# Patient Record
Sex: Female | Born: 1960 | Race: White | Hispanic: No | Marital: Married | Smoking: Never smoker
Health system: Southern US, Community
[De-identification: ages and names within clinical notes are randomized; demographics above are authoritative.]

## PROBLEM LIST (undated history)

## (undated) DIAGNOSIS — T4145XA Adverse effect of unspecified anesthetic, initial encounter: Secondary | ICD-10-CM

## (undated) DIAGNOSIS — Z8719 Personal history of other diseases of the digestive system: Secondary | ICD-10-CM

## (undated) DIAGNOSIS — K219 Gastro-esophageal reflux disease without esophagitis: Secondary | ICD-10-CM

## (undated) DIAGNOSIS — R112 Nausea with vomiting, unspecified: Secondary | ICD-10-CM

## (undated) DIAGNOSIS — J4520 Mild intermittent asthma, uncomplicated: Secondary | ICD-10-CM

## (undated) DIAGNOSIS — J189 Pneumonia, unspecified organism: Secondary | ICD-10-CM

## (undated) DIAGNOSIS — Z8489 Family history of other specified conditions: Secondary | ICD-10-CM

## (undated) DIAGNOSIS — K589 Irritable bowel syndrome without diarrhea: Secondary | ICD-10-CM

## (undated) DIAGNOSIS — J3089 Other allergic rhinitis: Secondary | ICD-10-CM

## (undated) DIAGNOSIS — N8189 Other female genital prolapse: Secondary | ICD-10-CM

## (undated) DIAGNOSIS — Z87442 Personal history of urinary calculi: Secondary | ICD-10-CM

## (undated) DIAGNOSIS — Z9889 Other specified postprocedural states: Secondary | ICD-10-CM

## (undated) DIAGNOSIS — E039 Hypothyroidism, unspecified: Secondary | ICD-10-CM

## (undated) DIAGNOSIS — Z973 Presence of spectacles and contact lenses: Secondary | ICD-10-CM

## (undated) DIAGNOSIS — IMO0002 Reserved for concepts with insufficient information to code with codable children: Secondary | ICD-10-CM

## (undated) DIAGNOSIS — T8859XA Other complications of anesthesia, initial encounter: Secondary | ICD-10-CM

## (undated) DIAGNOSIS — M1990 Unspecified osteoarthritis, unspecified site: Secondary | ICD-10-CM

## (undated) DIAGNOSIS — R51 Headache: Secondary | ICD-10-CM

## (undated) DIAGNOSIS — N3010 Interstitial cystitis (chronic) without hematuria: Secondary | ICD-10-CM

## (undated) HISTORY — PX: ABDOMINAL HYSTERECTOMY: SHX81

## (undated) HISTORY — PX: TOOTH EXTRACTION: SUR596

---

## 1986-02-19 HISTORY — PX: TUBAL LIGATION: SHX77

## 1997-07-21 ENCOUNTER — Other Ambulatory Visit: Admission: RE | Admit: 1997-07-21 | Discharge: 1997-07-21 | Payer: Self-pay | Admitting: Obstetrics and Gynecology

## 1998-08-01 ENCOUNTER — Other Ambulatory Visit: Admission: RE | Admit: 1998-08-01 | Discharge: 1998-08-01 | Payer: Self-pay | Admitting: Obstetrics and Gynecology

## 1998-11-11 ENCOUNTER — Emergency Department (HOSPITAL_COMMUNITY): Admission: EM | Admit: 1998-11-11 | Discharge: 1998-11-12 | Payer: Self-pay | Admitting: Emergency Medicine

## 1999-07-20 ENCOUNTER — Other Ambulatory Visit: Admission: RE | Admit: 1999-07-20 | Discharge: 1999-07-20 | Payer: Self-pay | Admitting: Obstetrics and Gynecology

## 1999-08-17 ENCOUNTER — Ambulatory Visit (HOSPITAL_COMMUNITY): Admission: RE | Admit: 1999-08-17 | Discharge: 1999-08-17 | Payer: Self-pay | Admitting: Neurosurgery

## 1999-08-17 ENCOUNTER — Encounter: Payer: Self-pay | Admitting: Neurosurgery

## 1999-10-11 ENCOUNTER — Encounter
Admission: RE | Admit: 1999-10-11 | Discharge: 2000-01-09 | Payer: Self-pay | Admitting: Physical Medicine & Rehabilitation

## 2000-01-18 ENCOUNTER — Encounter
Admission: RE | Admit: 2000-01-18 | Discharge: 2000-01-19 | Payer: Self-pay | Admitting: Physical Medicine & Rehabilitation

## 2000-06-26 ENCOUNTER — Encounter: Payer: Self-pay | Admitting: Family Medicine

## 2000-06-26 ENCOUNTER — Encounter: Admission: RE | Admit: 2000-06-26 | Discharge: 2000-06-26 | Payer: Self-pay | Admitting: Family Medicine

## 2000-07-10 ENCOUNTER — Encounter: Admission: RE | Admit: 2000-07-10 | Discharge: 2000-07-10 | Payer: Self-pay | Admitting: Family Medicine

## 2000-07-10 ENCOUNTER — Encounter: Payer: Self-pay | Admitting: Family Medicine

## 2000-07-24 ENCOUNTER — Other Ambulatory Visit: Admission: RE | Admit: 2000-07-24 | Discharge: 2000-07-24 | Payer: Self-pay | Admitting: Obstetrics and Gynecology

## 2000-08-26 ENCOUNTER — Ambulatory Visit (HOSPITAL_COMMUNITY)
Admission: RE | Admit: 2000-08-26 | Discharge: 2000-08-26 | Payer: Self-pay | Admitting: Physical Medicine & Rehabilitation

## 2000-08-27 ENCOUNTER — Ambulatory Visit (HOSPITAL_COMMUNITY)
Admission: RE | Admit: 2000-08-27 | Discharge: 2000-08-27 | Payer: Self-pay | Admitting: Physical Medicine & Rehabilitation

## 2000-08-27 ENCOUNTER — Encounter: Payer: Self-pay | Admitting: Physical Medicine & Rehabilitation

## 2000-09-10 ENCOUNTER — Ambulatory Visit (HOSPITAL_COMMUNITY)
Admission: RE | Admit: 2000-09-10 | Discharge: 2000-09-10 | Payer: Self-pay | Admitting: Physical Medicine & Rehabilitation

## 2000-09-10 ENCOUNTER — Encounter: Payer: Self-pay | Admitting: Physical Medicine & Rehabilitation

## 2000-11-11 ENCOUNTER — Ambulatory Visit (HOSPITAL_COMMUNITY)
Admission: RE | Admit: 2000-11-11 | Discharge: 2000-11-11 | Payer: Self-pay | Admitting: Physical Medicine & Rehabilitation

## 2000-11-11 ENCOUNTER — Encounter: Payer: Self-pay | Admitting: Physical Medicine & Rehabilitation

## 2001-09-11 ENCOUNTER — Encounter: Payer: Self-pay | Admitting: Gastroenterology

## 2001-09-11 ENCOUNTER — Encounter: Admission: RE | Admit: 2001-09-11 | Discharge: 2001-09-11 | Payer: Self-pay | Admitting: Gastroenterology

## 2001-09-16 ENCOUNTER — Ambulatory Visit (HOSPITAL_COMMUNITY): Admission: RE | Admit: 2001-09-16 | Discharge: 2001-09-16 | Payer: Self-pay | Admitting: Gastroenterology

## 2001-09-25 ENCOUNTER — Other Ambulatory Visit: Admission: RE | Admit: 2001-09-25 | Discharge: 2001-09-25 | Payer: Self-pay | Admitting: Obstetrics and Gynecology

## 2001-10-02 ENCOUNTER — Emergency Department (HOSPITAL_COMMUNITY): Admission: EM | Admit: 2001-10-02 | Discharge: 2001-10-02 | Payer: Self-pay | Admitting: Emergency Medicine

## 2001-10-07 ENCOUNTER — Encounter: Payer: Self-pay | Admitting: General Surgery

## 2001-10-10 ENCOUNTER — Ambulatory Visit (HOSPITAL_COMMUNITY): Admission: RE | Admit: 2001-10-10 | Discharge: 2001-10-11 | Payer: Self-pay | Admitting: General Surgery

## 2001-10-10 ENCOUNTER — Encounter: Payer: Self-pay | Admitting: General Surgery

## 2001-10-10 ENCOUNTER — Encounter (INDEPENDENT_AMBULATORY_CARE_PROVIDER_SITE_OTHER): Payer: Self-pay | Admitting: *Deleted

## 2001-10-11 HISTORY — PX: LAPAROSCOPIC CHOLECYSTECTOMY: SUR755

## 2002-03-14 ENCOUNTER — Encounter: Payer: Self-pay | Admitting: Emergency Medicine

## 2002-03-14 ENCOUNTER — Emergency Department (HOSPITAL_COMMUNITY): Admission: EM | Admit: 2002-03-14 | Discharge: 2002-03-14 | Payer: Self-pay | Admitting: Emergency Medicine

## 2002-05-06 ENCOUNTER — Ambulatory Visit (HOSPITAL_COMMUNITY): Admission: RE | Admit: 2002-05-06 | Discharge: 2002-05-06 | Payer: Self-pay | Admitting: Obstetrics and Gynecology

## 2002-05-06 ENCOUNTER — Encounter: Payer: Self-pay | Admitting: Obstetrics and Gynecology

## 2002-07-30 ENCOUNTER — Encounter: Payer: Self-pay | Admitting: Family Medicine

## 2002-07-30 ENCOUNTER — Encounter: Admission: RE | Admit: 2002-07-30 | Discharge: 2002-07-30 | Payer: Self-pay | Admitting: Family Medicine

## 2002-08-12 ENCOUNTER — Encounter: Admission: RE | Admit: 2002-08-12 | Discharge: 2002-11-10 | Payer: Self-pay | Admitting: Family Medicine

## 2003-02-17 ENCOUNTER — Emergency Department (HOSPITAL_COMMUNITY): Admission: EM | Admit: 2003-02-17 | Discharge: 2003-02-17 | Payer: Self-pay | Admitting: Emergency Medicine

## 2003-02-20 HISTORY — PX: FRACTURE SURGERY: SHX138

## 2003-04-08 ENCOUNTER — Other Ambulatory Visit: Admission: RE | Admit: 2003-04-08 | Discharge: 2003-04-08 | Payer: Self-pay | Admitting: Gynecology

## 2003-04-22 ENCOUNTER — Inpatient Hospital Stay (HOSPITAL_COMMUNITY): Admission: RE | Admit: 2003-04-22 | Discharge: 2003-04-24 | Payer: Self-pay | Admitting: Gynecology

## 2003-04-22 ENCOUNTER — Encounter (INDEPENDENT_AMBULATORY_CARE_PROVIDER_SITE_OTHER): Payer: Self-pay | Admitting: *Deleted

## 2003-04-22 HISTORY — PX: OTHER SURGICAL HISTORY: SHX169

## 2003-11-25 ENCOUNTER — Encounter: Admission: RE | Admit: 2003-11-25 | Discharge: 2003-11-25 | Payer: Self-pay | Admitting: Family Medicine

## 2004-05-15 ENCOUNTER — Other Ambulatory Visit: Admission: RE | Admit: 2004-05-15 | Discharge: 2004-05-15 | Payer: Self-pay | Admitting: Obstetrics and Gynecology

## 2004-11-02 ENCOUNTER — Encounter (INDEPENDENT_AMBULATORY_CARE_PROVIDER_SITE_OTHER): Payer: Self-pay | Admitting: Specialist

## 2004-11-02 ENCOUNTER — Observation Stay (HOSPITAL_COMMUNITY): Admission: RE | Admit: 2004-11-02 | Discharge: 2004-11-03 | Payer: Self-pay | Admitting: Obstetrics and Gynecology

## 2004-11-02 HISTORY — PX: LAPAROSCOPIC ASSISTED VAGINAL HYSTERECTOMY: SHX5398

## 2005-06-12 ENCOUNTER — Encounter: Admission: RE | Admit: 2005-06-12 | Discharge: 2005-06-12 | Payer: Self-pay | Admitting: Urology

## 2005-09-25 ENCOUNTER — Ambulatory Visit: Payer: Self-pay | Admitting: Internal Medicine

## 2005-10-19 ENCOUNTER — Ambulatory Visit (HOSPITAL_COMMUNITY): Admission: RE | Admit: 2005-10-19 | Discharge: 2005-10-19 | Payer: Self-pay | Admitting: Internal Medicine

## 2005-10-19 ENCOUNTER — Encounter (INDEPENDENT_AMBULATORY_CARE_PROVIDER_SITE_OTHER): Payer: Self-pay | Admitting: *Deleted

## 2005-10-19 HISTORY — PX: COLONOSCOPY: SHX174

## 2005-10-23 ENCOUNTER — Ambulatory Visit: Payer: Self-pay | Admitting: Internal Medicine

## 2005-12-10 ENCOUNTER — Ambulatory Visit: Payer: Self-pay | Admitting: Internal Medicine

## 2005-12-19 ENCOUNTER — Encounter: Payer: Self-pay | Admitting: Internal Medicine

## 2006-05-29 ENCOUNTER — Encounter: Admission: RE | Admit: 2006-05-29 | Discharge: 2006-05-29 | Payer: Self-pay | Admitting: Obstetrics and Gynecology

## 2006-06-19 ENCOUNTER — Ambulatory Visit: Payer: Self-pay | Admitting: Oncology

## 2007-05-30 ENCOUNTER — Encounter: Admission: RE | Admit: 2007-05-30 | Discharge: 2007-05-30 | Payer: Self-pay | Admitting: Obstetrics and Gynecology

## 2007-06-06 ENCOUNTER — Encounter: Admission: RE | Admit: 2007-06-06 | Discharge: 2007-06-06 | Payer: Self-pay | Admitting: Obstetrics and Gynecology

## 2008-06-16 ENCOUNTER — Encounter: Admission: RE | Admit: 2008-06-16 | Discharge: 2008-06-16 | Payer: Self-pay | Admitting: Obstetrics and Gynecology

## 2008-10-26 ENCOUNTER — Telehealth: Payer: Self-pay | Admitting: Internal Medicine

## 2010-02-17 ENCOUNTER — Emergency Department (HOSPITAL_BASED_OUTPATIENT_CLINIC_OR_DEPARTMENT_OTHER)
Admission: EM | Admit: 2010-02-17 | Discharge: 2010-02-17 | Payer: Self-pay | Source: Home / Self Care | Admitting: Emergency Medicine

## 2010-03-12 ENCOUNTER — Encounter: Payer: Self-pay | Admitting: Obstetrics and Gynecology

## 2010-03-23 NOTE — Progress Notes (Signed)
Summary: ? re col   Phone Note Call from Patient Call back at Home Phone [phone]   Caller: Patient Call For: Leone Payor Reason for Call: Talk to Nurse Summary of Call: Patient has questions regarding the last colon, she wants to know when was it done (it does not show in the system) Initial call taken by: Tawni Levy,  October 26, 2008 10:33 AM  Follow-up for Phone Call        patient notfied last colon was 12-19-2005. Follow-up by: Darcey Nora RN, CGRN,  October 26, 2008 11:18 AM

## 2010-03-23 NOTE — Procedures (Signed)
Summary: Colonoscopy   Colonoscopy  Procedure date:  12/19/2005  Findings:      Location:  [facility].    Procedures Next Due Date:    Colonoscopy: 12/2015 Patient Name: Deborah Callahan, Deborah Callahan MRN:  Procedure Procedures: Colonoscopy CPT: 16606.    with biopsy. CPT: Q5068410.  Personnel: Endoscopist: Iva Boop, MD, [facility]  Referred By: Marcelyn Bruins, MD.  Exam Location: Exam performed in Endoscopy Suite. Outpatient  Patient Consent: Procedure, Alternatives, Risks and Benefits discussed, consent obtained, from patient. Consent was obtained by the RN.  Indications Symptoms: Diarrhea Change in bowel habits. Rectal Bleeding.  History  Current Medications: Patient is not currently taking Coumadin.  Pre-Exam Physical: Performed Oct 19, 2005. Cardio-pulmonary exam WNL. Rectal exam abnormal. HEENT exam , Abdominal exam, Mental status exam WNL. Abnormal PE findings include: tender rectal exam.  Comments: Pt. history reviewed/updated, physical exam performed prior to initiation of sedation? Exam Exam: Extent of exam reached: Terminal Ileum, extent intended: Terminal Ileum.  The cecum was identified by appendiceal orifice and IC valve. Patient position: on left side. Colon retroflexion performed. Images taken. ASA Classification: I. Tolerance: good.  Monitoring: Pulse and BP monitoring, Oximetry used. Supplemental O2 given.  Colon Prep Prep results: excellent.  Fluoroscopy: Fluoroscopy was not used.  Sedation Meds: Patient assessed and found to be appropriate for moderate (conscious) sedation. Fentanyl 100 mcg. given IV. Versed 10 mg. given IV.  Findings NORMAL EXAM: Terminal Ileum. Biopsy/Normal Exam taken. Comments: nodules biopsies, suspct lymphoid hyperplasia (normal).  - NORMAL EXAM: Cecum to Sigmoid Colon. Comments: VERY SENSITIVE TO SCOPE IN SIGMOID.  HEMORRHOIDS: External. ICD9: Hemorrhoids, External: 455.3. Comments: OLD, NOT ACTIVE, NO OBVIOUS FISSURE.    Assessment  Diagnoses: 455.3: Hemorrhoids, External.   Comments: 1) NO POLYPS OR CANCER SEEN 2) STRONG IBS RESPONSE IN SIGMOID 3) OLD EXTERNAL HEMORRHOIDS  Events  Unplanned Interventions: No intervention was required.  Plans Patient Education: Patient given standard instructions for: Patient instructed to get routine colonoscopy every 10 years.  Comments: TRIAL OF DILTIAZEM GEL TO ANUS FOR RECTAL PAIN Disposition: After procedure patient sent to recovery.  Scheduling/Referral: Call office for appointment, to Iva Boop, MD, [facility], [address],

## 2010-04-10 ENCOUNTER — Other Ambulatory Visit: Payer: Self-pay | Admitting: Orthopedic Surgery

## 2010-04-10 DIAGNOSIS — M25539 Pain in unspecified wrist: Secondary | ICD-10-CM

## 2010-04-18 ENCOUNTER — Ambulatory Visit
Admission: RE | Admit: 2010-04-18 | Discharge: 2010-04-18 | Disposition: A | Discharge status: Home / Self Care | Payer: BC Managed Care – PPO | Source: Ambulatory Visit | Attending: Orthopedic Surgery | Admitting: Orthopedic Surgery

## 2010-04-18 DIAGNOSIS — M25539 Pain in unspecified wrist: Secondary | ICD-10-CM

## 2010-05-01 LAB — BASIC METABOLIC PANEL
BUN: 10 mg/dL (ref 6–23)
CO2: 25 mEq/L (ref 19–32)
Calcium: 9.1 mg/dL (ref 8.4–10.5)
Chloride: 104 mEq/L (ref 96–112)
Creatinine, Ser: 0.6 mg/dL (ref 0.4–1.2)
GFR calc Af Amer: >60 mL/min (ref >60)
GFR calc non Af Amer: >60 mL/min (ref >60)
Glucose, Bld: 122 mg/dL — ABNORMAL HIGH (ref 70–99)
Potassium: 4.2 mEq/L (ref 3.5–5.1)
Sodium: 141 mEq/L (ref 135–145)

## 2010-05-01 LAB — CBC
HCT: 42.5 % (ref 36.0–46.0)
Hemoglobin: 14.7 g/dL (ref 12.0–15.0)
MCH: 31.3 pg (ref 26.0–34.0)
MCHC: 34.6 g/dL (ref 30.0–36.0)
MCV: 90.4 fL (ref 78.0–100.0)
Platelets: 212 10*3/uL (ref 150–400)
RBC: 4.7 MIL/uL (ref 3.87–5.11)
RDW: 11.7 % (ref 11.5–15.5)
WBC: 5.2 10*3/uL (ref 4.0–10.5)

## 2010-05-01 LAB — URINALYSIS, ROUTINE W REFLEX MICROSCOPIC
Bilirubin Urine: NEGATIVE
Glucose, UA: NEGATIVE mg/dL
Hgb urine dipstick: NEGATIVE
Ketones, ur: NEGATIVE mg/dL
Nitrite: NEGATIVE
Protein, ur: NEGATIVE mg/dL
Specific Gravity, Urine: 1.021 (ref 1.005–1.030)
Urobilinogen, UA: 0.2 mg/dL (ref 0.0–1.0)
pH: 5.5 (ref 5.0–8.0)

## 2010-05-01 LAB — DIFFERENTIAL
Basophils Absolute: 0 10*3/uL (ref 0.0–0.1)
Basophils Relative: 0 % (ref 0–1)
Eosinophils Absolute: 0.1 10*3/uL (ref 0.0–0.7)
Eosinophils Relative: 1 % (ref 0–5)
Lymphocytes Relative: 13 % (ref 12–46)
Lymphs Abs: 0.7 10*3/uL (ref 0.7–4.0)
Monocytes Absolute: 0.6 10*3/uL (ref 0.1–1.0)
Monocytes Relative: 12 % (ref 3–12)
Neutro Abs: 3.8 10*3/uL (ref 1.7–7.7)
Neutrophils Relative %: 73 % (ref 43–77)

## 2010-07-07 NOTE — Op Note (Signed)
   NAME:  Deborah Callahan, Deborah Callahan                           ACCOUNT NO.:  [id number]   MEDICAL RECORD NO.:  [id number]                   PATIENT TYPE:  AMB   LOCATION:  ENDO                                 FACILITY:  [facility]   PHYSICIAN:  Charna Detra, M.D.                   DATE OF BIRTH:  1960/11/20   DATE OF PROCEDURE:  09/16/2001  DATE OF DISCHARGE:                                 OPERATIVE REPORT   PROCEDURE PERFORMED:  Esophagogastroduodenoscopy.   ENDOSCOPIST:  Charna Gavriella, M.D.   INSTRUMENT USED:  Olympus video panendoscope.   INDICATIONS FOR PROCEDURE:  A 50 year old white female with a history of  severe epigastric pain and borderline HIDA scan, rule out peptic ulcer  disease, esophagitis, etc. before referring the patient for a laparoscopic  cholecystectomy.   PREPROCEDURE PREPARATION:  Informed consent was procured from the patient.  The patient fasted for eight hours prior to the procedure.   PREPROCEDURE PHYSICAL EXAMINATION:  VITAL SIGNS:  The patient with stable vital signs.   NECK:  Supple.   CHEST:  Clear to auscultation, S1, S2, regular.   ABDOMEN:  Soft with normal bowel sounds.   DESCRIPTION OF PROCEDURE:  The patient was placed in the left lateral  decubitus position, sedated with Demerol and Versed.  Once the patient was  adequately sedated and maintained on low-flow oxygen with continuous cardiac  monitoring, the Olympus video panendoscope was advanced through the  mouthpiece over the tongue into the esophagus under direct vision.  The  entire esophagus appeared healthy with no evidence of ring stricture,  Michaels esophagitis, or Barrett's mucosa.  The scope was then advanced into  the stomach.  A small hiatal hernia was seen on high retroflexion.  The rest  of the gastric mucosa in the proximal small bowel appeared normal.   IMPRESSION:  Normal esophagogastroduodenoscopy except for a small hiatal  hernia.    RECOMMENDATIONS:  1. Continue PPIs for now.  2.  CT scan of the abdomen and pelvis before referring to the surgeon for     laparoscopic cholecystectomy.  3. Outpatient follow-up in the next two weeks.                                               Charna Margy, M.D.    JM/MEDQ  D:  09/16/2001  T:  09/19/2001  Job:  [id number]   cc:   Teena Irani. Arlyce Dice, M.D.

## 2010-07-07 NOTE — Op Note (Signed)
NAME:  Deborah Callahan, Deborah Callahan NO.:  [id number]   MEDICAL RECORD NO.:  [id number]          PATIENT TYPE:  OBV   LOCATION:  9399                          FACILITY:  [facility]   PHYSICIAN:  Juluis Mire, M.D.   DATE OF BIRTH:  Nov 01, 1960   DATE OF PROCEDURE:  11/02/2004  DATE OF DISCHARGE:                                 OPERATIVE REPORT   PREOPERATIVE DIAGNOSES:  1.  Menorrhagia.  2.  Pelvic pain with dyspareunia.  3.  Possible adenomyosis.  4.  Posterior vaginal wall scarring.   POSTOPERATIVE DIAGNOSES:  1.  Menorrhagia.  2.  Pelvic pain with dyspareunia.  3.  Possible adenomyosis.  4.  Posterior vaginal wall scarring.   OPERATIVE PROCEDURE:  Laparoscopically-assisted vaginal hysterectomy with  release of posterior vaginal scar.   SURGEON:  Juluis Mire, M.D.   ASSISTANT:  Guy Sandifer. Henderson Cloud, M.D.   ANESTHESIA:  General endotracheal.   ESTIMATED BLOOD LOSS:  300-400 mL.   PACKS AND DRAINS:  None.   INTRAOPERATIVE BLOOD REPLACED:  None.   COMPLICATIONS:  None.   INDICATIONS:  As dictated in the history and physical.   PROCEDURE:  The patient was taken to the OR and placed in the supine  position.  After a satisfactory level of general endotracheal anesthesia  obtained, the patient was placed in the dorsal lithotomy position using the  Allen stirrups.  PAS stockings were in place.  At this point in time, the  abdomen, perineum and vagina were prepped out with Betadine.  The bladder  was emptied by in-and-out catheterization.  A Hulka tenaculum was put in  place and secured.  The patient was then draped in a sterile field.  A  subumbilical incision made with a knife.  The Veress needle was introduced  into the abdominal cavity.  The abdomen was inflated with approximately 4 L  of carbon dioxide.  The operating laparoscope was then introduced.  There  was no evidence of injury to adjacent organs.  A 5 mm trocar was put in  place in the suprapubic area.   Visualization revealed some pericecal  scarring.  The upper abdomen including the liver and tip of the gallbladder  were unremarkable.  The uterus, tubes, and ovaries were unremarkable.  At  this point in time using the Gyrus bipolar, we first went to the right  adnexa, where the right utero-ovarian pedicle was cauterized, incised.  The  right round ligament was cauterized and incised, and we continued that  process down the broad ligament.  We then went to the left side where the  left utero-ovarian pedicle was cauterized and incised, the left round  ligament was cauterized and incised, and we extended that process down the  broad ligament.  It was of note she had had a previous bilateral tubal  ligation.  At this point in time we had good release of the uterus  bilaterally.  The decision was to go vaginally.   The laparoscope was removed.  The abdomen was deflated of its carbon  dioxide.  The patient's legs were repositioned.  On exam she had a distinct  scar between the vaginal apex and the cervix that held the cervix down  tightly to it, and there was a ridge.  We grasped the cervix with a Jacobs  tenaculum and elevated it.  You could identify the scarred ridge from her  prior posterior repair and enterocele repair.  Using the scissors, we cut  across this and dissected the vaginal mucosa off of it inferiorly.  We then  identified the cul-de-sac, entered it sharply.  A weighted speculum was put  in place.  The uterosacral ligaments were clamped, cut, and suture ligated  with 0 Vicryl.  The reflection of the vaginal mucosa anteriorly was incised  and the bladder was dissected superiorly.  The paracervical tissue was  clamped, cut, and suture ligated with 0 Vicryl.  Next, using the clamp, cut  and tie technique with suture ligature of 0 Vicryl, the parametrium was  serially separated from the sides of the uterus.  The vesicouterine space  was then entered.  The uterus was then flipped.   Held pedicles were clamped  and cut and the uterus and cervix was passed off the operative field.  Held  pedicles were secured with free ties of 0 Vicryl.  At this point in time I  went back posteriorly.  It felt as if that the scarring had been taken down  adequately as we took out the uterus.  We therefore did not do a uterosacral  plication stitch.  We reapproximated the vaginal mucosa with interrupted  figure-of-eights in a vertical fashion.  At the end of the procedure we had  good support posteriorly.  We also had freeing up of the scarring.  There  was no evidence of any tension to close examination of the vagina.  A sponge  on a sponge stick was placed in the vaginal vault.  A Foley was placed to  straight drain with retrieval of an adequate amount of clear urine.  The  laparoscope was reintroduced.  The abdomen was deflated of its carbon  dioxide.  We irrigated the pelvis.  Some small oozing area was noted at the  uterosacral ligaments, cauterized using the Gyrus.  At this point we had  good hemostasis at both ovarian areas and the vaginal cuff.  The abdomen was  deflated of its carbon dioxide, all trocars removed, the subumbilical  incision closed with interrupted subcuticulars of 4-0 Vicryl, suprapubic  incision was closed with Dermabond.  The sponge on a sponge stick was  removed from the vaginal vault.  Urine output remained clear and adequate.  The patient was taken out of the dorsal lithotomy position, once alert and  extubated transferred to the recovery room in good condition.  Sponge,  instrument and needle count reported as correct by circulating nurse x2.      Juluis Mire, M.D.  Electronically Signed     JSM/MEDQ  D:  11/02/2004  T:  11/02/2004  Job:  [id number]

## 2010-07-07 NOTE — Assessment & Plan Note (Signed)
[facility]                           GASTROENTEROLOGY OFFICE NOTE   NAME:Callahan, Deborah Callahan                        MRN:          [id number]  DATE:09/25/2005                            DOB:          [birth year]    REQUESTING PHYSICIAN:  Dr. Marcelyn Bruins.   REASON FOR CONSULTATION:  Change in bowels, rectal bleeding, abdominal pain.   ASSESSMENT:  [age]-year-old white woman with interstitial cystitis, who  has had problems with alternating bowel habits, tending more towards loose.  She has also had rectal bleeding, left lower quadrant pain and pencil-thin  stools.  She has very painful defecation at times as well.   Irritable bowel syndrome with pain and rectal bleeding sounds most likely,  given that she has interstitial cystitis, but these signs and symptoms could  represent gastrointestinal malignancy or diverticular disease or some  combination of all of the above.   RECOMMENDATIONS AND PLAN:  1.  Trial of Levbid.  2.  Colonoscopy.  3.  Fiber supplementation.  4.  Irritable bowel handout is given to the patient.  5.  Consider heat to the rectal area, avoiding Sitz baths due to her      interstitial cystitis, versus using the shower head to provide heat      there as well.  6.  Consider diltiazem gel to the anal area.  Though she does not seem to      have a fissure, she does seem to have some increased muscle tone on      digital rectal exam.  Question if there is some element of pelvic floor      dysfunction, though I did not see any abnormal perineal descent during      Valsalva maneuver.   I have explained the risks, benefits and indications of colonoscopy; she  understands and agrees to proceed.   HISTORY:  This is a pleasant 50 year old white woman who has had problems  for some time now related to pain with defecation, urgent defecation at  times and increased gastrocolic reflex at times.  She has crampy left lower  quadrant pain  and stools have been thin and she has had bright red blood  with this at times.  She has some vague early satiety as well, though she  has gained 9 pounds in the last month.  She has lactose intolerance problems  as well and has to avoid milk products.  These problems seem to begin or at  least were exacerbated after she had bladder suspicion surgery in 2006; I  believe she had a hysterectomy at that time as well.  She was quite  constipated then and Dr. Arelia Sneddon (he did not do her surgery) prescribed  GlycoLax.  She has not had to use that that much now, as she is more loose  rather than hard or constipated.  The surgery she had was a hysterectomy  with resection of an endometrial polyp, posterior and enterocele repairs  with a cardinal uterosacral colposuspension and a perineal body repair; that  was on April 22, 2003.  Subsequently, on November 02, 2004,  she had a  laparoscopically assisted vaginal hysterectomy with release of a posterior  vaginal scar by Drs. McComb and  Tomblin.  I think things have been somewhat  better since then.  She had had severe pelvic pain with deep dyspareunia  secondary to the vaginal scarring.  She also had an upper GI endoscopy  performed in July 2003 by Dr. Anselmo Rod, which was normal, except for  a small hiatal hernia.  She had CT scanning of the abdomen and pelvis back  then as well and more recently she had an MRI of the pelvis without and with  contrast in April of 2007, which showed no urethral diverticulum or  inflammation and a suspected physiologic cyst of the right ovary.  An  ultrasound of the abdomen in 2003 had been normal.   Additional past medical history includes her interstitial cystitis,  cholecystectomy in 2003 by Dr. Abbey Chatters.  Intraoperative cholangiogram was  negative.   MEDICATIONS:  1.  Pyridium p.r.n.  2.  Prevacid 30 mg daily.  3.  Naprosyn p.r.n.   DRUG ALLERGIES:  CODEINE.   FAMILY HISTORY:  Mother had endometrial  carcinoma, diabetes in some second-  degree relatives.  Father had heart disease.  Sister, uncle, father and  brother have had colon polyps.  Her mother had suffered a perforation at  colonoscopy.   SOCIAL HISTORY:  She is married.  She is here with her husband.  She is a  Naval architect at [employer].  No alcohol, tobacco or drugs.   REVIEW OF SYSTEMS:  See my medical history form for full details.   PHYSICAL EXAMINATION:  GENERAL:  Physical exam reveals a pleasant, well-  developed white woman.  VITAL SIGNS:  Height 5 feet 7, weight 139 pounds.  Blood pressure 98/62,  pulse 68.  EYES:  Anicteric.  ENT:  Normal mouth and oropharynx.  NECK:  Supple without thyromegaly or mass.  CHEST:  Clear.  HEART:  S1 and S2, no murmurs, rubs or gallops.  ABDOMEN:  She has some mild to moderate left lower quadrant and right lower  quadrant tenderness, though the abdomen is soft and there is no guarding.  There is no organomegaly or mass.  RECTAL:  Exam with female nursing staff present shows slightly increased  resting tone in my estimation with a very good squeeze, no abnormal descent,  no mass, no obvious rectocele.  It is minimally tender, through she  describes a running sensation as the digit is inserted.  There is no  evidence for fissure.  EXTREMITIES:  Without edema.  SKIN:  Tanned, warm and dry without rash noted.  NEUROLOGIC:  She is alert and oriented x3.  PSYCHIATRIC:  Appropriate affect.   DATA:  Additional data show labs from [facility] with a  urinalysis negative on September 11, 2005, sed rate 12 on September 11, 2005, CBC  normal on September 11, 2005, CMET normal on September 11, 2005, a C. difficile toxin  negative on September 11, 2005, stool cultures negative on September 11, 2005.   COMMENT:  I appreciate the opportunity to care for this patient.                                   Iva Boop, MD, [facility]   CEG/MedQ DD:  09/25/2005  DT:  09/26/2005  Job #:   [id number]   cc:  Jamison Neighbor, MD  Juluis Mire, MD  [facility]

## 2010-07-07 NOTE — Discharge Summary (Signed)
NAME:  CONTINA, STRAIN                           ACCOUNT NO.:  [id number]   MEDICAL RECORD NO.:  [id number]                   PATIENT TYPE:  INP   LOCATION:  3329                                 FACILITY:  [facility]   PHYSICIAN:  Gretta Cool, M.D.              DATE OF BIRTH:  09-11-1960   DATE OF ADMISSION:  04/22/2003  DATE OF DISCHARGE:  04/24/2003                                 DISCHARGE SUMMARY   HISTORY OF PRESENT ILLNESS:  Ms. Westendorf is a 50 year old white female, gravida  2, para 2, admitted for resection of a large endometrial polyp which is  thought to be causing her menorrhagia.  She has exceedingly poor posterior  pelvic support with enterocele, rectocele, fascial detachment and perineal  body loss.  She will also have definitive therapy for removal of the polyp  as well as posterior and enterocele repairs.  She reports heavy menses  lasting 8 days, 28 day interval.  She complains of excessive PMS symptoms  and occasional hot flashes.  She also reports back pain and pelvic pressure,  like things are falling out with prolonged standing.  She has difficulty  with evacuation of her bowels due to support weakness and large prolapse of  the rectum and enterocele.   ADMISSION EXAM:  CHEST:  Clear to A&P.  HEART:  Rate and rhythm without murmur, gallop, or cardiac enlargement.  ABDOMEN:  Soft and scaphoid without masses or organomegaly.  PELVIC:  External genitalia within normal limits.  Female vagina is clean  and rugose.  She has an enormous enterocele and rectocele that bulges  through the introitus, grade 3-4.  There is severe fascial detachment and  __________ sublevator ani group.  Her anterior support is reasonably  preserved.  The cervix distends marginally with straining uterus that is  normal shape, size, and contour.  Adnexa bilaterally clear.  Rectovaginal  exam confirms.   Ultrasound exam confirms the large polyp in the center of the cavity.   IMPRESSION:  1.  Abnormal uterine bleeding with endometrial polyp versus fibroid less     likely.  2. Pelvic organ prolapse with severe rectocele, enterocele, fascial     detachment, levator diastasis and loss of perineal body.  3. Strong family history of early coronary disease, dyslipidemia, and     hypertension.   PLAN:  1. Hysteroscopy.  2. Resection of endometrial polyp.  3. Posterior enterocele repair and colposuspension and repair of the     detachment and perineal body under general anesthesia.   Risks and benefits were discussed with the patient, and she accepts these  procedure.   LABORATORY DATA:  Admission hemoglobin 13.7, hematocrit 40.5.  On the first  postoperative day, hemoglobin was 11.7, hematocrit 34.2.  Pathology report:  Endometrial resection benign secretory endometrium without hyperplasia or  evidence of malignancy, portion of the myometrium.  Benign enterocele sac  with no atypia.  Final  diagnosis on pathology report was amended to features  consistent with endometrial polyp with superimposed secretory change, benign  secretory endometrium without hyperplasia or evidence of malignancy.  Portions of benign myometrium.   HOSPITAL COURSE:  The patient underwent hysteroscopy, resection of  endometrial polyp, posterior enterocele was repaired with cardinal uterine  colposuspension and repair of perineal body under general anesthesia.  The  procedures were completed without any complications, and the patient was  returned to the recovery room in excellent condition.  Her postoperative  course was complicated on the day of surgery with much nausea and reasonable  pain relief.  On the first postoperative day, she was unable to void and  reported much bladder pain.  In and out catheter was done, and the catheter  was not left in.  She voided well and was discharged on the second  postoperative day in excellent condition.  Final discharge instructions  included no heavy lifting or  straining, no vaginal entrance, and to increase  ambulation as tolerated.  She is to call for any fever of over 100.4 or  failure of daily improvement.   DIET:  Regular.   MEDICATIONS:  1. __________ 10 mg daily.  2. Tylox 1 p.o. q.4-6h. p.r.n. discomfort.   She is to return to the office in 1 week for follow-up.   CONDITION ON DISCHARGE:  Excellent.   FINAL DISCHARGE DIAGNOSES:  1. Endometrial polyp with abnormal uterine bleeding.  2. Severe pelvic organ prolapse with grade 4 rectocele and enterocele with     detachment of the perirectal fascia from the cervix with perineal body     loss.   PROCEDURES PERFORMED:  1. Hysterotomy and resection of endometrial polyp.  2. Posterior and enterocele repair with cardinal uterine sacral     colposuspension.  3. Repair of perineal body under general anesthesia.     Matt Holmes, N.P.                          Gretta Cool, M.D.    EMK/MEDQ  D:  05/31/2003  T:  05/31/2003  Job:  [id number]

## 2010-07-07 NOTE — H&P (Signed)
NAME:  Deborah Callahan, Deborah Callahan                           ACCOUNT NO.:  [id number]   MEDICAL RECORD NO.:  [id number]                   PATIENT TYPE:  INP   LOCATION:  NA                                   FACILITY:  [facility]   PHYSICIAN:  Gretta Cool, M.D.              DATE OF BIRTH:  01-Sep-1960   DATE OF ADMISSION:  DATE OF DISCHARGE:                                HISTORY & PHYSICAL   Deborah Callahan is a 50 year old white female, gravida 2, para 2, admitted for  resection for a large endometrial polyp for management of her menorrhagia.  She also has exceedingly posterior pelvic support with enterocele,  rectocele, fascial detachment, and perineal body loss. She is now admitted  for definitive therapy by hysteroscopy for removal of the polyp and  posterior and enterocele repairs.   HISTORY OF PRESENT ILLNESS:  Deborah Callahan relates a history of increasingly  heavy menses now lasting eight days, 28 day intervals persist. She also  complains of excess PMS symptoms and occasional hot flashes. She has  symptoms of pelvic support weakness with back pain and pelvic pressure like  things are falling out with prolonged standing. She has difficulty with  evacuation of her bowel because of the pelvic support weakness and large  prolapse of rectum and enterocele. She is now admitted for definitive  therapy by surgery as above.   PAST MEDICAL HISTORY:  Usual childhood disease without sequela. Medical  illnesses: Migraine headaches for which she receives Demerol injections in  the emergency room on occasion. Primary care is [facility].  She also has a history of hiatal hernia and interstitial cystitis. She is on  Elmiron, Protonix, and Demerol intermittently. Previous surgery: Tubal  ligation in 1988, cholecystectomy by [facility] surgeons, history of  colonoscopy and endoscopy by Dr. Loreta Ave.   FAMILY HISTORY:  Father died at age 28 of a heart attack and heavy tobacco  use. Mother is 6,  living and well. Sister and two brothers living and well.   SOCIAL HISTORY:  The patient is a Architectural technologist at [employer]. Her husband is self-employed, a Firefighter. They have two  children both living at home.   REVIEW OF SYSTEMS:  HEENT: Denies symptoms. CARDIORESPIRATORY: Denies  asthma, cough, bronchitis, or shortness of breath. GI/GU: Interstitial  cystitis on Elmiron and reasonably well controlled. Abnormal uterine  bleeding as above.   PHYSICAL EXAMINATION:  GENERAL: A well-developed, well-nourished white  female.  HEENT: Pupils equal  and reactive to light and accommodation. Fundi not  examined. Oropharynx clear.  NECK: Supple without mass or thyroid enlargement.  CHEST: Clear to P&A.  HEART: Regular rhythm without murmur or cardiac enlargement.  BREASTS: Without mass. No nipple discharge.  ABDOMEN: Soft, scaphoid without mass or organomegaly.  PELVIC EXAM: External genitalia--normal female vagina, clean, rugous. She  has an enormous enterocele and rectocele that bulges through the  introitus,  grade 3-4. She has a severe fascial detachment and wide diastasis of the  levator ani muscle group. Her anterior support is reasonably well preserved.  The cervix descends marginally with straining. The uterus is normal size,  shape, and contour. Adnexa clear. Rectovaginal confirms.   Ultrasound exam confirms a large polyp in the center of the cavity versus  fibroid.   IMPRESSION:  1. Abnormal uterine bleeding with endometrial polyp versus fibroid.  2. Pelvic organ prolapse with severe rectocele, enterocele, fascial     detachment, levator diastasis, and loss of perineal body.  3. Strong family history of early coronary disease, dyslipidemia, and     hypertension.   PLAN:  Hysteroscopy, resection of endometrial polyp, posterior enterocele  repair and colposuspension, repair of attachment and perineal body.                                                Gretta Cool, M.D.    CWL/MEDQ  D:  04/21/2003  T:  04/22/2003  Job:  [id number]   cc:   [facility]

## 2010-07-07 NOTE — H&P (Signed)
NAME:  Deborah Callahan, Deborah Callahan NO.:  [id number]   MEDICAL RECORD NO.:  [id number]          PATIENT TYPE:  AMB   LOCATION:  SDC                           FACILITY:  [facility]   PHYSICIAN:  Juluis Mire, M.D.   DATE OF BIRTH:  17-Dec-1960   DATE OF ADMISSION:  11/02/2004  DATE OF DISCHARGE:                                HISTORY & PHYSICAL   The patient is a 50 year old gravida 2, para 2, female who presents for a  laparoscopically-assisted vaginal hysterectomy.  In relation to the present  surgery, the patient has been having trouble with increasing pain with  intercourse.  She does have a longstanding history of chronic pelvic pain  and dysmenorrhea.  She underwent laparoscopy in January 2004 with findings  suggestive of uterine adenomyosis.  Because of continued pain scenario, she  was referred for urological consultation.  She was seen by Dr. Logan Bores and  being actively managed for interstitial cystitis.  Subsequently she was seen  by Gretta Cool, M.D., in February 2005.  It was felt at that time that  she had pelvic organ prolapse with a severe enterocele and rectocele.  She  also had an endometrial polyp that was noted on ultrasound.  In March 2005,  she underwent a hysteroscopic resection of the polyp, which was benign.  She  subsequently had a posterior repair and reduction of enterocele.  She has  had problems with worsening pain during deep penetration since that surgery.  It has been noted on exam that the cervix is scarred posteriorly to the  vagina, and this does seem to be the site of pain.  We had discussed  options, including release of the scar and reconstruction.  Because of the  patient's longstanding concern, she wishes to undergo laparoscopically-  assisted vaginal hysterectomy for management of this condition.  She does  understand that this surgical management may not result in complete removal  of pain during intercourse.  There is still a potential risk  of persistent  discomfort.   In terms of allergies, allergic to CODEINE.   MEDICATIONS:  Darvocet, Naprosyn, Elmiron.   PAST MEDICAL HISTORY:  Significant in that she does have a history of  interstitial cystitis being actively managed by Dr. Logan Bores.   PAST SURGICAL HISTORY:  She has had a rotator cuff repair.  She has had a  laparoscopic cholecystectomy.  She has had previous diagnostic laparoscopy.  She had laparoscopy in 1989 with bilateral tubal ligation and then last year  had the noted hysteroscopy and posterior repair.   OBSTETRICAL HISTORY:  Two vaginal deliveries.   FAMILY HISTORY:  Father died with an MI at 22 and had a history of  hypertension.  Mother with a history of hypertension as well as elevated  cholesterol.  She has a paternal uncle with hypertension.  Maternal  grandfather died from diabetes and congestive heart failure.  Maternal  grandmother died from breast cancer and resultant leukemia.   SOCIAL HISTORY:  No tobacco or alcohol use.   REVIEW OF SYSTEMS:  Noncontributory.   PHYSICAL EXAMINATION:  VITAL SIGNS:  The patient is afebrile with stable  vital signs.  HEENT:  Patient normocephalic.  Pupils equal, round, and reactive to light  and accommodation.  Extraocular movements were intact.  Sclerae and  conjunctivae clear.  Oropharynx clear.  NECK:  Without thyromegaly.  BREASTS:  No discrete masses.  CHEST:  Lungs clear.  CARDIAC:  Regular rhythm and rate without murmurs or gallops.  ABDOMEN:  Benign.  There is no mass, organomegaly or tenderness.  PELVIC:  Normal external genitalia.  Vaginal mucosa is relatively clear.  She has a marked tightening and scarring from the posterior cuff to the  posterior aspect of the cervix that tends to be the area of pain.  The  cervix is unremarkable.  Does have marked bladder tenderness.  The uterus  feels to be upper limits of normal size.  Again, it is hard to tell the  tenderness of the bladder from tenderness of  the uterus.  Adnexa  unremarkable.  Rectovaginal exam is clear.  EXTREMITIES:  Trace edema.  NEUROLOGIC:  Grossly within normal limits.   IMPRESSION:  1.  Increasing deep dyspareunia possibly secondary to adenomyosis and      resultant scarring from prior posterior repair.  2.  Interstitial cystitis.   PLAN:  The patient will undergo laparoscopically-assisted vaginal  hysterectomy.  Risks of surgery have been discussed including the risk of  infection; the risk of hemorrhage that could require transfusion with the  risk of AIDS or hepatitis; the risk of injury to adjacent organs including  bladder, bowel or ureters; risk of injury to adjacent organs such as  bladder, bowel or ureters, that could require further exploratory surgery;  risk of deep venous thrombosis and pulmonary embolus.  She does understand,  again, that this surgical management may not result in complete resolution  of pain during intercourse.      Juluis Mire, M.D.  Electronically Signed     JSM/MEDQ  D:  11/02/2004  T:  11/02/2004  Job:  [id number]

## 2010-07-07 NOTE — Op Note (Signed)
NAME:  Deborah Callahan, KLUGE                           ACCOUNT NO.:  [id number]   MEDICAL RECORD NO.:  [id number]                   PATIENT TYPE:  OIB   LOCATION:  5732                                 FACILITY:  [facility]   PHYSICIAN:  Adolph Pollack, M.D.            DATE OF BIRTH:  11-06-1960   DATE OF PROCEDURE:  DATE OF DISCHARGE:  10/11/2001                                 OPERATIVE REPORT   PREOPERATIVE DIAGNOSIS:  Chronic cholecystitis.   POSTOPERATIVE DIAGNOSIS:  Chronic cholecystitis.   PROCEDURE:  Laparoscopic cholecystectomy with intraoperative cholangiogram.   SURGEON:  Adolph Pollack, M.D.   ASSISTANT:  Gita Kudo, M.D.   ANESTHESIA:  General.   INDICATIONS:  The patient is a 50 year old female who has been having  postprandial epigastric right upper quadrant pain with nausea.  She has had  a fairly significant workup, including a cardiac workup that was negative.  Abdominal ultrasound was normal, nuclear medicine hepatobiliary scan with  CCK injection which was normal with normal overall ejection fraction.  However, she continues to have fairly classic biliary colic-type pain.  She  was placed on a proton pump inhibitor, but her symptoms have remained.  She  was sent to me by Dr. Loreta Ave and she was started on [place]; however, this did  not help her symptoms, which we thought could be secondary to irritable  bowel syndrome.  I had a very long discussion with her regarding the  cholecystectomy, which she desires.  I told her that it may not help her  symptoms but there is a small chance that it may, and she wishes to proceed.  The procedure and risks were explained to her preoperatively.   DESCRIPTION OF PROCEDURE:  She was placed supine on the operating table and  a general anesthetic was administered.  Her abdomen was sterilely prepped  and draped.  Local anesthetic was infiltrated in the subumbilical region and  a small subumbilical incision made in the skin  and subcutaneous tissue down  to the fascia, where a small incision was made in the fascia and the  peritoneal cavity was entered bluntly and under direct vision.  A Hasson  trocar was introduced into the peritoneal cavity and a pneumoperitoneum  created by CO2 gas.  Next a laparoscope was introduced.  I scanned the  abdomen briefly and did not notice any significant abnormalities on the  surface of the liver or on the surface of the intestines.   Next she was placed in appropriate position and an 11 mm trocar was placed  through a similar-sized incision in the epigastrium and two 5 mm trocars  placed in the right lateral abdomen.  The fundus of the gallbladder was  grasped, and no acute inflammatory changes were noted.  The fundus was  retracted to the right shoulder and the infundibulum pulled laterally.  Using careful blunt dissection, I isolated the cystic  duct, then I used  sharp dissection to mobilize the infundibulum and create a window around the  cystic duct and a window around an anterior branch of the cystic artery.  I  clipped the anterior branch of the cystic artery and divided it.  I  subsequently placed a clip at the neck of the gallbladder.  A small incision  was made at the cystic duct-gallbladder junction, and a cholangiocatheter  was placed into the cystic duct and a cholangiogram performed.   Under real-time fluoroscopy, dilute contrast material was injected into the  cystic duct.  The cystic duct appeared to be of moderate length.  The common  bile duct filled quickly and the contrast went into the duodenum quickly  without obvious evidence of obstruction.  The common and right and left  hepatic ducts also appeared patent.  Final report is pending the  radiologist's interpretation.   The cholangiocatheter was removed, and the cystic duct was clipped three  times proximally, then divided.  Posterior branch of the cystic artery was  clipped and divided.  The  gallbladder was then dissected free from the liver  bed with the cautery.  Two small puncture wounds were made in the  gallbladder and a small amount of bile was spilled.  Once the gallbladder  was removed from the liver bed, it was placed in an Endopouch bag.   I inspected the liver bed and noted some raw surface there, which I  cauterized.  I irrigated it and noted no bleeding or bile leakage.  However,  because of the raw surface I placed a piece of Surgicel in the gallbladder  fossa.  I then copiously irrigated the perihepatic area with a liter of  normal saline and evacuated it.  At the end of the irrigation, the  irrigation was clear.  I then removed the gallbladder through the  subumbilical port and closed the fascial defect with a 0 Vicryl pursestring  suture.  The remaining trocars were removed, and the pneumoperitoneum was  released.  The skin incisions were closed with 4-0 Monocryl subcuticular  stitches, followed by Steri-Strips and sterile dressings.   She tolerated the procedure well without any apparent complications and was  taken to the recovery room in satisfactory condition.                                               Adolph Pollack, M.D.    Kari Baars  D:  10/10/2001  T:  10/14/2001  Job:  [id number]   cc:   Charna Camila, M.D.   Teena Irani. Arlyce Dice, M.D.

## 2010-07-07 NOTE — Op Note (Signed)
NAME:  Deborah Callahan, Deborah Callahan                           ACCOUNT NO.:  [id number]   MEDICAL RECORD NO.:  [id number]                   PATIENT TYPE:  INP   LOCATION:  0001                                 FACILITY:  [facility]   PHYSICIAN:  Gretta Cool, M.D.              DATE OF BIRTH:  1960-02-28   DATE OF PROCEDURE:  DATE OF DISCHARGE:                                 OPERATIVE REPORT   PREOPERATIVE DIAGNOSES:  1. Endometrial polyp with abnormal uterine bleeding.  2. Severe pelvic organ prolapse with grade 4 rectocele/enterocele, with     detachment of the perirectal fascia from the cervix with perineal body     loss.   PROCEDURE:  1. Hysteroscopy, resection of endometrial polyp.  2. Posterior and enterocele repairs with cardinal uterosacral     colposuspension.  3. Perineal body repair.   SURGEON:  Gretta Cool, M.D.   ASSISTANT:  Raynald Kemp, M.D.   ANESTHESIA:  General orotracheal.   DESCRIPTION OF PROCEDURE:  Under excellent general anesthesia as above, with  patient prepped and draped in Allen stirrups, the cervix was grasped with  single-tooth tenaculum and progressively dilated to a 33 Pratt dilator. The  endometrial cavity was then systematically photographed and examined. A  large polyp was noted from the left anterior uterine wall. That polyp was  then progressively resected to the base of the polyp and down into the  myometrium for a distance of 3-4 mm. The entire endometrial cavity was then  carefully examined. There was no other evidence of polyp formation. The  anterior wall of the endometrium was virtually completely resected leaving  only the posterior wall. At this point the procedure was terminated without  complication with minimal bleeding at reduced pressure. At this point,  attention was turned to posterior repair. Because it was grasped with Allis  clamp and the mucosa incised, the incision was then carried to the apex of  the vaginal cuff under the cervix.  The mucosa was then progressively  reflected with blunt and sharp dissection from the perirectal fascia. A huge  enterocele sac was then identified and then resected. The enterocele sac was  then ligated as high as possible. A second closure of the enterocele sac was  also performed. At this point the cardinal uterosacral colposuspension was  undertaken. The cuff was also affixed to the ischiorectalis fascia. The  attached fascia was then secured to the uterosacral cardinal colposuspension  with interrupted sutures of Novofil. The posterior aspect of the cervix was  re-attached to the perirectal fascia that had detached and slipped far down  the vagina. The levator fascia was then plicated at the site of tears from  childbirth injury. The fascia was then plicated posteriorly from the apex of  the vaginal cuff to the introitus. The mucosa was then trimmed and the upper  layers of endopelvic fascia and mucosa closed as a  continuous  subcutaneous layer of closure of 2-0 Vicryl. At this point the  perineal body muscles were rebuilt with interrupted sutures of 2-0 Vicryl.  At this point the procedure was terminated without complication. The patient  returned to the recovery room in excellent condition.                                               Gretta Cool, M.D.    CWL/MEDQ  D:  04/22/2003  T:  04/22/2003  Job:  [id number]

## 2010-07-07 NOTE — Discharge Summary (Signed)
NAME:  Deborah Callahan, BREAU NO.:  [id number]   MEDICAL RECORD NO.:  [id number]          PATIENT TYPE:  OBV   LOCATION:  9319                          FACILITY:  [facility]   PHYSICIAN:  Juluis Mire, M.D.   DATE OF BIRTH:  [birth year]   DATE OF ADMISSION:  11/02/2004  DATE OF DISCHARGE:  11/03/2004                                 DISCHARGE SUMMARY   ADMITTING DIAGNOSES:  Pelvic pain with associated deep dyspareunia secondary  to vaginal scarring from posterior repair as well as possible uterine  adenomyosis.   DISCHARGE DIAGNOSES:  Pelvic pain with associated deep dyspareunia secondary  to vaginal scarring from posterior repair as well as possible uterine  adenomyosis.   OPERATIVE PROCEDURE:  Laparoscopic assisted vaginal hysterectomy with  release of posterior scar.   For complete history and physical please see dictated note.   HOSPITAL COURSE:  Patient underwent above noted surgery.  She did well  without complications.  Postoperative hemoglobin was 11.8.  She was  discharged home on her first postoperative day.  At that time she was  afebrile with stable vital signs.  Abdomen soft and nontender.  Bowel sounds  were active.  All incisions were clear.  She was voiding without difficulty,  had minimal vaginal bleeding.   COMPLICATIONS:  None were encountered during stay in hospital.  Patient  discharged home in stable condition.   DISPOSITION:  Routine postoperative instructions were given.  She is to  avoid heavy lifting, vaginal entrance, or driving a car.  Watch for signs of  infection, nausea, vomiting, increasing abdominal pain, or active vaginal  bleeding.  Discharge home with Demerol as she needs for pain for a limited  time.  She will be followed up in the office in approximately one to two  weeks.      Juluis Mire, M.D.  Electronically Signed     JSM/MEDQ  D:  11/03/2004  T:  11/03/2004  Job:  [id number]

## 2010-07-07 NOTE — Assessment & Plan Note (Signed)
[facility]                           GASTROENTEROLOGY OFFICE NOTE   NAME:Callahan, Deborah Anderson                        MRN:          [id number]  DATE:12/10/2005                            DOB:          1960-09-11    CHIEF COMPLAINT:  Irritable bowel problems, bloating, bowel irregularities.   Deborah Callahan had a colonoscopy that showed external hemorrhoids and IBS response  in the sigmoid (the hemorrhoids were old).  I gave her diltiazem gel for  rectal pain, which helped, and she has stopped that.   Medications she is no now are Pyridium p.r.n., Prevacid, and intermittent  Naprosyn.   She describes constipation problems still, and if she uses bisacodyl she  will have a soft, mushy stool.  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  She gave Levbid a  try, but not for very long it sounds like.  Essentially her problems occur  when she goes to defecate and she develops severe cramps.  It is quite  unbearable.  When she goes to urinate she feels like she has to defecate as  well.  She has not yet started her bladder cocktails per Dr. Logan Bores.  She has  a lot of bloating and gassiness as well.  She is going to see a food  allergist to see if there is any link.   VITAL SIGNS:  Weight 134 pounds, pulse 60, blood pressure 100/50.   ASSESSMENT:  I think this is irritable bowel syndrome, constipation-  predominant.  That goes along with interstitial cystitis.   PLAN:  1. Resume the Levbid.  This was re-prescribed.  She did not really give      that a good try.  2. Trial of milk of magnesia in the evening every other day or so to      promote bowel movements and see if that does not relieve her problems      with bloating and distention.  3. Consider a trial of a probiotic or an antibiotic depending on these      results.  She will try me if      this does not work.  4. Keep follow-up with Dr.  Logan Bores.       Iva Boop, MD,FACG   CEG/MedQ DD:  12/10/2005 DT:  12/11/2005 Job #:  [id number]   cc:   Jamison Neighbor, M.D.  Juluis Mire, M.D.  [facility]

## 2011-05-16 ENCOUNTER — Other Ambulatory Visit: Payer: Self-pay | Admitting: Obstetrics and Gynecology

## 2011-05-16 DIAGNOSIS — R300 Dysuria: Secondary | ICD-10-CM | POA: Insufficient documentation

## 2011-05-16 DIAGNOSIS — R319 Hematuria, unspecified: Secondary | ICD-10-CM | POA: Insufficient documentation

## 2011-05-16 DIAGNOSIS — R3915 Urgency of urination: Secondary | ICD-10-CM | POA: Insufficient documentation

## 2011-05-16 DIAGNOSIS — R350 Frequency of micturition: Secondary | ICD-10-CM | POA: Insufficient documentation

## 2011-05-16 DIAGNOSIS — N3010 Interstitial cystitis (chronic) without hematuria: Secondary | ICD-10-CM | POA: Insufficient documentation

## 2011-05-16 DIAGNOSIS — R928 Other abnormal and inconclusive findings on diagnostic imaging of breast: Secondary | ICD-10-CM

## 2011-05-21 ENCOUNTER — Ambulatory Visit
Admission: RE | Admit: 2011-05-21 | Discharge: 2011-05-21 | Disposition: A | Discharge status: Home / Self Care | Payer: BC Managed Care – PPO | Source: Ambulatory Visit | Attending: Obstetrics and Gynecology | Admitting: Obstetrics and Gynecology

## 2011-05-21 DIAGNOSIS — R928 Other abnormal and inconclusive findings on diagnostic imaging of breast: Secondary | ICD-10-CM

## 2011-08-13 ENCOUNTER — Ambulatory Visit: Payer: BC Managed Care – PPO | Admitting: Family Medicine

## 2011-08-13 VITALS — BP 119/78 | HR 57 | Temp 97.6°F | Resp 16 | Ht 66.0 in | Wt 133.8 lb

## 2011-08-13 DIAGNOSIS — R109 Unspecified abdominal pain: Secondary | ICD-10-CM

## 2011-08-13 LAB — POCT CBC
Granulocyte percent: 62.8 %G (ref 37–80)
HCT, POC: 45.7 % (ref 37.7–47.9)
Hemoglobin: 14.7 g/dL (ref 12.2–16.2)
Lymph, poc: 2.4 (ref 0.6–3.4)
MCH, POC: 30.8 pg (ref 27–31.2)
MCHC: 32.2 g/dL (ref 31.8–35.4)
MCV: 95.6 fL (ref 80–97)
MID (cbc): 0.4 (ref 0–0.9)
MPV: 9.8 fL (ref 0–99.8)
POC Granulocyte: 4.6 (ref 2–6.9)
POC LYMPH PERCENT: 32 %L (ref 10–50)
POC MID %: 5.2 %M (ref 0–12)
Platelet Count, POC: 212 10*3/uL (ref 142–424)
RBC: 4.78 M/uL (ref 4.04–5.48)
RDW, POC: 13.4 %
WBC: 7.4 10*3/uL (ref 4.6–10.2)

## 2011-08-13 LAB — POCT URINALYSIS DIPSTICK
Bilirubin, UA: NEGATIVE
Glucose, UA: NEGATIVE
Leukocytes, UA: NEGATIVE
Nitrite, UA: NEGATIVE
Protein, UA: NEGATIVE
Spec Grav, UA: 1.02
Urobilinogen, UA: 0.2
pH, UA: 5.5

## 2011-08-13 LAB — POCT UA - MICROSCOPIC ONLY
Bacteria, U Microscopic: NEGATIVE
Casts, Ur, LPF, POC: NEGATIVE
Crystals, Ur, HPF, POC: NEGATIVE
Mucus, UA: NEGATIVE
RBC, urine, microscopic: NEGATIVE
WBC, Ur, HPF, POC: NEGATIVE
Yeast, UA: NEGATIVE

## 2011-08-13 LAB — POCT SEDIMENTATION RATE: POCT SED RATE: 9 mm/hr (ref 0–22)

## 2011-08-13 LAB — IFOBT (OCCULT BLOOD): IFOBT: NEGATIVE

## 2011-08-13 MED ORDER — CIPROFLOXACIN HCL 250 MG PO TABS: 250.0000 mg | ORAL_TABLET | Freq: Two times a day (BID) | ORAL | Status: AC

## 2011-08-13 MED ORDER — CIPROFLOXACIN HCL 250 MG PO TABS: 250.0000 mg | ORAL_TABLET | Freq: Two times a day (BID) | ORAL | Status: DC

## 2011-08-13 MED ORDER — METRONIDAZOLE 250 MG PO TABS: 250.0000 mg | ORAL_TABLET | Freq: Three times a day (TID) | ORAL | Status: AC

## 2011-08-13 MED ORDER — METRONIDAZOLE 250 MG PO TABS: 250.0000 mg | ORAL_TABLET | Freq: Three times a day (TID) | ORAL | Status: DC

## 2011-08-13 NOTE — Addendum Note (Signed)
Addended by: Jacqualyn Posey on: 08/13/2011 02:17 PM   Modules accepted: Orders

## 2011-08-13 NOTE — Progress Notes (Signed)
51 yo woman with melena and nausea.  Symptoms began about 4 days ago.  This morning at 9:30 am she had loose stool.  She's been having sharp crampy pain in epigastrium.  She had an episode of vomiting in May just before going to [place].  She has lost her appetite.  She had sushi in [place].  Just before coming home at the end of May, patient had severe cramping and diarrhea.  She had fevers June 2 and 3rd as well.  PMHx:  Hiatal hernia confirmed by Dr. Loreta Ave and Dr. Matthias Hughs more recently.  She has an appointment to be seen August 8th. Also has gluten and lactose problems as well as soy problems  Objective:  Cheerful, NAD Skin:  Clear Heart: reg, no murmur Chest:  Clear Abdomen: multiple areas of tenderness(epigastrium, RLQ, LUQ) with no HSM or masses.  No guarding or rebound Rectal:  Minimal stool, no masses, nontender, no hemorrhoids  Results for orders placed in visit on 08/13/11  IFOBT (OCCULT BLOOD)      Component Value Range   IFOBT Negative    POCT CBC      Component Value Range   WBC 7.4  4.6 - 10.2 K/uL   Lymph, poc 2.4  0.6 - 3.4   POC LYMPH PERCENT 32.0  10 - 50 %L   MID (cbc) 0.4  0 - 0.9   POC MID % 5.2  0 - 12 %M   POC Granulocyte 4.6  2 - 6.9   Granulocyte percent 62.8  37 - 80 %G   RBC 4.78  4.04 - 5.48 M/uL   Hemoglobin 14.7  12.2 - 16.2 g/dL   HCT, POC 27.2  53.6 - 47.9 %   MCV 95.6  80 - 97 fL   MCH, POC 30.8  27 - 31.2 pg   MCHC 32.2  31.8 - 35.4 g/dL   RDW, POC 64.4     Platelet Count, POC 212  142 - 424 K/uL   MPV 9.8  0 - 99.8 fL  POCT URINALYSIS DIPSTICK      Component Value Range   Color, UA yellow     Clarity, UA clear     Glucose, UA neg     Bilirubin, UA neg     Ketones, UA 15mg      Spec Grav, UA 1.020     Blood, UA trace-lysed     pH, UA 5.5     Protein, UA neg     Urobilinogen, UA 0.2     Nitrite, UA neg     Leukocytes, UA Negative    POCT UA - MICROSCOPIC ONLY      Component Value Range   WBC, Ur, HPF, POC neg     RBC, urine, microscopic  neg     Bacteria, U Microscopic neg     Mucus, UA neg     Epithelial cells, urine per micros 0-5     Crystals, Ur, HPF, POC neg     Casts, Ur, LPF, POC neg     Yeast, UA neg     Assessment:  Acute gastroenteritis without evidence of significant bleed.  Plan:  1. Abdominal pain  IFOBT POC (occult bld, rslt in office), POCT SEDIMENTATION RATE, POCT CBC, POCT urinalysis dipstick, POCT UA - Microscopic Only, Comprehensive metabolic panel, HELICOBACTER PYLORI  ANTIBODY, IGM, metroNIDAZOLE (FLAGYL) 250 MG tablet, ciprofloxacin (CIPRO) 250 MG tablet   Follow up 48 hours.

## 2011-08-14 LAB — COMPREHENSIVE METABOLIC PANEL
ALT: 20 U/L (ref 0–35)
AST: 22 U/L (ref 0–37)
Albumin: 4.8 g/dL (ref 3.5–5.2)
Alkaline Phosphatase: 68 U/L (ref 39–117)
BUN: 13 mg/dL (ref 6–23)
CO2: 30 mEq/L (ref 19–32)
Calcium: 9.7 mg/dL (ref 8.4–10.5)
Chloride: 103 mEq/L (ref 96–112)
Creat: 0.71 mg/dL (ref 0.50–1.10)
Glucose, Bld: 98 mg/dL (ref 70–99)
Potassium: 4.1 mEq/L (ref 3.5–5.3)
Sodium: 140 mEq/L (ref 135–145)
Total Bilirubin: 0.7 mg/dL (ref 0.3–1.2)
Total Protein: 7.4 g/dL (ref 6.0–8.3)

## 2011-08-15 LAB — HELICOBACTER PYLORI  ANTIBODY, IGM: Helicobacter pylori, IgM: 1.9 U/mL (ref <9.0)

## 2011-08-29 ENCOUNTER — Other Ambulatory Visit: Payer: Self-pay | Admitting: Gastroenterology

## 2011-08-29 DIAGNOSIS — R1013 Epigastric pain: Secondary | ICD-10-CM

## 2011-08-31 ENCOUNTER — Other Ambulatory Visit: Payer: BC Managed Care – PPO

## 2011-09-13 ENCOUNTER — Other Ambulatory Visit: Payer: Self-pay | Admitting: Gastroenterology

## 2011-09-26 ENCOUNTER — Other Ambulatory Visit: Payer: Self-pay | Admitting: Physician Assistant

## 2011-09-26 DIAGNOSIS — R519 Headache, unspecified: Secondary | ICD-10-CM

## 2011-09-27 ENCOUNTER — Ambulatory Visit
Admission: RE | Admit: 2011-09-27 | Discharge: 2011-09-27 | Disposition: A | Discharge status: Home / Self Care | Payer: BC Managed Care – PPO | Source: Ambulatory Visit | Attending: Physician Assistant | Admitting: Physician Assistant

## 2011-09-27 DIAGNOSIS — R519 Headache, unspecified: Secondary | ICD-10-CM

## 2011-11-07 ENCOUNTER — Other Ambulatory Visit: Payer: Self-pay | Admitting: Chiropractic Medicine

## 2011-11-07 ENCOUNTER — Ambulatory Visit
Admission: RE | Admit: 2011-11-07 | Discharge: 2011-11-07 | Disposition: A | Discharge status: Home / Self Care | Payer: BC Managed Care – PPO | Source: Ambulatory Visit | Attending: Chiropractic Medicine | Admitting: Chiropractic Medicine

## 2011-11-07 DIAGNOSIS — M542 Cervicalgia: Secondary | ICD-10-CM

## 2011-11-07 DIAGNOSIS — R51 Headache: Secondary | ICD-10-CM

## 2012-04-29 ENCOUNTER — Encounter (HOSPITAL_COMMUNITY): Payer: Self-pay

## 2012-04-29 DIAGNOSIS — M5010 Cervical disc disorder with radiculopathy, unspecified cervical region: Secondary | ICD-10-CM | POA: Diagnosis present
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
## 2012-05-02 ENCOUNTER — Encounter (HOSPITAL_COMMUNITY): Payer: Self-pay | Admitting: Pharmacy Technician

## 2012-05-03 NOTE — Pre-Procedure Instructions (Signed)
Deborah Callahan Deborah Callahan  05/03/2012   Your procedure is scheduled on:  05/08/2012  Report to [facility] at 11:15 AM.  Call this number if you have problems the morning of surgery: [phone]   Remember:   Do not eat food or drink liquids after midnight. WEDNESDAY   Take these medicines the morning of surgery with A SIP OF WATER: THYROID medicine, use inhaler as needed   Do not wear jewelry, make-up or nail polish.  Do not wear lotions, powders, or perfumes. You may wear deodorant.  Do not shave 48 hours prior to surgery.  Do not bring valuables to the hospital.  Contacts, dentures or bridgework may not be worn into surgery.  Leave suitcase in the car. After surgery it may be brought to your room.  For patients admitted to the hospital, checkout time is 11:00 AM the day of  discharge.   Patients discharged the day of surgery will not be allowed to drive  home.  Name and phone number of your driver: with spouse  Special Instructions: Shower using CHG 2 nights before surgery and the night before surgery.  If you shower the day of surgery use CHG.  Use special wash - you have one bottle of CHG for all showers.  You should use approximately 1/3 of the bottle for each shower.   Please read over the following fact sheets that you were given: Pain Booklet, Coughing and Deep Breathing, MRSA Information and Surgical Site Infection Prevention

## 2012-05-05 ENCOUNTER — Ambulatory Visit (HOSPITAL_COMMUNITY)
Admission: RE | Admit: 2012-05-05 | Discharge: 2012-05-05 | Disposition: A | Discharge status: Home / Self Care | Payer: BC Managed Care – PPO | Source: Ambulatory Visit | Attending: Orthopedic Surgery | Admitting: Orthopedic Surgery

## 2012-05-05 ENCOUNTER — Encounter (HOSPITAL_COMMUNITY): Payer: Self-pay

## 2012-05-05 ENCOUNTER — Encounter (HOSPITAL_COMMUNITY)
Admission: RE | Admit: 2012-05-05 | Discharge: 2012-05-05 | Disposition: A | Discharge status: Home / Self Care | Payer: BC Managed Care – PPO | Source: Ambulatory Visit | Attending: Orthopedic Surgery | Admitting: Orthopedic Surgery

## 2012-05-05 DIAGNOSIS — Z01812 Encounter for preprocedural laboratory examination: Secondary | ICD-10-CM | POA: Insufficient documentation

## 2012-05-05 DIAGNOSIS — Z01811 Encounter for preprocedural respiratory examination: Secondary | ICD-10-CM | POA: Insufficient documentation

## 2012-05-05 DIAGNOSIS — Z01818 Encounter for other preprocedural examination: Secondary | ICD-10-CM | POA: Insufficient documentation

## 2012-05-05 HISTORY — DX: Hypothyroidism, unspecified: E03.9

## 2012-05-05 HISTORY — DX: Other specified postprocedural states: Z98.890

## 2012-05-05 HISTORY — DX: Other complications of anesthesia, initial encounter: T88.59XA

## 2012-05-05 HISTORY — DX: Unspecified osteoarthritis, unspecified site: M19.90

## 2012-05-05 HISTORY — DX: Family history of other specified conditions: Z84.89

## 2012-05-05 HISTORY — DX: Headache: R51

## 2012-05-05 HISTORY — DX: Nausea with vomiting, unspecified: R11.2

## 2012-05-05 HISTORY — DX: Adverse effect of unspecified anesthetic, initial encounter: T41.45XA

## 2012-05-05 LAB — CBC
HCT: 40.5 % (ref 36.0–46.0)
Hemoglobin: 14 g/dL (ref 12.0–15.0)
MCH: 31.6 pg (ref 26.0–34.0)
MCHC: 34.6 g/dL (ref 30.0–36.0)
MCV: 91.4 fL (ref 78.0–100.0)
Platelets: 185 10*3/uL (ref 150–400)
RBC: 4.43 MIL/uL (ref 3.87–5.11)
RDW: 12 % (ref 11.5–15.5)
WBC: 4.8 10*3/uL (ref 4.0–10.5)

## 2012-05-05 LAB — SURGICAL PCR SCREEN
MRSA, PCR: NEGATIVE
Staphylococcus aureus: POSITIVE — AB

## 2012-05-05 LAB — BASIC METABOLIC PANEL
BUN: 11 mg/dL (ref 6–23)
CO2: 27 mEq/L (ref 19–32)
Calcium: 9.4 mg/dL (ref 8.4–10.5)
Chloride: 105 mEq/L (ref 96–112)
Creatinine, Ser: 0.58 mg/dL (ref 0.50–1.10)
GFR calc Af Amer: >90 mL/min (ref >90)
GFR calc non Af Amer: >90 mL/min (ref >90)
Glucose, Bld: 99 mg/dL (ref 70–99)
Potassium: 3.6 mEq/L (ref 3.5–5.1)
Sodium: 140 mEq/L (ref 135–145)

## 2012-05-05 NOTE — Progress Notes (Signed)
Requesting notes , ekg from Dr. Arlyce Dice at [facility] practice

## 2012-05-05 NOTE — Progress Notes (Signed)
Call to Dr. Noreene Larsson, (upon pt. Request), he ok's pt. To have 2 ozs. Of (clear ) apple juice the a.m. Of her surgery with her meds.

## 2012-05-07 MED ORDER — CEFAZOLIN SODIUM-DEXTROSE 2-3 GM-% IV SOLR
2.0000 g | INTRAVENOUS | Status: AC
  Administered 2012-05-08: 2 g via INTRAVENOUS
  Filled 2012-05-07: qty 50

## 2012-05-07 NOTE — Progress Notes (Signed)
Call to [facility], spoke with Eunice Blase, she agrees to fax documents now, ie: ekg & last OV note.

## 2012-05-08 ENCOUNTER — Encounter (HOSPITAL_COMMUNITY)
Admission: RE | Disposition: A | Discharge status: Home / Self Care | Payer: Self-pay | Source: Ambulatory Visit | Attending: Orthopedic Surgery

## 2012-05-08 ENCOUNTER — Encounter (HOSPITAL_COMMUNITY): Payer: Self-pay | Admitting: *Deleted

## 2012-05-08 ENCOUNTER — Ambulatory Visit (HOSPITAL_COMMUNITY): Payer: BC Managed Care – PPO

## 2012-05-08 ENCOUNTER — Observation Stay (HOSPITAL_COMMUNITY)
Admission: RE | Admit: 2012-05-08 | Discharge: 2012-05-09 | Disposition: A | Discharge status: Home / Self Care | Payer: BC Managed Care – PPO | Source: Ambulatory Visit | Attending: Orthopedic Surgery | Admitting: Orthopedic Surgery

## 2012-05-08 ENCOUNTER — Ambulatory Visit (HOSPITAL_COMMUNITY): Payer: BC Managed Care – PPO | Admitting: Anesthesiology

## 2012-05-08 ENCOUNTER — Encounter (HOSPITAL_COMMUNITY): Payer: Self-pay | Admitting: Anesthesiology

## 2012-05-08 ENCOUNTER — Observation Stay (HOSPITAL_COMMUNITY): Payer: BC Managed Care – PPO

## 2012-05-08 DIAGNOSIS — M47812 Spondylosis without myelopathy or radiculopathy, cervical region: Principal | ICD-10-CM | POA: Insufficient documentation

## 2012-05-08 DIAGNOSIS — M5010 Cervical disc disorder with radiculopathy, unspecified cervical region: Secondary | ICD-10-CM | POA: Diagnosis present

## 2012-05-08 HISTORY — PX: ANTERIOR CERVICAL DECOMP/DISCECTOMY FUSION: SHX1161

## 2012-05-08 SURGERY — ANTERIOR CERVICAL DECOMPRESSION/DISCECTOMY FUSION 2 LEVELS
Anesthesia: General | Site: Spine Cervical | Wound class: Clean

## 2012-05-08 MED ORDER — DOCUSATE SODIUM 100 MG PO CAPS: 100.0000 mg | ORAL_CAPSULE | Freq: Three times a day (TID) | ORAL | Status: DC | PRN

## 2012-05-08 MED ORDER — METHOCARBAMOL 500 MG PO TABS: 500.0000 mg | ORAL_TABLET | Freq: Three times a day (TID) | ORAL | Status: DC | PRN

## 2012-05-08 MED ORDER — MIDAZOLAM HCL 5 MG/5ML IJ SOLN
INTRAMUSCULAR | Status: DC | PRN
  Administered 2012-05-08: 1 mg via INTRAVENOUS

## 2012-05-08 MED ORDER — GLYCOPYRROLATE 0.2 MG/ML IJ SOLN
INTRAMUSCULAR | Status: DC | PRN
  Administered 2012-05-08: 0.6 mg via INTRAVENOUS

## 2012-05-08 MED ORDER — METHOCARBAMOL 500 MG PO TABS
ORAL_TABLET | ORAL | Status: AC
  Filled 2012-05-08: qty 1

## 2012-05-08 MED ORDER — BUPIVACAINE-EPINEPHRINE 0.25% -1:200000 IJ SOLN
INTRAMUSCULAR | Status: DC | PRN
  Administered 2012-05-08: 3 mL

## 2012-05-08 MED ORDER — ALBUTEROL SULFATE HFA 108 (90 BASE) MCG/ACT IN AERS: 2.0000 | INHALATION_SPRAY | Freq: Four times a day (QID) | RESPIRATORY_TRACT | Status: DC | PRN

## 2012-05-08 MED ORDER — DEXAMETHASONE SODIUM PHOSPHATE 4 MG/ML IJ SOLN
4.0000 mg | Freq: Once | INTRAMUSCULAR | Status: AC
  Administered 2012-05-08: 4 mg via INTRAVENOUS
  Filled 2012-05-08 (×2): qty 1

## 2012-05-08 MED ORDER — PHENOL 1.4 % MT LIQD: 1.0000 | OROMUCOSAL | Status: DC | PRN

## 2012-05-08 MED ORDER — MIDAZOLAM HCL 5 MG/ML IJ SOLN
1.0000 mg | Freq: Once | INTRAMUSCULAR | Status: AC
  Administered 2012-05-08: 0.5 mg via INTRAVENOUS

## 2012-05-08 MED ORDER — POLYETHYLENE GLYCOL 3350 17 GM/SCOOP PO POWD: 17.0000 g | Freq: Every day | ORAL | Status: DC

## 2012-05-08 MED ORDER — FENTANYL CITRATE 0.05 MG/ML IJ SOLN
INTRAMUSCULAR | Status: DC | PRN
  Administered 2012-05-08: 100 ug via INTRAVENOUS
  Administered 2012-05-08 (×4): 50 ug via INTRAVENOUS

## 2012-05-08 MED ORDER — DOCUSATE SODIUM 100 MG PO CAPS
100.0000 mg | ORAL_CAPSULE | Freq: Two times a day (BID) | ORAL | Status: DC
  Administered 2012-05-08: 100 mg via ORAL
  Filled 2012-05-08: qty 1

## 2012-05-08 MED ORDER — 0.9 % SODIUM CHLORIDE (POUR BTL) OPTIME
TOPICAL | Status: DC | PRN
  Administered 2012-05-08 (×2): 1000 mL

## 2012-05-08 MED ORDER — METHOCARBAMOL 100 MG/ML IJ SOLN
500.0000 mg | Freq: Four times a day (QID) | INTRAVENOUS | Status: DC | PRN
  Filled 2012-05-08: qty 5

## 2012-05-08 MED ORDER — HYDROMORPHONE HCL PF 1 MG/ML IJ SOLN
INTRAMUSCULAR | Status: AC
  Filled 2012-05-08: qty 1

## 2012-05-08 MED ORDER — OXYCODONE HCL 5 MG PO TABS: 5.0000 mg | ORAL_TABLET | Freq: Once | ORAL | Status: DC | PRN

## 2012-05-08 MED ORDER — SODIUM CHLORIDE 0.9 % IJ SOLN: 3.0000 mL | INTRAMUSCULAR | Status: DC | PRN

## 2012-05-08 MED ORDER — OXYCODONE HCL 5 MG/5ML PO SOLN: 5.0000 mg | Freq: Once | ORAL | Status: DC | PRN

## 2012-05-08 MED ORDER — SODIUM CHLORIDE 0.9 % IJ SOLN
3.0000 mL | Freq: Two times a day (BID) | INTRAMUSCULAR | Status: DC
  Administered 2012-05-08: 3 mL via INTRAVENOUS

## 2012-05-08 MED ORDER — LIDOCAINE HCL 4 % MT SOLN
OROMUCOSAL | Status: DC | PRN
  Administered 2012-05-08: 4 mL via TOPICAL

## 2012-05-08 MED ORDER — LACTATED RINGERS IV SOLN: INTRAVENOUS | Status: DC

## 2012-05-08 MED ORDER — SODIUM CHLORIDE 0.9 % IV SOLN: 250.0000 mL | INTRAVENOUS | Status: DC

## 2012-05-08 MED ORDER — ROCURONIUM BROMIDE 100 MG/10ML IV SOLN
INTRAVENOUS | Status: DC | PRN
  Administered 2012-05-08: 10 mg via INTRAVENOUS
  Administered 2012-05-08: 40 mg via INTRAVENOUS

## 2012-05-08 MED ORDER — BUPIVACAINE-EPINEPHRINE 0.25% -1:200000 IJ SOLN
INTRAMUSCULAR | Status: AC
  Filled 2012-05-08: qty 1

## 2012-05-08 MED ORDER — THROMBIN 20000 UNITS EX SOLR
OROMUCOSAL | Status: DC | PRN
  Administered 2012-05-08: 14:00:00 via TOPICAL

## 2012-05-08 MED ORDER — SCOPOLAMINE 1 MG/3DAYS TD PT72
1.0000 | MEDICATED_PATCH | TRANSDERMAL | Status: DC
  Administered 2012-05-08: 1 via TRANSDERMAL

## 2012-05-08 MED ORDER — METHOCARBAMOL 500 MG PO TABS
500.0000 mg | ORAL_TABLET | Freq: Four times a day (QID) | ORAL | Status: DC | PRN
  Administered 2012-05-08 – 2012-05-09 (×2): 500 mg via ORAL
  Filled 2012-05-08: qty 1

## 2012-05-08 MED ORDER — ACETAMINOPHEN 10 MG/ML IV SOLN
1000.0000 mg | Freq: Once | INTRAVENOUS | Status: AC
  Administered 2012-05-08: 1000 mg via INTRAVENOUS
  Filled 2012-05-08: qty 100

## 2012-05-08 MED ORDER — ALPRAZOLAM 0.5 MG PO TABS
1.0000 mg | ORAL_TABLET | Freq: Once | ORAL | Status: AC
  Administered 2012-05-08: 1 mg via ORAL
  Filled 2012-05-08: qty 2

## 2012-05-08 MED ORDER — ONDANSETRON HCL 4 MG/2ML IJ SOLN
INTRAMUSCULAR | Status: AC
  Filled 2012-05-08: qty 2

## 2012-05-08 MED ORDER — CEFAZOLIN SODIUM 1-5 GM-% IV SOLN
1.0000 g | Freq: Three times a day (TID) | INTRAVENOUS | Status: AC
  Administered 2012-05-08 – 2012-05-09 (×2): 1 g via INTRAVENOUS
  Filled 2012-05-08 (×2): qty 50

## 2012-05-08 MED ORDER — DEXAMETHASONE 4 MG PO TABS
4.0000 mg | ORAL_TABLET | Freq: Four times a day (QID) | ORAL | Status: DC
  Administered 2012-05-09: 4 mg via ORAL
  Filled 2012-05-08 (×5): qty 1

## 2012-05-08 MED ORDER — FENTANYL CITRATE 0.05 MG/ML IJ SOLN
100.0000 ug | Freq: Once | INTRAMUSCULAR | Status: AC
  Administered 2012-05-08: 25 ug via INTRAVENOUS

## 2012-05-08 MED ORDER — ONDANSETRON HCL 4 MG/2ML IJ SOLN
INTRAMUSCULAR | Status: DC | PRN
  Administered 2012-05-08: 4 mg via INTRAVENOUS

## 2012-05-08 MED ORDER — LACTATED RINGERS IV SOLN
INTRAVENOUS | Status: DC | PRN
  Administered 2012-05-08 (×2): via INTRAVENOUS

## 2012-05-08 MED ORDER — ACETAMINOPHEN 10 MG/ML IV SOLN
INTRAVENOUS | Status: AC
  Administered 2012-05-08: 1000 mg
  Filled 2012-05-08: qty 100

## 2012-05-08 MED ORDER — ONDANSETRON HCL 4 MG/2ML IJ SOLN
4.0000 mg | Freq: Once | INTRAMUSCULAR | Status: AC
  Administered 2012-05-08: 4 mg via INTRAVENOUS

## 2012-05-08 MED ORDER — PHENYLEPHRINE HCL 10 MG/ML IJ SOLN
INTRAMUSCULAR | Status: DC | PRN
  Administered 2012-05-08 (×4): 40 ug via INTRAVENOUS

## 2012-05-08 MED ORDER — SCOPOLAMINE 1 MG/3DAYS TD PT72
MEDICATED_PATCH | TRANSDERMAL | Status: AC
  Administered 2012-05-08: 1.5 mg via TRANSDERMAL
  Filled 2012-05-08: qty 1

## 2012-05-08 MED ORDER — THROMBIN 20000 UNITS EX SOLR
CUTANEOUS | Status: AC
  Filled 2012-05-08: qty 20000

## 2012-05-08 MED ORDER — MENTHOL 3 MG MT LOZG: 1.0000 | LOZENGE | OROMUCOSAL | Status: DC | PRN

## 2012-05-08 MED ORDER — ONDANSETRON HCL 4 MG/2ML IJ SOLN: 4.0000 mg | INTRAMUSCULAR | Status: DC | PRN

## 2012-05-08 MED ORDER — MIDAZOLAM HCL 2 MG/2ML IJ SOLN
INTRAMUSCULAR | Status: AC
  Administered 2012-05-08: 1 mg
  Filled 2012-05-08: qty 2

## 2012-05-08 MED ORDER — METOCLOPRAMIDE HCL 5 MG/ML IJ SOLN: 10.0000 mg | Freq: Once | INTRAMUSCULAR | Status: DC | PRN

## 2012-05-08 MED ORDER — HEMOSTATIC AGENTS (NO CHARGE) OPTIME
TOPICAL | Status: DC | PRN
  Administered 2012-05-08: 1 via TOPICAL

## 2012-05-08 MED ORDER — THYROID 60 MG PO TABS: 90.0000 mg | ORAL_TABLET | Freq: Every day | ORAL | Status: DC

## 2012-05-08 MED ORDER — DEXAMETHASONE SODIUM PHOSPHATE 4 MG/ML IJ SOLN
4.0000 mg | Freq: Four times a day (QID) | INTRAMUSCULAR | Status: DC
  Administered 2012-05-08 (×2): 4 mg via INTRAVENOUS
  Filled 2012-05-08 (×7): qty 1

## 2012-05-08 MED ORDER — HYDROMORPHONE HCL PF 1 MG/ML IJ SOLN
0.2500 mg | INTRAMUSCULAR | Status: DC | PRN
  Administered 2012-05-08 (×3): 0.5 mg via INTRAVENOUS

## 2012-05-08 MED ORDER — FENTANYL CITRATE 0.05 MG/ML IJ SOLN
INTRAMUSCULAR | Status: AC
  Administered 2012-05-08: 25 ug
  Filled 2012-05-08: qty 2

## 2012-05-08 MED ORDER — LACTATED RINGERS IV SOLN
INTRAVENOUS | Status: DC
  Administered 2012-05-08: 12:00:00 via INTRAVENOUS

## 2012-05-08 MED ORDER — NEOSTIGMINE METHYLSULFATE 1 MG/ML IJ SOLN
INTRAMUSCULAR | Status: DC | PRN
  Administered 2012-05-08: 4 mg via INTRAVENOUS

## 2012-05-08 MED ORDER — THYROID 60 MG PO TABS
90.0000 mg | ORAL_TABLET | Freq: Every day | ORAL | Status: DC
  Administered 2012-05-09: 90 mg via ORAL
  Filled 2012-05-08 (×2): qty 1

## 2012-05-08 MED ORDER — ONDANSETRON HCL 4 MG PO TABS: 4.0000 mg | ORAL_TABLET | Freq: Three times a day (TID) | ORAL | Status: DC | PRN

## 2012-05-08 SURGICAL SUPPLY — 58 items
BLADE SURG ROTATE 9660 (MISCELLANEOUS) IMPLANT
BUR EGG ELITE 4.0 (BURR) IMPLANT
BUR MATCHSTICK NEURO 3.0 LAGG (BURR) IMPLANT
CANISTER SUCTION 2500CC (MISCELLANEOUS) ×2 IMPLANT
CLOTH BEACON ORANGE TIMEOUT ST (SAFETY) ×2 IMPLANT
CLSR STERI-STRIP ANTIMIC 1/2X4 (GAUZE/BANDAGES/DRESSINGS) ×2 IMPLANT
CORDS BIPOLAR (ELECTRODE) ×2 IMPLANT
COVER SURGICAL LIGHT HANDLE (MISCELLANEOUS) ×4 IMPLANT
CRADLE DONUT ADULT HEAD (MISCELLANEOUS) ×2 IMPLANT
DRAPE C-ARM 42X72 X-RAY (DRAPES) ×2 IMPLANT
DRAPE POUCH INSTRU U-SHP 10X18 (DRAPES) ×2 IMPLANT
DRAPE SURG 17X23 STRL (DRAPES) ×2 IMPLANT
DRAPE U-SHAPE 47X51 STRL (DRAPES) ×2 IMPLANT
DRSG MEPILEX BORDER 4X4 (GAUZE/BANDAGES/DRESSINGS) ×2 IMPLANT
DURAPREP 26ML APPLICATOR (WOUND CARE) ×2 IMPLANT
ELECT COATED BLADE 2.86 ST (ELECTRODE) ×2 IMPLANT
ELECT REM PT RETURN 9FT ADLT (ELECTROSURGICAL) ×2
ELECTRODE REM PT RTRN 9FT ADLT (ELECTROSURGICAL) ×1 IMPLANT
GLOVE BIOGEL PI IND STRL 8.5 (GLOVE) ×1 IMPLANT
GLOVE BIOGEL PI INDICATOR 8.5 (GLOVE) ×1
GLOVE ECLIPSE 8.5 STRL (GLOVE) ×1 IMPLANT
GLOVE SURG SS PI 8.5 STRL IVOR (GLOVE) ×1
GLOVE SURG SS PI 8.5 STRL STRW (GLOVE) IMPLANT
GOWN PREVENTION PLUS XXLARGE (GOWN DISPOSABLE) ×2 IMPLANT
GOWN STRL REIN XL XLG (GOWN DISPOSABLE) ×3 IMPLANT
INTERLOCK LRDTC CRVCL VBR 8MM (Peek) IMPLANT
KIT BASIN OR (CUSTOM PROCEDURE TRAY) ×2 IMPLANT
KIT ROOM TURNOVER OR (KITS) ×2 IMPLANT
LORDOTIC CERVICAL VBR 8MM SM (Peek) ×4 IMPLANT
NDL SPNL 18GX3.5 QUINCKE PK (NEEDLE) ×1 IMPLANT
NEEDLE SPNL 18GX3.5 QUINCKE PK (NEEDLE) ×2 IMPLANT
NS IRRIG 1000ML POUR BTL (IV SOLUTION) ×2 IMPLANT
PACK ORTHO CERVICAL (CUSTOM PROCEDURE TRAY) ×2 IMPLANT
PACK UNIVERSAL I (CUSTOM PROCEDURE TRAY) ×2 IMPLANT
PAD ARMBOARD 7.5X6 YLW CONV (MISCELLANEOUS) ×4 IMPLANT
PATTIES SURGICAL .25X.25 (GAUZE/BANDAGES/DRESSINGS) ×2 IMPLANT
PIN DISTRACTION 14 (PIN) ×2 IMPLANT
PLATE TWO LEVEL SKYLINE 30MM (Plate) ×1 IMPLANT
PUTTY BONE DBX 5CC MIX (Putty) ×1 IMPLANT
RESTRAINT LIMB HOLDER UNIV (RESTRAINTS) ×2 IMPLANT
SCREW SKYLINE 14MM SD-VA (Screw) ×6 IMPLANT
SPONGE INTESTINAL PEANUT (DISPOSABLE) ×2 IMPLANT
SPONGE LAP 4X18 X RAY DECT (DISPOSABLE) ×4 IMPLANT
SPONGE SURGIFOAM ABS GEL 100 (HEMOSTASIS) ×2 IMPLANT
SURGIFLO TRUKIT (HEMOSTASIS) IMPLANT
SURGIFLO W/THROMBIN 8M KIT (HEMOSTASIS) ×1 IMPLANT
SUT MNCRL AB 3-0 PS2 18 (SUTURE) ×2 IMPLANT
SUT SILK 2 0 (SUTURE) ×2
SUT SILK 2-0 18XBRD TIE 12 (SUTURE) IMPLANT
SUT VIC AB 2-0 CT1 18 (SUTURE) ×2 IMPLANT
SYR BULB IRRIGATION 50ML (SYRINGE) ×2 IMPLANT
SYR CONTROL 10ML LL (SYRINGE) ×2 IMPLANT
TAPE CLOTH 4X10 WHT NS (GAUZE/BANDAGES/DRESSINGS) ×2 IMPLANT
TAPE UMBILICAL COTTON 1/8X30 (MISCELLANEOUS) ×2 IMPLANT
TOWEL OR 17X24 6PK STRL BLUE (TOWEL DISPOSABLE) ×2 IMPLANT
TOWEL OR 17X26 10 PK STRL BLUE (TOWEL DISPOSABLE) ×2 IMPLANT
TRAY FOLEY CATH 14FR (SET/KITS/TRAYS/PACK) IMPLANT
WATER STERILE IRR 1000ML POUR (IV SOLUTION) ×2 IMPLANT

## 2012-05-08 NOTE — Anesthesia Preprocedure Evaluation (Addendum)
Anesthesia Evaluation  Patient identified by MRN, date of birth, ID band Patient awake    Reviewed: Allergy & Precautions, H&P , NPO status , Patient's Chart, lab work & pertinent test results, reviewed documented beta blocker date and time   History of Anesthesia Complications (+) PONV and Family history of anesthesia reaction  Airway Mallampati: II TM Distance: >3 FB Neck ROM: full    Dental  (+) Teeth Intact and Dental Advisory Given   Pulmonary asthma ,  breath sounds clear to auscultation        Cardiovascular negative cardio ROS  Rhythm:regular Rate:Normal     Neuro/Psych  Headaches,  Neuromuscular disease negative psych ROS   GI/Hepatic Neg liver ROS, hiatal hernia,   Endo/Other  negative endocrine ROSHypothyroidism   Renal/GU Renal disease  negative genitourinary   Musculoskeletal   Abdominal   Peds  Hematology negative hematology ROS (+)   Anesthesia Other Findings See surgeon's H&P   Reproductive/Obstetrics negative OB ROS                          Anesthesia Physical Anesthesia Plan  ASA: II  Anesthesia Plan: General   Post-op Pain Management:    Induction: Intravenous  Airway Management Planned: Oral ETT  Additional Equipment:   Intra-op Plan:   Post-operative Plan: Extubation in OR  Informed Consent: I have reviewed the patients History and Physical, chart, labs and discussed the procedure including the risks, benefits and alternatives for the proposed anesthesia with the patient or authorized representative who has indicated his/her understanding and acceptance.   Dental Advisory Given  Plan Discussed with: CRNA and Surgeon  Anesthesia Plan Comments: (Amidate for induction. ?allergy to propofol. CF)        Anesthesia Quick Evaluation

## 2012-05-08 NOTE — Transfer of Care (Signed)
Immediate Anesthesia Transfer of Care Note  Patient: Deborah Callahan  Procedure(s) Performed: Procedure(s): ACDF C5-7  ANTERIOR CERVICAL DECOMPRESSION/DISCECTOMY FUSION 2 LEVELS (N/A)  Patient Location: PACU  Anesthesia Type:General  Level of Consciousness: awake, alert  and oriented  Airway & Oxygen Therapy: Patient connected to face mask  Post-op Assessment: Report given to PACU RN  Post vital signs: stable  Complications: No apparent anesthesia complications

## 2012-05-08 NOTE — Progress Notes (Signed)
Dr. Jean Rosenthal visited patient, at bedside , cervical xray obtained

## 2012-05-08 NOTE — Progress Notes (Signed)
1300  For some reason, I am unable to put in correct dosage of IV medication...Deborah Callahan has received a total of 1 mg of Versed and 75 mcg of Fentanyl for pain and spasm, 4 mg Zofran for nausea (caused by migraine) and scopolamine patch was removed from behind the left ear and placed behind right...Marland KitchenMarland KitchenDA

## 2012-05-08 NOTE — Brief Op Note (Signed)
05/08/2012  4:37 PM  PATIENT:  Wynne Dust  52 y.o. female  PRE-OPERATIVE DIAGNOSIS:  CERVICAL SPONDYLITIC REDICULOPATHY  POST-OPERATIVE DIAGNOSIS:  CERVICAL SPONDYLITIC REDICULOPATHY  PROCEDURE:  Procedure(s): ACDF C5-7  ANTERIOR CERVICAL DECOMPRESSION/DISCECTOMY FUSION 2 LEVELS (N/A)  SURGEON:  Surgeon(s) and Role:    * Venita Lick, MD - Primary  PHYSICIAN ASSISTANT:   ASSISTANTS: none   ANESTHESIA:   general  EBL:  Total I/O In: 1400 [I.V.:1400] Out: -   BLOOD ADMINISTERED:none  DRAINS: none   LOCAL MEDICATIONS USED:  MARCAINE     SPECIMEN:  No Specimen  DISPOSITION OF SPECIMEN:  N/A  COUNTS:  YES  TOURNIQUET:  * No tourniquets in log *  DICTATION: .Other Dictation: Dictation Number [id number]  PLAN OF CARE: Admit for overnight observation  PATIENT DISPOSITION:  PACU - hemodynamically stable.

## 2012-05-08 NOTE — Anesthesia Procedure Notes (Signed)
Procedure Name: Intubation Date/Time: 05/08/2012 1:47 PM Performed by: Lovie Chol Pre-anesthesia Checklist: Patient identified, Emergency Drugs available, Suction available, Patient being monitored and Timeout performed Patient Re-evaluated:Patient Re-evaluated prior to inductionOxygen Delivery Method: Circle system utilized Preoxygenation: Pre-oxygenation with 100% oxygen Intubation Type: IV induction Ventilation: Mask ventilation without difficulty Laryngoscope Size: Miller and 2 Grade View: Grade I Tube type: Oral Tube size: 7.0 mm Number of attempts: 1 Airway Equipment and Method: Stylet and LTA kit utilized Placement Confirmation: ETT inserted through vocal cords under direct vision,  positive ETCO2,  CO2 detector and breath sounds checked- equal and bilateral Secured at: 21 cm Tube secured with: Tape Dental Injury: Teeth and Oropharynx as per pre-operative assessment

## 2012-05-08 NOTE — Progress Notes (Signed)
Dr. Jean Rosenthal called informed of patient's anaphlactic reaction that dr. Shon Baton said patient had no new orders received for pacu pain medication

## 2012-05-08 NOTE — H&P (Signed)
No change in clinical exam H+P reviewed  

## 2012-05-08 NOTE — Progress Notes (Signed)
Called [facility] at [place] requesting stress or echo. They stated none on file and ekg faxed over was done 04/28/12.

## 2012-05-08 NOTE — Progress Notes (Addendum)
Pt having a panic attack. Spouse is with her at this time. Spouse thinks it's an after effect of anesthesia and requested for something to calm her down. MD on call notified. Order received to give pt Xanax 1mg  po x1. Med given. Will monitor pt.  05/09/12 0700 - Pt responded well to the xanax. Panic attack resolved though pt remained slightly anxious for a long time before everything resolves.

## 2012-05-08 NOTE — Preoperative (Signed)
Beta Blockers   Reason not to administer Beta Blockers:Not Applicable 

## 2012-05-09 ENCOUNTER — Encounter (HOSPITAL_COMMUNITY): Payer: Self-pay | Admitting: Orthopedic Surgery

## 2012-05-09 MED ORDER — MUPIROCIN 2 % EX OINT
TOPICAL_OINTMENT | Freq: Two times a day (BID) | CUTANEOUS | Status: DC
Start: 2012-05-09 — End: 2012-05-09

## 2012-05-09 NOTE — Progress Notes (Signed)
Pt given D/C instructions with Rx's, verbal understanding given. Pt D/C'd home via wheelchair @ 1030 per MD order. Rema Fendt, RN

## 2012-05-09 NOTE — Discharge Summary (Signed)
Patient ID: Deborah Callahan MRN: [id number] DOB/AGE: 07-13-1960 52 y.o.  Admit date: 05/08/2012 Discharge date: 05/09/2012  Admission Diagnoses:  Principal Problem:   Cervical disc disorder with radiculopathy of cervical region   Discharge Diagnoses:  Principal Problem:   Cervical disc disorder with radiculopathy of cervical region  status post Procedure(s): ACDF C5-7  ANTERIOR CERVICAL DECOMPRESSION/DISCECTOMY FUSION 2 LEVELS  Past Medical History  Diagnosis Date  . Hiatal hernia   . Complication of anesthesia   . PONV (postoperative nausea and vomiting)   . Hypothyroidism   . Asthma     seasonal allergies - enviromental allergies   . Chronic kidney disease     interstitial cystitis   . Arthritis     degenerative cerv. spine , beginning signs of carpal tunnel syndrome- L hand    . Headache     migraines   . Family history of anesthesia complication     N&V- mother & sister    Surgeries: Procedure(s): ACDF C5-7  ANTERIOR CERVICAL DECOMPRESSION/DISCECTOMY FUSION 2 LEVELS on 05/08/2012   Consultants:  none  Discharged Condition: Improved  Hospital Course: Deborah Callahan is an 52 y.o. female who was admitted 05/08/2012 for operative treatment of Cervical disc disorder with radiculopathy of cervical region. Patient failed conservative treatments (please see the history and physical for the specifics) and had severe unremitting pain that affects sleep, daily activities and work/hobbies. After pre-op clearance, the patient was taken to the operating room on 05/08/2012 and underwent  Procedure(s): ACDF C5-7  ANTERIOR CERVICAL DECOMPRESSION/DISCECTOMY FUSION 2 LEVELS.    Patient was given perioperative antibiotics: Anti-infectives   Start     Dose/Rate Route Frequency Ordered Stop   05/08/12 2100  ceFAZolin (ANCEF) IVPB 1 g/50 mL premix     1 g 100 mL/hr over 30 Minutes Intravenous Every 8 hours 05/08/12 1844 05/09/12 0555   05/07/12 1423  ceFAZolin (ANCEF) IVPB 2 g/50 mL  premix     2 g 100 mL/hr over 30 Minutes Intravenous 30 min pre-op 05/07/12 1423 05/08/12 1337       Patient was given sequential compression devices and early ambulation to prevent DVT.   Patient benefited maximally from hospital stay and there were no complications. At the time of discharge, the patient was urinating/moving their bowels without difficulty, tolerating a regular diet, pain is controlled with oral pain medications and they have been cleared by PT/OT.   Recent vital signs: Patient Vitals for the past 24 hrs:  BP Temp Temp src Pulse Resp SpO2  05/09/12 0754 132/77 mmHg 99 F (37.2 C) - 98 18 96 %  05/09/12 0356 119/85 mmHg 98.6 F (37 C) Oral 84 20 95 %  05/08/12 2300 124/79 mmHg 98.6 F (37 C) Oral 77 20 95 %  05/08/12 2020 126/69 mmHg 97.8 F (36.6 C) Oral 92 21 100 %  05/08/12 1839 121/72 mmHg 98.5 F (36.9 C) - 93 16 97 %  05/08/12 1745 132/66 mmHg - - 68 14 96 %  05/08/12 1730 122/65 mmHg - - 69 11 100 %  05/08/12 1715 124/62 mmHg - - 75 15 100 %  05/08/12 1700 125/62 mmHg - - 72 13 100 %  05/08/12 1645 129/70 mmHg - - 67 12 100 %  05/08/12 1639 139/74 mmHg 98.4 F (36.9 C) - 88 14 100 %  05/08/12 1041 145/80 mmHg 98.5 F (36.9 C) Oral 82 22 98 %     Recent laboratory studies: No results found for  this basename: WBC, HGB, HCT, PLT, NA, K, CL, CO2, BUN, CREATININE, GLUCOSE, PT, INR, CALCIUM, 2,  in the last 72 hours   Discharge Medications:     Medication List    STOP taking these medications       naproxen sodium 220 MG tablet  Commonly known as:  ANAPROX      TAKE these medications       acetaminophen 500 MG tablet  Commonly known as:  TYLENOL  Take 500-1,000 mg by mouth every 6 (six) hours as needed for pain.     albuterol 108 (90 BASE) MCG/ACT inhaler  Commonly known as:  PROVENTIL HFA;VENTOLIN HFA  Inhale 2 puffs into the lungs every 6 (six) hours as needed for wheezing or shortness of breath.     docusate sodium 100 MG capsule  Commonly  known as:  COLACE  Take 1 capsule (100 mg total) by mouth 3 (three) times daily as needed for constipation.     methocarbamol 500 MG tablet  Commonly known as:  ROBAXIN  Take 1 tablet (500 mg total) by mouth 3 (three) times daily as needed.     multivitamin capsule  Take 1 capsule by mouth daily.     ondansetron 4 MG tablet  Commonly known as:  ZOFRAN  Take 1 tablet (4 mg total) by mouth every 8 (eight) hours as needed for nausea.     phenazopyridine 200 MG tablet  Commonly known as:  PYRIDIUM  Take 200 mg by mouth 3 (three) times daily as needed for pain.     polyethylene glycol powder powder  Commonly known as:  GLYCOLAX  Take 17 g by mouth daily.     thyroid 90 MG tablet  Commonly known as:  ARMOUR  Take 90 mg by mouth daily.     triamcinolone cream 0.1 %  Commonly known as:  KENALOG  Apply 1 application topically 3 (three) times daily. Applies to legs        Diagnostic Studies: Dg Cervical Spine 2 Or 3 Views  05/08/2012  *RADIOLOGY REPORT*  Clinical Data: Postop C5-C7 fusion.  PORTABLE CERVICAL SPINE - 2-3 VIEW 05/08/2012 1734 hours:  Comparison: Intraoperative cervical spine x-ray earlier same date 1509 hours.  Cervical spine x-rays 05/05/2012.  Findings: I am able to visualize through the upper C7 level on the lateral image.  Anatomic alignment through this level.  ACDF with hardware C5-C7.  Interbody fusion plugs at C5-6 and C6-7 which are appropriately positioned.  Normal prevertebral soft tissues.  No acute complicating features.  IMPRESSION: ACDF C5-C7 without acute complicating features.   Original Report Authenticated By: Hulan Saas, M.D.    Dg Cervical Spine 2-3 Views  05/08/2012  *RADIOLOGY REPORT*  Clinical Data: Neck pain.  DG C-ARM 1-60 MIN,CERVICAL SPINE - 2-3 VIEW  Comparison: None.  Findings: Intraoperative C-arm film demonstrates C5-C7 ACDF. Satisfactory position and alignment.  IMPRESSION: As above.   Original Report Authenticated By: Davonna Belling, M.D.     Dg Cervical Spine 2 Or 3 Views  05/05/2012  *RADIOLOGY REPORT*  Clinical Data: Preop C5-6 surgery  CERVICAL SPINE - 2-3 VIEW  Comparison: 11/07/2011  Findings: Cervical spine is visualized to C7-T1 on the lateral view.  Reversal of the normal cervical lordosis.  No evidence of fracture or dislocation.  Vertebral body heights are maintained.  No prevertebral soft tissue swelling.  Visualized lung apices are clear.  IMPRESSION: Unremarkable cervical spine radiographs.   Original Report Authenticated By: Charline Bills, M.D.  Dg C-arm 1-60 Min  05/08/2012  *RADIOLOGY REPORT*  Clinical Data: Neck pain.  DG C-ARM 1-60 MIN,CERVICAL SPINE - 2-3 VIEW  Comparison: None.  Findings: Intraoperative C-arm film demonstrates C5-C7 ACDF. Satisfactory position and alignment.  IMPRESSION: As above.   Original Report Authenticated By: Davonna Belling, M.D.           Follow-up Information   Follow up with Alvy Beal, MD. Call in 2 weeks. (As needed if symptoms worsen)    Contact information:   [address], [address] [place] [address] [phone]       Discharge Plan:  discharge to home  Disposition: doing well.  stable    Signed: Caroline Longie D for Dr. Venita Lick [facility] [phone] 05/09/2012, 9:32 AM

## 2012-05-09 NOTE — Evaluation (Addendum)
Physical Therapy Evaluation Patient Details Name: Deborah Callahan MRN: [id number] DOB: 05-08-1960 Today's Date: 05/09/2012 Time: 6578-4696 PT Time Calculation (min): 15 min  PT Assessment / Plan / Recommendation Clinical Impression  Pt s/p cervical fusion.  Pt doing well with mobility.  No further PT needed.  Encouraged pt to amb at home. No need for OT eval.    PT Assessment  Patent does not need any further PT services    Follow Up Recommendations  No PT follow up    Does the patient have the potential to tolerate intense rehabilitation      Barriers to Discharge        Equipment Recommendations  None recommended by PT    Recommendations for Other Services     Frequency      Precautions / Restrictions Precautions Precautions: Cervical;Fall Restrictions Weight Bearing Restrictions: No   Pertinent Vitals/Pain Bil shoulder cramping.      Mobility  Bed Mobility Bed Mobility: Supine to Sit;Sitting - Scoot to Edge of Bed;Sit to Supine Supine to Sit: 6: Modified independent (Device/Increase time);HOB elevated Sitting - Scoot to Edge of Bed: 6: Modified independent (Device/Increase time) Sit to Supine: 6: Modified independent (Device/Increase time);HOB elevated Transfers Transfers: Sit to Stand;Stand to Sit Sit to Stand: 7: Independent;From bed;From toilet Stand to Sit: 7: Independent;With upper extremity assist;To bed;To toilet Ambulation/Gait Ambulation/Gait Assistance: 7: Independent Ambulation Distance (Feet): 400 Feet Assistive device: None Gait Pattern: Within Functional Limits Stairs: Yes Stairs Assistance: 5: Supervision Stair Management Technique: One rail Right Number of Stairs: 12    Exercises     PT Diagnosis:    PT Problem List:   PT Treatment Interventions:     PT Goals    Visit Information  Last PT Received On: 05/09/12 Assistance Needed: +1    Subjective Data  Subjective: Pt states her rt arm is no longer. Patient Stated Goal: Go home    Prior Functioning  Home Living Lives With: Spouse Available Help at Discharge: Family Type of Home: House Home Layout: Two level;Bed/bath upstairs Alternate Level Stairs-Number of Steps: 14 Alternate Level Stairs-Rails: Right Home Adaptive Equipment: None Prior Function Level of Independence: Independent Driving: Yes    Cognition  Cognition Overall Cognitive Status: Appears within functional limits for tasks assessed/performed Arousal/Alertness: Awake/alert Orientation Level: Appears intact for tasks assessed Behavior During Session: [facility] for tasks performed    Extremity/Trunk Assessment Right Upper Extremity Assessment RUE ROM/Strength/Tone: Within functional levels Left Upper Extremity Assessment LUE ROM/Strength/Tone: Within functional levels Right Lower Extremity Assessment RLE ROM/Strength/Tone: Within functional levels Left Lower Extremity Assessment LLE ROM/Strength/Tone: Within functional levels   Balance Balance Balance Assessed: Yes Dynamic Standing Balance Dynamic Standing - Balance Support: No upper extremity supported;During functional activity Dynamic Standing - Level of Assistance: 7: Independent  End of Session PT - End of Session Equipment Utilized During Treatment: Cervical collar Activity Tolerance: Patient tolerated treatment well Patient left: in bed;with call bell/phone within reach Nurse Communication: Mobility status  GP Functional Assessment Tool Used: clinical judgement Functional Limitation: Mobility: Walking and moving around Mobility: Walking and Moving Around Current Status (E9528): 0 percent impaired, limited or restricted Mobility: Walking and Moving Around Goal Status (U1324): 0 percent impaired, limited or restricted Mobility: Walking and Moving Around Discharge Status (M0102): 0 percent impaired, limited or restricted   [facility] 05/09/2012, 9:43 AM  [facility] [phone]

## 2012-05-09 NOTE — Progress Notes (Signed)
    Subjective: Procedure(s) (LRB): ACDF C5-7  ANTERIOR CERVICAL DECOMPRESSION/DISCECTOMY FUSION 2 LEVELS (N/A) 1 Day Post-Op  Patient reports pain as 2 on 0-10 scale.  Reports decreased arm pain denies incisional neck pain   Positive void Negative bowel movement Positive flatus Negative chest pain or shortness of breath  Objective: Vital signs in last 24 hours: Temp:  [97.8 F (36.6 C)-99 F (37.2 C)] 99 F (37.2 C) (03/21 0754) Pulse Rate:  [67-98] 98 (03/21 0754) Resp:  [11-22] 18 (03/21 0754) BP: (119-145)/(62-85) 132/77 mmHg (03/21 0754) SpO2:  [95 %-100 %] 96 % (03/21 0754)  Intake/Output from previous day: 03/20 0701 - 03/21 0700 In: 1400 [I.V.:1400] Out: -   Labs: No results found for this basename: WBC, RBC, HCT, PLT,  in the last 72 hours No results found for this basename: NA, K, CL, CO2, BUN, CREATININE, GLUCOSE, CALCIUM,  in the last 72 hours No results found for this basename: LABPT, INR,  in the last 72 hours  Physical Exam: Neurologically intact ABD soft Neurovascular intact Intact pulses distally Incision: dressing C/D/I and no drainage Compartment soft  Assessment/Plan: Patient stable  xrays satisfactory Mobilization with physical therapy Encourage incentive spirometry Continue care  Advance diet Up with therapy Ok for d/c to home  Doing well   Venita Lick, MD [facility] [phone]

## 2012-05-09 NOTE — Anesthesia Postprocedure Evaluation (Signed)
  Anesthesia Post-op Note  Patient: Deborah Callahan  Procedure(s) Performed: Procedure(s): ACDF C5-7  ANTERIOR CERVICAL DECOMPRESSION/DISCECTOMY FUSION 2 LEVELS (N/A)  Patient Location: PACU  Anesthesia Type:General  Level of Consciousness: awake, alert , oriented and patient cooperative  Airway and Oxygen Therapy: Patient Spontanous Breathing and Patient connected to nasal cannula oxygen  Post-op Pain: mild  Post-op Assessment: Post-op Vital signs reviewed, Patient's Cardiovascular Status Stable, Respiratory Function Stable, Patent Airway, No signs of Nausea or vomiting and Pain level controlled  Post-op Vital Signs: Reviewed and stable  Complications: No apparent anesthesia complications  (note today, patient seen last night)

## 2012-05-09 NOTE — Progress Notes (Signed)
OT Cancellation Note  Patient Details Name: Deborah Callahan MRN: [id number] DOB: Apr 01, 1960   Cancelled Treatment:    Reason Eval/Treat Not Completed: Other (comment) (No OT needs per PT)  [facility] Jarrett Albor, OTR/L  [phone] 05/09/2012 05/09/2012, 9:29 AM

## 2012-05-09 NOTE — Op Note (Signed)
NAME:  Deborah Callahan, Deborah Callahan NO.:  [id number]  MEDICAL RECORD NO.:  [id number]  LOCATION:  3535                         FACILITY:  [facility]  PHYSICIAN:  Alvy Beal, MD    DATE OF BIRTH:  Nov 24, 1960  DATE OF PROCEDURE: DATE OF DISCHARGE:                              OPERATIVE REPORT   PREOPERATIVE DIAGNOSIS:  Two-level cervical spondylotic radiculopathy, C5-6, C6-7.  POSTOPERATIVE DIAGNOSIS:  Two-level cervical spondylotic radiculopathy, C5-6, C6-7.  OPERATIVE PROCEDURE:  Anterior cervical diskectomy and fusion, C5-6, C6- 7.  COMPLICATIONS:  None.  CONDITION:  Stable.  HISTORY:  This is a very pleasant woman who has been having significant neck and radicular right arm pain.  Despite long-term appropriate conservative management, her overall symptoms did not improve.  As a result, we elected to proceed with surgery.  All appropriate risks, benefits, and alternatives were discussed and consent was obtained.  OPERATIVE NOTE:  The patient was brought to the operating room and placed supine on the operating table.  After successful induction of anesthesia and endotracheal intubation, TEDs and SCDs were applied. Towels were placed between the shoulder blades, and the anterior cervical spine was prepped and draped in a standard fashion.  Time-out was then done to confirm the patient, procedure, and all other pertinent important data.  Once this was completed, I then used x-ray to identify the C6 vertebral body and then infiltrated the transverse incision. This was done with 0.25% Marcaine.  I then made an incision starting on the midline and moving to the left.  Sharp dissection was carried out down to and through the platysma.  I then dissected along the medial border of the sternocleidomastoid into the deep cervical fascia.  I then bluntly dissected through the prevertebral fascia and then identified the carotid sheath and protected it with the finger.  I then  swept the esophagus and trachea to the right and protected it with an appendiceal retractor.  I then identified the C5-6 disk space.  I placed a needle into it and took an x-ray and confirmed the level.  Once I had confirmed the level, I mobilized the longus coli muscles bilaterally using bipolar electrocautery from the midbody of C5 to the midbody of C7.  I then placed a self-retaining retractor underneath the longus coli muscle, deflated the endotracheal cuff, expanded the retractor blades and then reinflated the cuff.  I then proceeded with the diskectomy.  An annulotomy was placed with 15-blade scalpel.  I used the combination of pituitary rongeurs, curettes, and Kerrison rongeurs to remove the bulk of the disk material.  I then used a 2-mm Kerrison to remove the overhanging osteophyte from the inferior aspect of the C6 vertebral body.  I then placed distraction pins into the bodies of C6 and C7, distracted the interbody space and then expanded the retractor system. Once this was done, I then continued using curettes and Kerrison rongeurs to remove all of the disk material at C6-7.  I then released the annulus and then used a nerve hook to develop a plane underneath the posterior longitudinal ligament.  Once this was done, I used a 1-mm Kerrison to resect the posterior longitudinal  ligament.  This allowed me to get underneath the uncovertebral joint and resect the osteophyte. Once this was done, I had an adequate decompression.  I then rasped the endplates and then measured with trial devices.  I then placed the Titan titanium size 8 lordotic small cage packed with DBX Mix into the disk space.  This provided me with an excellent fixation and good distraction.  I then removed the distraction pin from the body of C7, placed into the body of C5, repositioned my self-retaining retractors and then performed a similar diskectomy at C5-6.  It should note that at C5-6, I was able to deliver a  soft disk fragment from the right posterolateral corner.  I was also able to resect the osteophytes.  At this point with the diskectomy complete using the same technique, I prepped the endplates and used the same size graft.  I then obtained a 28-mm anterior cervical S[employer] SKYLINE plate and affixed it with 14-mm self-drilling screws into the bodies of C5, C6, and C7.  All screws were properly torqued down and then locked according to manufacturer's standards.  Once this was done, I irrigated the wound copiously with normal saline and made sure I had hemostasis using bipolar electrocautery.  I then returned the trachea and esophagus to midline after removing all of the remaining retractors and then closed the platysma with interrupted 2-0 Vicryl sutures and 3-0 Monocryl for the skin.  Steri-Strips and dry dressing were applied.  The patient was ultimately extubated and transferred to the PACU without incident. At the end of the case, all needle and sponge counts were correct. There was no adverse intraoperative events.     Alvy Beal, MD     DDB/MEDQ  D:  05/08/2012  T:  05/09/2012  Job:  [id number]

## 2012-05-09 NOTE — Progress Notes (Signed)
Utilization review completed. Armistead Sult, RN, BSN. 

## 2012-09-05 ENCOUNTER — Other Ambulatory Visit: Payer: Self-pay | Admitting: Gastroenterology

## 2013-03-26 ENCOUNTER — Other Ambulatory Visit: Payer: Self-pay | Admitting: Internal Medicine

## 2013-03-26 DIAGNOSIS — M545 Low back pain, unspecified: Secondary | ICD-10-CM

## 2013-03-26 DIAGNOSIS — R319 Hematuria, unspecified: Secondary | ICD-10-CM

## 2013-03-27 ENCOUNTER — Ambulatory Visit
Admission: RE | Admit: 2013-03-27 | Discharge: 2013-03-27 | Disposition: A | Discharge status: Home / Self Care | Payer: BC Managed Care – PPO | Source: Ambulatory Visit | Attending: Internal Medicine | Admitting: Internal Medicine

## 2013-03-27 DIAGNOSIS — M545 Low back pain, unspecified: Secondary | ICD-10-CM

## 2013-03-27 DIAGNOSIS — R319 Hematuria, unspecified: Secondary | ICD-10-CM

## 2013-04-08 ENCOUNTER — Other Ambulatory Visit: Payer: Self-pay | Admitting: Orthopedic Surgery

## 2013-04-08 DIAGNOSIS — M533 Sacrococcygeal disorders, not elsewhere classified: Principal | ICD-10-CM

## 2013-04-08 DIAGNOSIS — G8929 Other chronic pain: Secondary | ICD-10-CM

## 2013-04-09 ENCOUNTER — Ambulatory Visit
Admission: RE | Admit: 2013-04-09 | Discharge: 2013-04-09 | Disposition: A | Discharge status: Home / Self Care | Payer: BC Managed Care – PPO | Source: Ambulatory Visit | Attending: Orthopedic Surgery | Admitting: Orthopedic Surgery

## 2013-04-09 DIAGNOSIS — G8929 Other chronic pain: Secondary | ICD-10-CM

## 2013-04-09 DIAGNOSIS — M533 Sacrococcygeal disorders, not elsewhere classified: Principal | ICD-10-CM

## 2013-04-09 MED ORDER — IOHEXOL 180 MG/ML  SOLN
1.0000 mL | Freq: Once | INTRAMUSCULAR | Status: AC | PRN
  Administered 2013-04-09: 1 mL via INTRA_ARTICULAR

## 2013-04-09 MED ORDER — METHYLPREDNISOLONE ACETATE 40 MG/ML INJ SUSP (RADIOLOG
120.0000 mg | Freq: Once | INTRAMUSCULAR | Status: AC
  Administered 2013-04-09: 120 mg via INTRA_ARTICULAR

## 2013-09-15 IMAGING — CR DG CERVICAL SPINE 2 OR 3 VIEWS
2 series · 2 of 2 positions shown · non-contrast
Comparison: Intraoperative cervical spine x-ray earlier same date
3991 hours.  Cervical spine x-rays 05/05/2012.

CLINICAL DATA: Postop C5-C7 fusion.

PORTABLE CERVICAL SPINE - 2-3 VIEW [DATE]/0094 9394 hours:

[AP (1 of 2)]
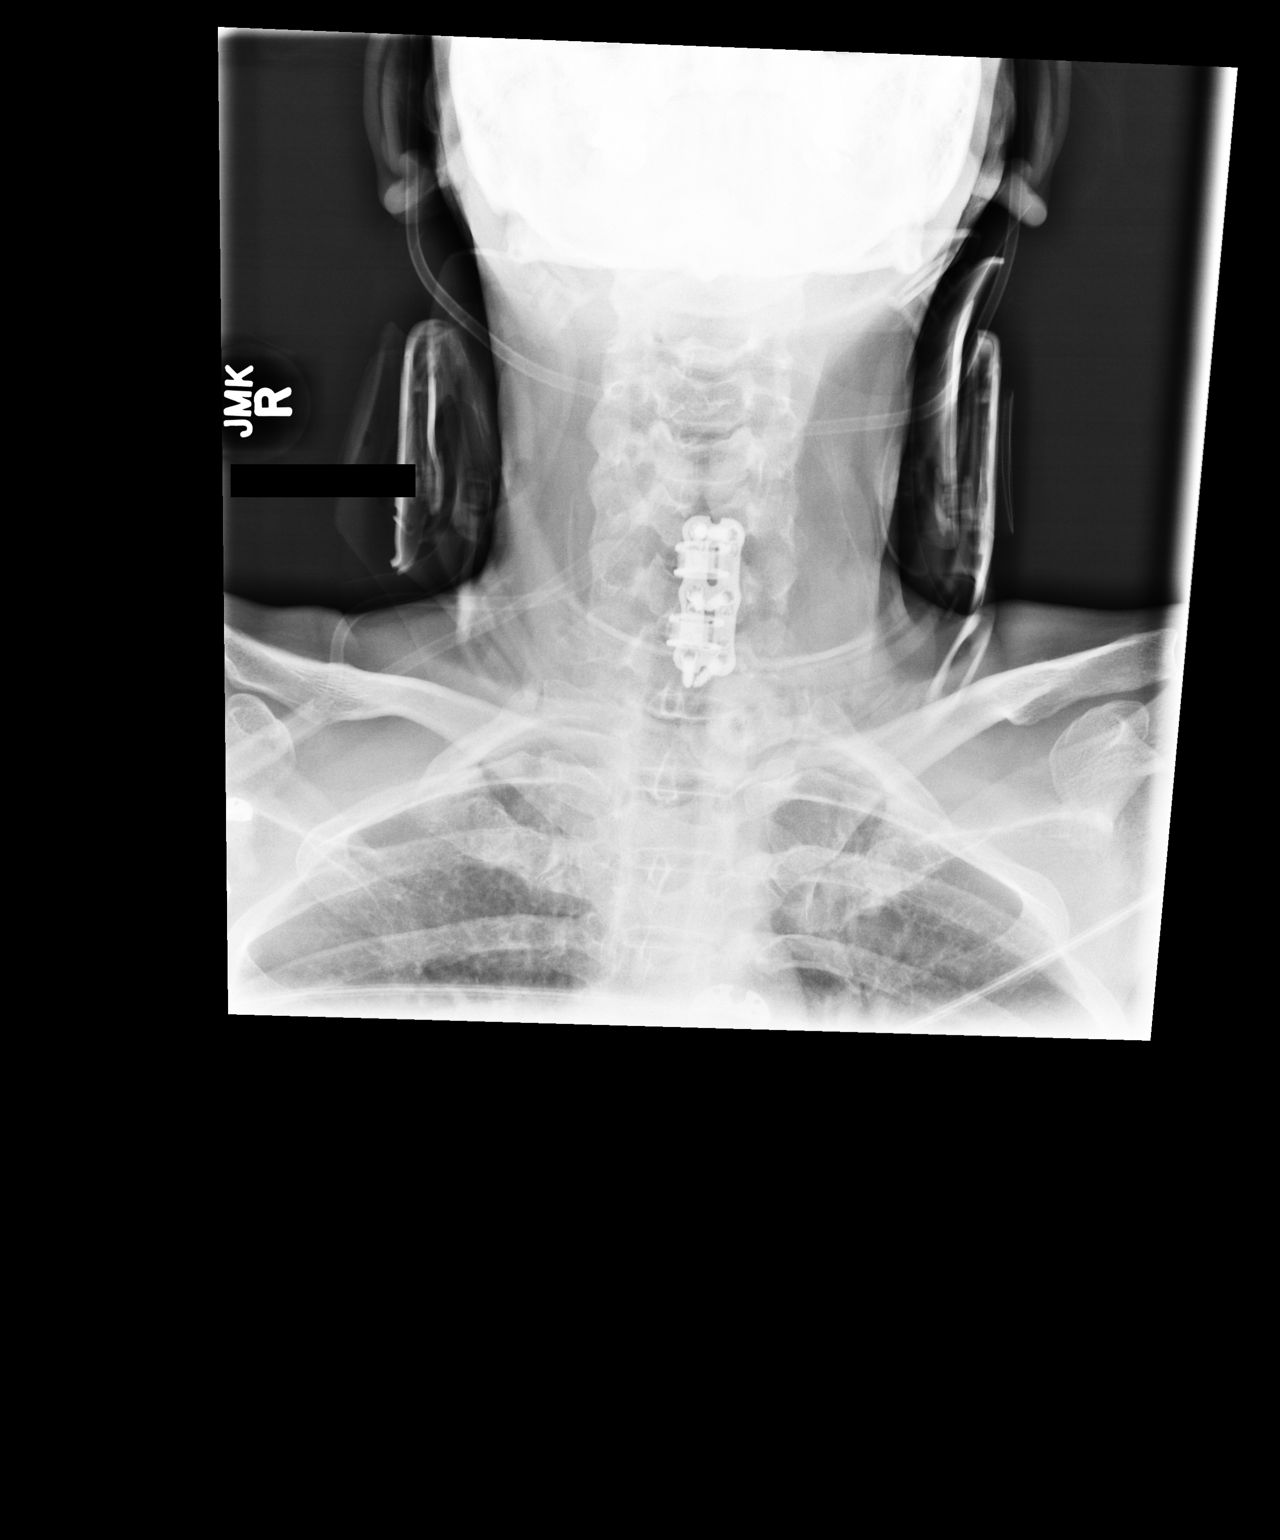

[AP (2 of 2)]
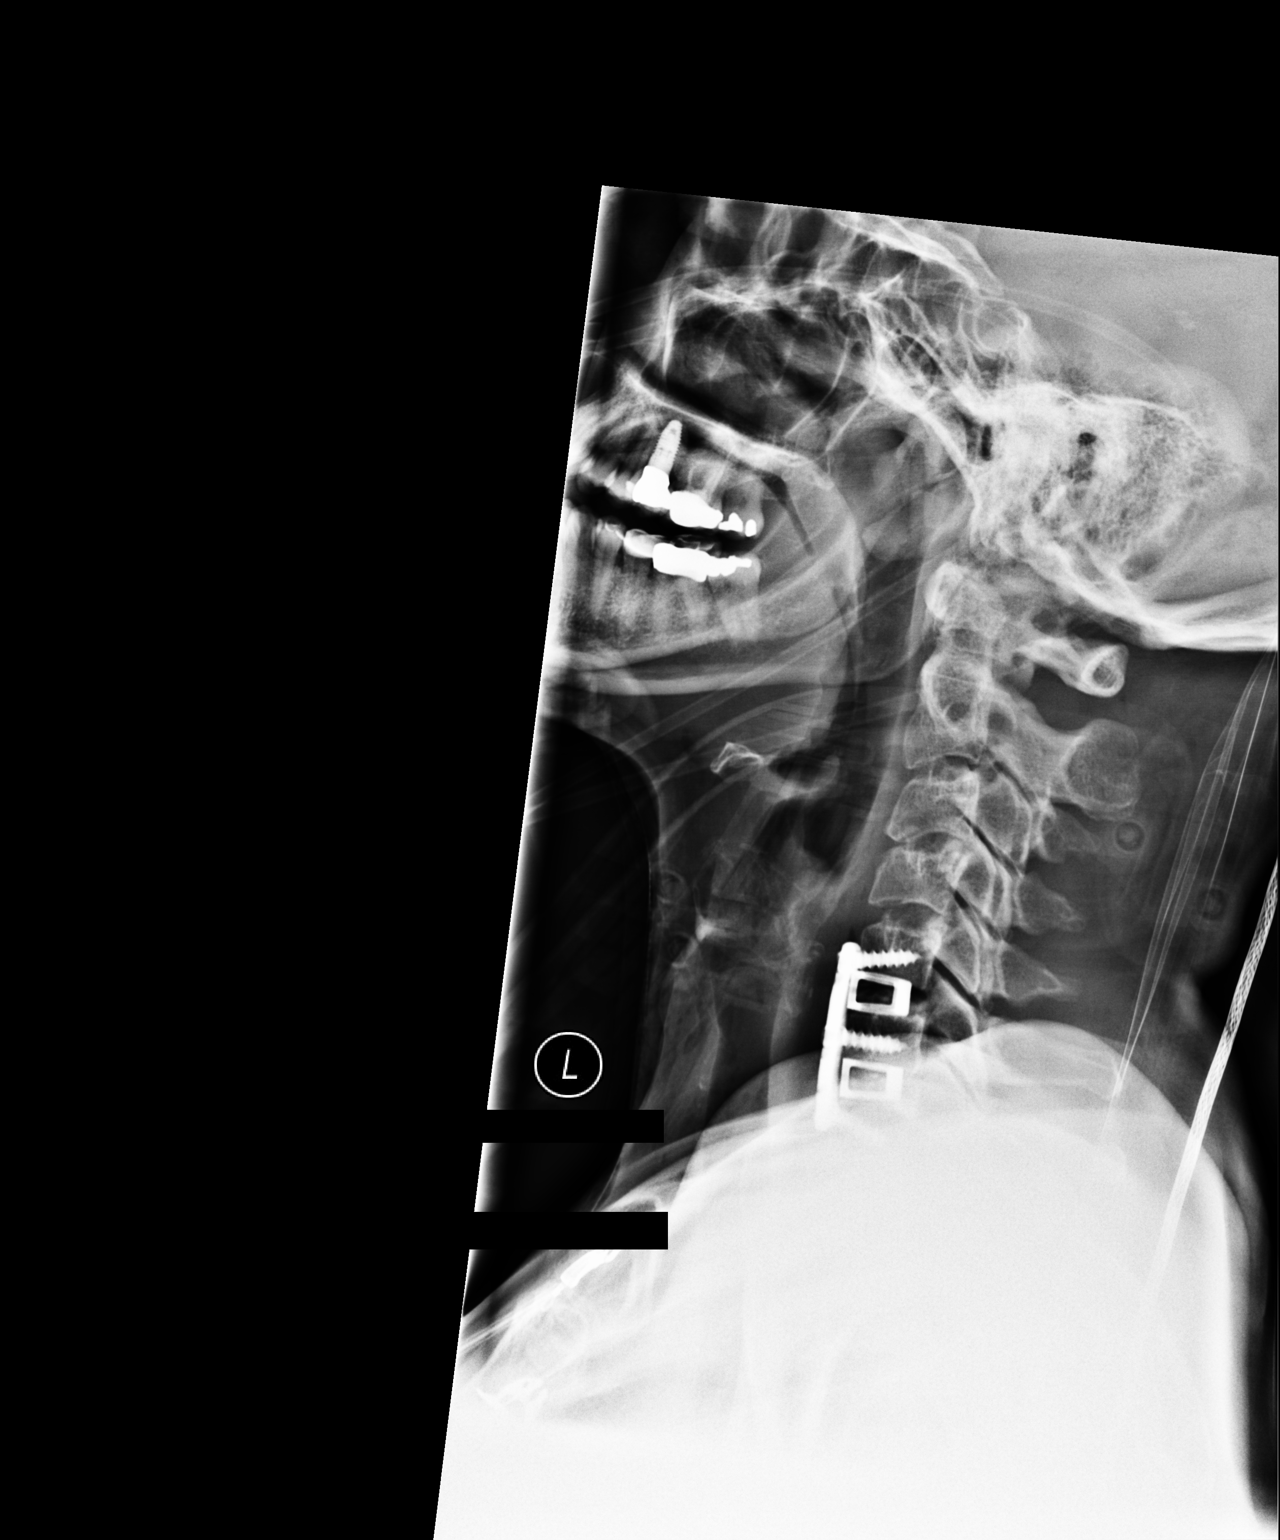

[2 of 2 positions shown; findings below may reference images not displayed]

FINDINGS: I am able to visualize through the upper C7 level on the
lateral image.  Anatomic alignment through this level.  ACDF with
hardware C5-C7.  Interbody fusion plugs at C5-6 and C6-7 which are
appropriately positioned.  Normal prevertebral soft tissues.  No
acute complicating features.
IMPRESSION: ACDF C5-C7 without acute complicating features.

## 2014-03-30 ENCOUNTER — Other Ambulatory Visit: Payer: Self-pay | Admitting: Obstetrics and Gynecology

## 2014-03-30 DIAGNOSIS — R922 Inconclusive mammogram: Secondary | ICD-10-CM

## 2014-04-01 DIAGNOSIS — N3000 Acute cystitis without hematuria: Secondary | ICD-10-CM | POA: Insufficient documentation

## 2014-04-02 ENCOUNTER — Other Ambulatory Visit: Payer: Self-pay

## 2014-04-02 ENCOUNTER — Other Ambulatory Visit: Payer: Self-pay | Admitting: Obstetrics and Gynecology

## 2014-04-02 DIAGNOSIS — R922 Inconclusive mammogram: Secondary | ICD-10-CM

## 2014-04-06 ENCOUNTER — Ambulatory Visit
Admission: RE | Admit: 2014-04-06 | Discharge: 2014-04-06 | Disposition: A | Discharge status: Home / Self Care | Payer: BLUE CROSS/BLUE SHIELD | Source: Ambulatory Visit | Attending: Obstetrics and Gynecology | Admitting: Obstetrics and Gynecology

## 2014-04-06 ENCOUNTER — Other Ambulatory Visit: Payer: Self-pay

## 2014-04-06 DIAGNOSIS — R922 Inconclusive mammogram: Secondary | ICD-10-CM

## 2014-06-24 ENCOUNTER — Encounter: Admission: RE | Payer: Self-pay | Source: Ambulatory Visit

## 2014-06-24 ENCOUNTER — Ambulatory Visit
Admission: RE | Admit: 2014-06-24 | Payer: BLUE CROSS/BLUE SHIELD | Source: Ambulatory Visit | Admitting: Obstetrics and Gynecology

## 2014-06-24 SURGERY — ANTERIOR AND POSTERIOR REPAIR WITH SACROSPINOUS FIXATION
Anesthesia: Choice

## 2014-07-17 ENCOUNTER — Emergency Department (HOSPITAL_COMMUNITY)
Admission: EM | Admit: 2014-07-17 | Discharge: 2014-07-17 | Disposition: A | Discharge status: Home / Self Care | Payer: BLUE CROSS/BLUE SHIELD | Attending: Emergency Medicine | Admitting: Emergency Medicine

## 2014-07-17 ENCOUNTER — Emergency Department (HOSPITAL_COMMUNITY): Payer: BLUE CROSS/BLUE SHIELD

## 2014-07-17 ENCOUNTER — Encounter (HOSPITAL_COMMUNITY): Payer: Self-pay | Admitting: Emergency Medicine

## 2014-07-17 DIAGNOSIS — R109 Unspecified abdominal pain: Secondary | ICD-10-CM

## 2014-07-17 DIAGNOSIS — E039 Hypothyroidism, unspecified: Secondary | ICD-10-CM | POA: Diagnosis not present

## 2014-07-17 DIAGNOSIS — R197 Diarrhea, unspecified: Secondary | ICD-10-CM | POA: Insufficient documentation

## 2014-07-17 DIAGNOSIS — Z791 Long term (current) use of non-steroidal anti-inflammatories (NSAID): Secondary | ICD-10-CM | POA: Diagnosis not present

## 2014-07-17 DIAGNOSIS — Z90710 Acquired absence of both cervix and uterus: Secondary | ICD-10-CM | POA: Insufficient documentation

## 2014-07-17 DIAGNOSIS — Z91040 Latex allergy status: Secondary | ICD-10-CM | POA: Insufficient documentation

## 2014-07-17 DIAGNOSIS — N189 Chronic kidney disease, unspecified: Secondary | ICD-10-CM | POA: Insufficient documentation

## 2014-07-17 DIAGNOSIS — R630 Anorexia: Secondary | ICD-10-CM | POA: Diagnosis not present

## 2014-07-17 DIAGNOSIS — R1031 Right lower quadrant pain: Secondary | ICD-10-CM | POA: Diagnosis not present

## 2014-07-17 DIAGNOSIS — Z9049 Acquired absence of other specified parts of digestive tract: Secondary | ICD-10-CM | POA: Diagnosis not present

## 2014-07-17 DIAGNOSIS — Z8739 Personal history of other diseases of the musculoskeletal system and connective tissue: Secondary | ICD-10-CM | POA: Insufficient documentation

## 2014-07-17 DIAGNOSIS — R112 Nausea with vomiting, unspecified: Secondary | ICD-10-CM | POA: Insufficient documentation

## 2014-07-17 DIAGNOSIS — Z79899 Other long term (current) drug therapy: Secondary | ICD-10-CM | POA: Insufficient documentation

## 2014-07-17 DIAGNOSIS — J45909 Unspecified asthma, uncomplicated: Secondary | ICD-10-CM | POA: Diagnosis not present

## 2014-07-17 LAB — URINALYSIS, ROUTINE W REFLEX MICROSCOPIC
Bilirubin Urine: NEGATIVE
Glucose, UA: NEGATIVE mg/dL
Hgb urine dipstick: NEGATIVE
Ketones, ur: NEGATIVE mg/dL
Leukocytes, UA: NEGATIVE
Nitrite: NEGATIVE
Protein, ur: NEGATIVE mg/dL
Specific Gravity, Urine: 1.023 (ref 1.005–1.030)
Urobilinogen, UA: 0.2 mg/dL (ref 0.0–1.0)
pH: 5.5 (ref 5.0–8.0)

## 2014-07-17 LAB — COMPREHENSIVE METABOLIC PANEL WITH GFR
ALT: 22 U/L (ref 14–54)
AST: 25 U/L (ref 15–41)
Albumin: 4.5 g/dL (ref 3.5–5.0)
Alkaline Phosphatase: 44 U/L (ref 38–126)
Anion gap: 8 (ref 5–15)
BUN: 12 mg/dL (ref 6–20)
CO2: 27 mmol/L (ref 22–32)
Calcium: 9.7 mg/dL (ref 8.9–10.3)
Chloride: 107 mmol/L (ref 101–111)
Creatinine, Ser: 0.7 mg/dL (ref 0.44–1.00)
GFR calc Af Amer: >60 mL/min
GFR calc non Af Amer: >60 mL/min
Glucose, Bld: 100 mg/dL — ABNORMAL HIGH (ref 65–99)
Potassium: 3.7 mmol/L (ref 3.5–5.1)
Sodium: 142 mmol/L (ref 135–145)
Total Bilirubin: 0.9 mg/dL (ref 0.3–1.2)
Total Protein: 7.4 g/dL (ref 6.5–8.1)

## 2014-07-17 LAB — CBC WITH DIFFERENTIAL/PLATELET
Basophils Absolute: 0 K/uL (ref 0.0–0.1)
Basophils Relative: 0 % (ref 0–1)
Eosinophils Absolute: 0 K/uL (ref 0.0–0.7)
Eosinophils Relative: 1 % (ref 0–5)
HCT: 42.3 % (ref 36.0–46.0)
Hemoglobin: 14.2 g/dL (ref 12.0–15.0)
Lymphocytes Relative: 35 % (ref 12–46)
Lymphs Abs: 1.5 K/uL (ref 0.7–4.0)
MCH: 30.7 pg (ref 26.0–34.0)
MCHC: 33.6 g/dL (ref 30.0–36.0)
MCV: 91.4 fL (ref 78.0–100.0)
Monocytes Absolute: 0.3 K/uL (ref 0.1–1.0)
Monocytes Relative: 7 % (ref 3–12)
Neutro Abs: 2.5 K/uL (ref 1.7–7.7)
Neutrophils Relative %: 57 % (ref 43–77)
Platelets: 192 K/uL (ref 150–400)
RBC: 4.63 MIL/uL (ref 3.87–5.11)
RDW: 12.1 % (ref 11.5–15.5)
WBC: 4.4 K/uL (ref 4.0–10.5)

## 2014-07-17 LAB — LIPASE, BLOOD: Lipase: 21 U/L — ABNORMAL LOW (ref 22–51)

## 2014-07-17 MED ORDER — DICYCLOMINE HCL 20 MG PO TABS: 20.0000 mg | ORAL_TABLET | Freq: Two times a day (BID) | ORAL | Status: DC

## 2014-07-17 MED ORDER — DICYCLOMINE HCL 20 MG PO TABS: 20.0000 mg | ORAL_TABLET | Freq: Three times a day (TID) | ORAL | Status: DC | PRN

## 2014-07-17 MED ORDER — ONDANSETRON HCL 4 MG/2ML IJ SOLN
4.0000 mg | Freq: Once | INTRAMUSCULAR | Status: AC
  Administered 2014-07-17: 4 mg via INTRAVENOUS
  Filled 2014-07-17: qty 2

## 2014-07-17 MED ORDER — ONDANSETRON 8 MG PO TBDP: 8.0000 mg | ORAL_TABLET | Freq: Three times a day (TID) | ORAL | Status: DC | PRN

## 2014-07-17 MED ORDER — IOHEXOL 300 MG/ML  SOLN
100.0000 mL | Freq: Once | INTRAMUSCULAR | Status: AC | PRN
  Administered 2014-07-17: 100 mL via INTRAVENOUS

## 2014-07-17 MED ORDER — FENTANYL CITRATE (PF) 100 MCG/2ML IJ SOLN
75.0000 ug | Freq: Once | INTRAMUSCULAR | Status: AC
  Administered 2014-07-17: 75 ug via INTRAVENOUS
  Filled 2014-07-17: qty 2

## 2014-07-17 MED ORDER — IOHEXOL 300 MG/ML  SOLN
50.0000 mL | Freq: Once | INTRAMUSCULAR | Status: AC | PRN
  Administered 2014-07-17: 50 mL via ORAL

## 2014-07-17 NOTE — ED Notes (Signed)
Awake. Verbally responsive. Resp even and unlabored. No audible adventitious breath sounds noted. ABC's intact. Abd soft/nondistended but tender to palpate. BS (+) and active x4 quadrants. No N/V/D reported. IV saline lock patent and intact. Family at bedside.

## 2014-07-17 NOTE — ED Notes (Signed)
Pt c/o Severe RLQ abdominal pain x 3 weeks, becoming much worse last night. Pt also c/o abdominal distention. Pt sts pain was unrelieved by acid and gas reducers. Pt A&Ox4 and ambulatory. Pt c/o pain to palpation. Pt sts pain is burning and stabbing. Pt c/o N/V. Denies diarrhea or fevers.

## 2014-07-17 NOTE — ED Provider Notes (Signed)
CSN: [id number]     Arrival date & time 07/17/14  1602 History   First MD Initiated Contact with Patient 07/17/14 1620     Chief Complaint  Patient presents with  . Abdominal Pain   Patient is a 54 y.o. female presenting with abdominal pain. The history is provided by the patient.  Abdominal Pain Pain location:  RLQ Pain quality: aching and sharp   Pain radiates to:  Does not radiate Pain severity:  Severe Onset quality:  Gradual Duration:  3 weeks Timing:  Constant Progression:  Worsening Chronicity:  New Context: previous surgery   Context: not diet changes and not recent illness   Relieved by:  Nothing Worsened by:  Palpation Ineffective treatments:  Antacids Associated symptoms: anorexia, diarrhea, dysuria, nausea and vomiting   Associated symptoms: no cough, no fever and no shortness of breath    patient has had some prior issues with abdominal pain. In the past she has had some discomfort associated with interstitial cystitis so when the symptoms first started she did not think too much of it. The patient also has had intermittent abdominal cramping that she is associated with her hormonal cycle. Has had a hysterectomy but does have her ovaries so intermittently she's had some discomfort in her abdomen that she has associated with her ovaries.  This episode has been increasing an in the last day or two she developed nausea, vomiting and diarrhea.  Maybe 6 episodes in the last day.  She called her son who is a a doctor and decided to come to the ED.  Past Medical History  Diagnosis Date  . Hiatal hernia   . Complication of anesthesia   . PONV (postoperative nausea and vomiting)   . Hypothyroidism   . Asthma     seasonal allergies - enviromental allergies   . Chronic kidney disease     interstitial cystitis   . Arthritis     degenerative cerv. spine , beginning signs of carpal tunnel syndrome- L hand    . Headache(784.0)     migraines   . Family history of anesthesia  complication     N&V- mother & sister   Past Surgical History  Procedure Laterality Date  . Hiatal hernia repair    . Cholecystectomy      Lap approach  . Anterior cervical decomp/discectomy fusion N/A 05/08/2012    Procedure: ACDF C5-7  ANTERIOR CERVICAL DECOMPRESSION/DISCECTOMY FUSION 2 LEVELS;  Surgeon: Melina Schools, MD;  Location: [facility];  Service: Orthopedics;  Laterality: N/A;  . Abdominal hysterectomy     No family history on file. History  Substance Use Topics  . Smoking status: Never Smoker   . Smokeless tobacco: Not on file  . Alcohol Use: No   OB History    No data available     Review of Systems  Constitutional: Negative for fever.  Respiratory: Negative for cough and shortness of breath.   Gastrointestinal: Positive for nausea, vomiting, abdominal pain, diarrhea and anorexia.  Genitourinary: Positive for dysuria.  All other systems reviewed and are negative.     Allergies  Codeine; Wheat bran; Latex; Milk-related compounds; Peanuts; Soybean-containing drug products; and Tramadol  Home Medications   Prior to Admission medications   Medication Sig Start Date End Date Taking? Authorizing Provider  citalopram (CELEXA) 20 MG tablet Take 30 mg by mouth daily. 05/18/14  Yes Historical Provider, MD  Multiple Vitamin (MULTIVITAMIN) capsule Take 1 capsule by mouth daily.   Yes Historical Provider, MD  naproxen sodium (ANAPROX) 220 MG tablet Take 220 mg by mouth every 12 (twelve) hours as needed (pain).   Yes Historical Provider, MD  thyroid (ARMOUR) 90 MG tablet Take 90 mg by mouth daily.   Yes Historical Provider, MD  dicyclomine (BENTYL) 20 MG tablet Take 1 tablet (20 mg total) by mouth 3 (three) times daily as needed for spasms. 07/17/14   Dorie Rank, MD  docusate sodium (COLACE) 100 MG capsule Take 1 capsule (100 mg total) by mouth 3 (three) times daily as needed for constipation. Patient not taking: Reported on 07/17/2014 05/08/12   Melina Schools, MD  methocarbamol  (ROBAXIN) 500 MG tablet Take 1 tablet (500 mg total) by mouth 3 (three) times daily as needed. Patient not taking: Reported on 07/17/2014 05/08/12   Melina Schools, MD  ondansetron (ZOFRAN ODT) 8 MG disintegrating tablet Take 1 tablet (8 mg total) by mouth every 8 (eight) hours as needed for nausea or vomiting. 07/17/14   Dorie Rank, MD  ondansetron (ZOFRAN) 4 MG tablet Take 1 tablet (4 mg total) by mouth every 8 (eight) hours as needed for nausea. Patient not taking: Reported on 07/17/2014 05/08/12   Melina Schools, MD  polyethylene glycol powder (GLYCOLAX) powder Take 17 g by mouth daily. Patient not taking: Reported on 07/17/2014 05/08/12   Melina Schools, MD   BP 133/75 mmHg  Pulse 75  Temp(Src) 97.6 F (36.4 C) (Oral)  Resp 18  SpO2 98% Physical Exam  Constitutional: She appears well-developed and well-nourished. No distress.  HENT:  Head: Normocephalic and atraumatic.  Right Ear: External ear normal.  Left Ear: External ear normal.  Eyes: Conjunctivae are normal. Right eye exhibits no discharge. Left eye exhibits no discharge. No scleral icterus.  Neck: Neck supple. No tracheal deviation present.  Cardiovascular: Normal rate, regular rhythm and intact distal pulses.   Pulmonary/Chest: Effort normal and breath sounds normal. No stridor. No respiratory distress. She has no wheezes. She has no rales.  Abdominal: Soft. Bowel sounds are normal. She exhibits no distension. There is tenderness in the right lower quadrant. There is guarding. There is no rigidity and no rebound. No hernia.  Musculoskeletal: She exhibits no edema or tenderness.  Neurological: She is alert. She has normal strength. No cranial nerve deficit (no facial droop, extraocular movements intact, no slurred speech) or sensory deficit. She exhibits normal muscle tone. She displays no seizure activity. Coordination normal.  Skin: Skin is warm and dry. No rash noted.  Psychiatric: She has a normal mood and affect.  Nursing note and  vitals reviewed.   ED Course  Procedures (including critical care time) Labs Review Labs Reviewed  COMPREHENSIVE METABOLIC PANEL - Abnormal; Notable for the following:    Glucose, Bld 100 (*)    All other components within normal limits  LIPASE, BLOOD - Abnormal; Notable for the following:    Lipase 21 (*)    All other components within normal limits  CBC WITH DIFFERENTIAL/PLATELET  URINALYSIS, ROUTINE W REFLEX MICROSCOPIC (NOT AT [facility])    Imaging Review Ct Abdomen Pelvis W Contrast  07/17/2014   CLINICAL DATA:  Right lower quadrant pain for 3 weeks, initial encounter  EXAM: CT ABDOMEN AND PELVIS WITH CONTRAST  TECHNIQUE: Multidetector CT imaging of the abdomen and pelvis was performed using the standard protocol following bolus administration of intravenous contrast.  CONTRAST:  131mL OMNIPAQUE IOHEXOL 300 MG/ML SOLN, 63mL OMNIPAQUE IOHEXOL 300 MG/ML SOLN  COMPARISON:  03/27/2013  FINDINGS: Gallbladder is surgically removed. A cyst is  again seen in the left lobe of the liver stable from the prior exam. The spleen, adrenal glands and pancreas are within normal limits. Kidneys are well visualized bilaterally without renal calculi or obstructive change. The appendix is within normal limits. No inflammatory changes are seen. The bladder is partially distended. No pelvic mass lesion is noted. Bony structures show no acute abnormality.  IMPRESSION: Hepatic cysts stable from previous exam.  No acute abnormality noted.   Electronically Signed   By: Inez Catalina M.D.   On: 07/17/2014 19:45    Medications  iohexol (OMNIPAQUE) 300 MG/ML solution 50 mL (50 mLs Oral Contrast Given 07/17/14 1925)  iohexol (OMNIPAQUE) 300 MG/ML solution 100 mL (100 mLs Intravenous Contrast Given 07/17/14 1925)  fentaNYL (SUBLIMAZE) injection 75 mcg (75 mcg Intravenous Given 07/17/14 2002)  ondansetron (ZOFRAN) injection 4 mg (4 mg Intravenous Given 07/17/14 2001)     MDM   Final diagnoses:  Abdominal pain, acute     No clear etiology for her symptoms.  Labs and CT scan are normal.  Sx have been ongoing for several weeks.  It is possible it could be related to IBS with her complaints of V,D and bloating.  Will dc home with bentyl and zofran.  Follow up with her PCP    Dorie Rank, MD 07/17/14 2028

## 2014-07-17 NOTE — ED Notes (Signed)
Awake. Verbally responsive. Resp even and unlabored. No audible adventitious breath sounds noted. ABC's intact. Abd soft/nondistended but tender to palpate to RLL. No N/V/D reported. IV saline lock patent and intact. Family at bedside.

## 2014-07-17 NOTE — ED Notes (Signed)
Patient transported to CT 

## 2014-07-17 NOTE — ED Notes (Addendum)
Awake. Verbally responsive. Resp even and unlabored. No audible adventitious breath sounds noted. ABC's intact. Abd soft/nondistended but tender to palpate in RLQ. BS (+) and active x4 quadrants. No N/V x 3 and diarrhea x6 reported.

## 2014-07-17 NOTE — Discharge Instructions (Signed)

## 2014-09-13 NOTE — H&P (Signed)
  Patient name  Latrese, Carolan DICTATION# [id number] CSN# [id number]  Darlyn Chamber, MD 09/13/2014 1:22 PM

## 2014-09-14 NOTE — H&P (Signed)
NAME:  Deborah Callahan, Deborah Callahan NO.:  [id number]  MEDICAL RECORD NO.:  [id number]  LOCATION:                                 FACILITY:  PHYSICIAN:  Darlyn Chamber, M.D.   DATE OF BIRTH:  03/09/1960  DATE OF ADMISSION: DATE OF DISCHARGE:                             HISTORY & PHYSICAL   DATE OF SURGERY:  September 23, 2014.  HISTORY OF PRESENT ILLNESS:  The patient is a 54 year old, gravida 2, para 2 female who presents for an anterior repair.  Possible posterior repair with sacrospinous ligament suspension.  The patient has had trouble with worsening symptomatic cystocele.  We have done a urological evaluation, it really did not demonstrate any stress incontinence even after we reduced the cystocele.  Therefore in view of that, she now presents for the above-noted repair work, other alternatives such as pessaries were discussed.  ALLERGIES:  She is allergic to CODEINE, PEANUTS, WHEAT, SOY, and LACTAID.  MEDICATIONS:  She is on Armour Thyroid 90 mg, Celexa 20 mg, and AZO when she needs for her bladder.  PAST MEDICAL HISTORY:  She has a history of interstitial cystitis, been followed by Dr. Amalia Hailey.  She does have a history of hypothyroidism for which she is on medications as noted.  PAST SURGICAL HISTORY:  She has had 2 vaginal deliveries.  She has had a rotator cuff repair.  She had a laparoscopic cholecystectomy.  She had a previous diagnostic laparoscopy.  She had a laparoscopy in 1989 with bilateral tubal ligation.  In last year, she had a hysteroscopy and a posterior repair.  It is noted that in 2006, she had a laparoscopic- assisted vaginal hysterectomy and release of the posterior vaginal scarring.  SOCIAL HISTORY:  Reveals no tobacco or alcohol use.  FAMILY HISTORY:  Noncontributory.  REVIEW OF SYSTEMS:  Noncontributory.  PHYSICAL EXAMINATION:  VITAL SIGNS:  The patient is afebrile, stable vital signs. HEENT:  The patient is normocephalic.  Pupils are  equal, round, and reactive to light and accommodation.  Extraocular movements were intact. Sclerae and conjunctivae were clear.  Oropharynx is clear. NECK:  Not examined. BREASTS:  Not examined. LUNGS:  Clear. CARDIOVASCULAR:  Regular rhythm and rate without murmurs or gallops. ABDOMEN:  Benign.  No mass, organomegaly, or tenderness. PELVIC:  Normal external genitalia.  She has a marked severe cystocele, minimal rectocele, and fairly good vaginal cuff support.  Bimanual exam is unremarkable. EXTREMITIES:  Trace edema. NEUROLOGIC:  Grossly within normal limits.  IMPRESSION:  Worsening pelvic relaxation mainly in the form of cystocele.  PLAN:  The patient will undergo an anterior repair.  We may do a posterior repair and sacrospinous ligament suspension depending on what we find.  The nature of the procedure have been discussed.  The risks have been explained including the risk of infection.  Risk of hemorrhage that could require transfusion with the risk of AIDS or hepatitis.  Risk of injury to adjacent organs including bladder, bowel, or ureters that could require further exploratory surgery.  Risk of deep venous thrombosis and pulmonary embolus.  The patient expressed understanding of potential risks, complications, and alternatives.     Gwynn Burly.  Radene Knee, M.D.     JSM/MEDQ  D:  09/13/2014  T:  09/14/2014  Job:  [id number]

## 2014-09-20 ENCOUNTER — Encounter (HOSPITAL_BASED_OUTPATIENT_CLINIC_OR_DEPARTMENT_OTHER): Payer: Self-pay | Admitting: *Deleted

## 2014-09-21 ENCOUNTER — Encounter (HOSPITAL_BASED_OUTPATIENT_CLINIC_OR_DEPARTMENT_OTHER): Payer: Self-pay | Admitting: *Deleted

## 2014-09-21 NOTE — Progress Notes (Signed)
NPO AFTER MN.  ARRIVE AT 0600.  NEEDS CBC. WILL TAKE CELEXA AND ARMOUR THYROID AM DOS W/ SIPS OF WATER.  PT AWARE OWER AT [facility] MAIN.

## 2014-09-23 ENCOUNTER — Ambulatory Visit (HOSPITAL_BASED_OUTPATIENT_CLINIC_OR_DEPARTMENT_OTHER): Payer: BLUE CROSS/BLUE SHIELD | Admitting: Anesthesiology

## 2014-09-23 ENCOUNTER — Encounter (HOSPITAL_BASED_OUTPATIENT_CLINIC_OR_DEPARTMENT_OTHER): Payer: Self-pay | Admitting: *Deleted

## 2014-09-23 ENCOUNTER — Observation Stay (HOSPITAL_BASED_OUTPATIENT_CLINIC_OR_DEPARTMENT_OTHER)
Admission: RE | Admit: 2014-09-23 | Discharge: 2014-09-24 | Disposition: A | Discharge status: Home / Self Care | Payer: BLUE CROSS/BLUE SHIELD | Source: Ambulatory Visit | Attending: Obstetrics and Gynecology | Admitting: Obstetrics and Gynecology

## 2014-09-23 ENCOUNTER — Encounter (HOSPITAL_COMMUNITY)
Admission: RE | Disposition: A | Discharge status: Home / Self Care | Payer: Self-pay | Source: Ambulatory Visit | Attending: Obstetrics and Gynecology

## 2014-09-23 DIAGNOSIS — Z79899 Other long term (current) drug therapy: Secondary | ICD-10-CM | POA: Diagnosis not present

## 2014-09-23 DIAGNOSIS — Y838 Other surgical procedures as the cause of abnormal reaction of the patient, or of later complication, without mention of misadventure at the time of the procedure: Secondary | ICD-10-CM | POA: Diagnosis not present

## 2014-09-23 DIAGNOSIS — N8189 Other female genital prolapse: Secondary | ICD-10-CM | POA: Diagnosis present

## 2014-09-23 DIAGNOSIS — N9971 Accidental puncture and laceration of a genitourinary system organ or structure during a genitourinary system procedure: Secondary | ICD-10-CM | POA: Insufficient documentation

## 2014-09-23 DIAGNOSIS — J45909 Unspecified asthma, uncomplicated: Secondary | ICD-10-CM | POA: Diagnosis not present

## 2014-09-23 DIAGNOSIS — K449 Diaphragmatic hernia without obstruction or gangrene: Secondary | ICD-10-CM | POA: Diagnosis not present

## 2014-09-23 DIAGNOSIS — E039 Hypothyroidism, unspecified: Secondary | ICD-10-CM | POA: Diagnosis not present

## 2014-09-23 DIAGNOSIS — N8110 Cystocele, unspecified: Principal | ICD-10-CM | POA: Insufficient documentation

## 2014-09-23 DIAGNOSIS — Y92530 Ambulatory surgery center as the place of occurrence of the external cause: Secondary | ICD-10-CM | POA: Diagnosis not present

## 2014-09-23 HISTORY — DX: Presence of spectacles and contact lenses: Z97.3

## 2014-09-23 HISTORY — DX: Gastro-esophageal reflux disease without esophagitis: K21.9

## 2014-09-23 HISTORY — DX: Reserved for concepts with insufficient information to code with codable children: IMO0002

## 2014-09-23 HISTORY — DX: Personal history of other diseases of the digestive system: Z87.19

## 2014-09-23 HISTORY — DX: Interstitial cystitis (chronic) without hematuria: N30.10

## 2014-09-23 HISTORY — PX: ANTERIOR AND POSTERIOR REPAIR: SHX5121

## 2014-09-23 HISTORY — DX: Other female genital prolapse: N81.89

## 2014-09-23 HISTORY — DX: Mild intermittent asthma, uncomplicated: J45.20

## 2014-09-23 HISTORY — DX: Other allergic rhinitis: J30.89

## 2014-09-23 HISTORY — DX: Irritable bowel syndrome, unspecified: K58.9

## 2014-09-23 LAB — CBC
HCT: 42.3 % (ref 36.0–46.0)
Hemoglobin: 14.4 g/dL (ref 12.0–15.0)
MCH: 31.6 pg (ref 26.0–34.0)
MCHC: 34 g/dL (ref 30.0–36.0)
MCV: 92.8 fL (ref 78.0–100.0)
Platelets: 198 10*3/uL (ref 150–400)
RBC: 4.56 MIL/uL (ref 3.87–5.11)
RDW: 12.2 % (ref 11.5–15.5)
WBC: 4.7 10*3/uL (ref 4.0–10.5)

## 2014-09-23 SURGERY — ANTERIOR (CYSTOCELE) AND POSTERIOR REPAIR (RECTOCELE)
Anesthesia: General

## 2014-09-23 MED ORDER — MENTHOL 3 MG MT LOZG
1.0000 | LOZENGE | OROMUCOSAL | Status: DC | PRN
  Filled 2014-09-23: qty 9

## 2014-09-23 MED ORDER — DIPHENHYDRAMINE HCL 50 MG/ML IJ SOLN: 12.5000 mg | Freq: Four times a day (QID) | INTRAMUSCULAR | Status: DC | PRN

## 2014-09-23 MED ORDER — SODIUM CHLORIDE 0.9 % IJ SOLN: 9.0000 mL | INTRAMUSCULAR | Status: DC | PRN

## 2014-09-23 MED ORDER — FENTANYL CITRATE (PF) 100 MCG/2ML IJ SOLN
INTRAMUSCULAR | Status: AC
  Filled 2014-09-23: qty 2

## 2014-09-23 MED ORDER — PROMETHAZINE HCL 25 MG/ML IJ SOLN
6.2500 mg | INTRAMUSCULAR | Status: DC | PRN
  Filled 2014-09-23: qty 1

## 2014-09-23 MED ORDER — LACTATED RINGERS IV SOLN
INTRAVENOUS | Status: DC
  Filled 2014-09-23: qty 1000

## 2014-09-23 MED ORDER — LACTATED RINGERS IV SOLN
INTRAVENOUS | Status: DC
  Administered 2014-09-23: 1000 mL via INTRAVENOUS
  Administered 2014-09-23: 13:00:00 via INTRAVENOUS
  Administered 2014-09-24: 1000 mL via INTRAVENOUS

## 2014-09-23 MED ORDER — ROCURONIUM BROMIDE 100 MG/10ML IV SOLN
INTRAVENOUS | Status: DC | PRN
  Administered 2014-09-23: 10 mg via INTRAVENOUS
  Administered 2014-09-23: 20 mg via INTRAVENOUS

## 2014-09-23 MED ORDER — DIPHENHYDRAMINE HCL 50 MG/ML IJ SOLN
INTRAMUSCULAR | Status: DC | PRN
  Administered 2014-09-23: 12.5 mg via INTRAVENOUS

## 2014-09-23 MED ORDER — NALOXONE HCL 0.4 MG/ML IJ SOLN: 0.4000 mg | INTRAMUSCULAR | Status: DC | PRN

## 2014-09-23 MED ORDER — ONDANSETRON HCL 4 MG/2ML IJ SOLN
4.0000 mg | Freq: Four times a day (QID) | INTRAMUSCULAR | Status: DC | PRN
  Administered 2014-09-23 – 2014-09-24 (×2): 4 mg via INTRAVENOUS

## 2014-09-23 MED ORDER — KETOROLAC TROMETHAMINE 30 MG/ML IJ SOLN
INTRAMUSCULAR | Status: DC | PRN
  Administered 2014-09-23: 30 mg via INTRAVENOUS

## 2014-09-23 MED ORDER — CEFAZOLIN SODIUM-DEXTROSE 2-3 GM-% IV SOLR
2.0000 g | INTRAVENOUS | Status: AC
  Administered 2014-09-23: 2 g via INTRAVENOUS
  Filled 2014-09-23: qty 50

## 2014-09-23 MED ORDER — CEFAZOLIN SODIUM-DEXTROSE 2-3 GM-% IV SOLR
INTRAVENOUS | Status: AC
  Filled 2014-09-23: qty 50

## 2014-09-23 MED ORDER — FENTANYL CITRATE (PF) 100 MCG/2ML IJ SOLN
INTRAMUSCULAR | Status: AC
  Filled 2014-09-23: qty 4

## 2014-09-23 MED ORDER — HYDROMORPHONE 0.3 MG/ML IV SOLN
INTRAVENOUS | Status: DC
  Administered 2014-09-23: 1.69 mg via INTRAVENOUS
  Administered 2014-09-23: 3.39 mg via INTRAVENOUS
  Administered 2014-09-23: 13:00:00 via INTRAVENOUS
  Filled 2014-09-23: qty 25

## 2014-09-23 MED ORDER — EPHEDRINE SULFATE 50 MG/ML IJ SOLN
INTRAMUSCULAR | Status: DC | PRN
  Administered 2014-09-23: 15 mg via INTRAVENOUS

## 2014-09-23 MED ORDER — FENTANYL CITRATE (PF) 100 MCG/2ML IJ SOLN
25.0000 ug | INTRAMUSCULAR | Status: AC | PRN
  Administered 2014-09-23 (×6): 25 ug via INTRAVENOUS
  Filled 2014-09-23: qty 1

## 2014-09-23 MED ORDER — FENTANYL CITRATE (PF) 100 MCG/2ML IJ SOLN
INTRAMUSCULAR | Status: DC | PRN
  Administered 2014-09-23: 25 ug via INTRAVENOUS
  Administered 2014-09-23 (×3): 50 ug via INTRAVENOUS
  Administered 2014-09-23: 25 ug via INTRAVENOUS

## 2014-09-23 MED ORDER — ONDANSETRON HCL 4 MG PO TABS: 4.0000 mg | ORAL_TABLET | Freq: Four times a day (QID) | ORAL | Status: DC | PRN

## 2014-09-23 MED ORDER — LACTATED RINGERS IV SOLN
INTRAVENOUS | Status: DC
  Administered 2014-09-23 (×2): via INTRAVENOUS
  Filled 2014-09-23: qty 1000

## 2014-09-23 MED ORDER — HYDROMORPHONE HCL 2 MG PO TABS
2.0000 mg | ORAL_TABLET | ORAL | Status: DC | PRN
  Administered 2014-09-24: 2 mg via ORAL
  Filled 2014-09-23: qty 1

## 2014-09-23 MED ORDER — LIDOCAINE HCL 4 % MT SOLN
OROMUCOSAL | Status: DC | PRN
  Administered 2014-09-23: 2 mL via TOPICAL

## 2014-09-23 MED ORDER — MIDAZOLAM HCL 5 MG/5ML IJ SOLN
INTRAMUSCULAR | Status: DC | PRN
  Administered 2014-09-23: 2 mg via INTRAVENOUS

## 2014-09-23 MED ORDER — LIDOCAINE-EPINEPHRINE (PF) 1 %-1:200000 IJ SOLN
INTRAMUSCULAR | Status: DC | PRN
  Administered 2014-09-23: 5 mL

## 2014-09-23 MED ORDER — ONDANSETRON HCL 4 MG/2ML IJ SOLN
INTRAMUSCULAR | Status: DC | PRN
  Administered 2014-09-23: 4 mg via INTRAVENOUS

## 2014-09-23 MED ORDER — DEXAMETHASONE SODIUM PHOSPHATE 4 MG/ML IJ SOLN
INTRAMUSCULAR | Status: DC | PRN
  Administered 2014-09-23: 10 mg via INTRAVENOUS

## 2014-09-23 MED ORDER — LIDOCAINE HCL (CARDIAC) 20 MG/ML IV SOLN
INTRAVENOUS | Status: DC | PRN
  Administered 2014-09-23 (×2): 50 mg via INTRAVENOUS

## 2014-09-23 MED ORDER — THYROID 30 MG PO TABS
90.0000 mg | ORAL_TABLET | Freq: Every morning | ORAL | Status: DC
  Administered 2014-09-24: 90 mg via ORAL
  Filled 2014-09-23: qty 1

## 2014-09-23 MED ORDER — ACETAMINOPHEN 10 MG/ML IV SOLN
INTRAVENOUS | Status: DC | PRN
Start: 2014-09-23 — End: 2014-09-23
  Administered 2014-09-23: 1000 mg via INTRAVENOUS

## 2014-09-23 MED ORDER — SUCCINYLCHOLINE CHLORIDE 20 MG/ML IJ SOLN
INTRAMUSCULAR | Status: DC | PRN
  Administered 2014-09-23: 100 mg via INTRAVENOUS

## 2014-09-23 MED ORDER — ONDANSETRON HCL 4 MG/2ML IJ SOLN
4.0000 mg | Freq: Four times a day (QID) | INTRAMUSCULAR | Status: DC | PRN
  Filled 2014-09-23 (×2): qty 2

## 2014-09-23 MED ORDER — METOCLOPRAMIDE HCL 5 MG/ML IJ SOLN
INTRAMUSCULAR | Status: DC | PRN
  Administered 2014-09-23: 5 mg via INTRAVENOUS

## 2014-09-23 MED ORDER — FENTANYL CITRATE (PF) 100 MCG/2ML IJ SOLN
INTRAMUSCULAR | Status: AC
Start: 2014-09-23 — End: 2014-09-23
  Filled 2014-09-23: qty 2

## 2014-09-23 MED ORDER — NEOSTIGMINE METHYLSULFATE 10 MG/10ML IV SOLN
INTRAVENOUS | Status: DC | PRN
  Administered 2014-09-23: 3 mg via INTRAVENOUS

## 2014-09-23 MED ORDER — DIPHENHYDRAMINE HCL 12.5 MG/5ML PO ELIX
12.5000 mg | ORAL_SOLUTION | Freq: Four times a day (QID) | ORAL | Status: DC | PRN
  Administered 2014-09-23 (×2): 12.5 mg via ORAL
  Filled 2014-09-23 (×2): qty 5

## 2014-09-23 MED ORDER — FLUORESCEIN SODIUM 10 % IJ SOLN
INTRAMUSCULAR | Status: DC | PRN
  Administered 2014-09-23: 500 mg via INTRAVENOUS

## 2014-09-23 MED ORDER — MEPERIDINE HCL 25 MG/ML IJ SOLN
6.2500 mg | INTRAMUSCULAR | Status: DC | PRN
  Filled 2014-09-23: qty 1

## 2014-09-23 MED ORDER — ACETAMINOPHEN 325 MG PO TABS: 650.0000 mg | ORAL_TABLET | ORAL | Status: DC | PRN

## 2014-09-23 MED ORDER — GLYCOPYRROLATE 0.2 MG/ML IJ SOLN
INTRAMUSCULAR | Status: DC | PRN
  Administered 2014-09-23: 0.4 mg via INTRAVENOUS

## 2014-09-23 MED ORDER — PROPOFOL 10 MG/ML IV BOLUS
INTRAVENOUS | Status: DC | PRN
  Administered 2014-09-23: 50 mg via INTRAVENOUS
  Administered 2014-09-23: 150 mg via INTRAVENOUS

## 2014-09-23 MED ORDER — PENTOSAN POLYSULFATE SODIUM 100 MG PO CAPS
100.0000 mg | ORAL_CAPSULE | Freq: Three times a day (TID) | ORAL | Status: DC
  Administered 2014-09-23 – 2014-09-24 (×3): 100 mg via ORAL
  Filled 2014-09-23 (×6): qty 1

## 2014-09-23 MED ORDER — MIDAZOLAM HCL 2 MG/2ML IJ SOLN
INTRAMUSCULAR | Status: AC
  Filled 2014-09-23: qty 2

## 2014-09-23 SURGICAL SUPPLY — 59 items
BAG URINE DRAINAGE (UROLOGICAL SUPPLIES) ×3 IMPLANT
BLADE SURG 15 STRL LF DISP TIS (BLADE) ×2 IMPLANT
BLADE SURG 15 STRL SS (BLADE) ×3
BNDG GAUZE ELAST 4 BULKY (GAUZE/BANDAGES/DRESSINGS) ×2 IMPLANT
CATH FOLEY 2WAY SLVR  5CC 14FR (CATHETERS)
CATH FOLEY 2WAY SLVR 5CC 14FR (CATHETERS) ×1 IMPLANT
CATH ROBINSON RED A/P 16FR (CATHETERS) ×1 IMPLANT
CATH SILICONE 16FRX5CC (CATHETERS) ×4 IMPLANT
COVER BACK TABLE 60X90IN (DRAPES) ×3 IMPLANT
COVER MAYO STAND STRL (DRAPES) ×6 IMPLANT
DECANTER SPIKE VIAL GLASS SM (MISCELLANEOUS) IMPLANT
DEVICE CAPIO SLIM SINGLE (INSTRUMENTS) ×2 IMPLANT
DEVICE CAPIO SUTURING (INSTRUMENTS)
DEVICE CAPIO SUTURING OPC (INSTRUMENTS) IMPLANT
DRAPE LG THREE QUARTER DISP (DRAPES) ×4 IMPLANT
DRAPE UNDERBUTTOCKS STRL (DRAPE) ×3 IMPLANT
GLOVE BIO SURGEON STRL SZ7 (GLOVE) ×2 IMPLANT
GLOVE BIOGEL PI IND STRL 6.5 (GLOVE) ×2 IMPLANT
GLOVE BIOGEL PI INDICATOR 6.5 (GLOVE) ×1
GLOVE INDICATOR 7.0 STRL GRN (GLOVE) ×2 IMPLANT
GLOVE INDICATOR 8.0 STRL GRN (GLOVE) ×2 IMPLANT
GLOVE SURG SS PI 6.5 STRL IVOR (GLOVE) ×2 IMPLANT
GLOVE SURG SS PI 7.5 STRL IVOR (GLOVE) ×6 IMPLANT
GLOVE SURG SS PI 8.0 STRL IVOR (GLOVE) ×2 IMPLANT
GOWN STRL REUS W/ TWL LRG LVL3 (GOWN DISPOSABLE) ×3 IMPLANT
GOWN STRL REUS W/TWL LRG LVL3 (GOWN DISPOSABLE) ×3
GOWN STRL REUS W/TWL XL LVL3 (GOWN DISPOSABLE) ×4 IMPLANT
HOLDER FOLEY CATH W/STRAP (MISCELLANEOUS) ×3 IMPLANT
LEGGING LITHOTOMY PAIR STRL (DRAPES) ×3 IMPLANT
LIQUID BAND (GAUZE/BANDAGES/DRESSINGS) ×1 IMPLANT
MANIFOLD NEPTUNE II (INSTRUMENTS) IMPLANT
NDL MAYO 6 CRC TAPER PT (NEEDLE) ×1 IMPLANT
NDL SPNL 22GX3.5 QUINCKE BK (NEEDLE) ×1 IMPLANT
NEEDLE HYPO 22GX1.5 SAFETY (NEEDLE) IMPLANT
NEEDLE MAYO 6 CRC TAPER PT (NEEDLE) IMPLANT
NEEDLE SPNL 22GX3.5 QUINCKE BK (NEEDLE) ×3 IMPLANT
NS IRRIG 1000ML POUR BTL (IV SOLUTION) ×1 IMPLANT
PACK BASIN DAY SURGERY FS (CUSTOM PROCEDURE TRAY) ×3 IMPLANT
PAD OB MATERNITY 4.3X12.25 (PERSONAL CARE ITEMS) ×3 IMPLANT
PAD PREP 24X48 CUFFED NSTRL (MISCELLANEOUS) ×3 IMPLANT
PLUG CATH AND CAP STER (CATHETERS) ×4 IMPLANT
SET IRRIG Y TYPE TUR BLADDER L (SET/KITS/TRAYS/PACK) ×2 IMPLANT
SUT CAPIO POLYGLYCOLIC (SUTURE) ×2 IMPLANT
SUT CHROMIC 2 0 SH (SUTURE) ×4 IMPLANT
SUT CHROMIC 3 0 SH 27 (SUTURE) ×2 IMPLANT
SUT GUT CHROMIC 3 0 (SUTURE) ×6 IMPLANT
SUT MON AB 3-0 SH 27 (SUTURE) IMPLANT
SUT MON AB 3-0 SH27 (SUTURE) IMPLANT
SUT VIC AB 2-0 CT1 27 (SUTURE)
SUT VIC AB 2-0 CT1 TAPERPNT 27 (SUTURE) IMPLANT
SUT VIC AB 2-0 CT2 27 (SUTURE) ×13 IMPLANT
SUT VIC AB 2-0 SH 18 (SUTURE) ×2 IMPLANT
SYR CONTROL 10ML LL (SYRINGE) ×3 IMPLANT
TOWEL OR 17X24 6PK STRL BLUE (TOWEL DISPOSABLE) ×6 IMPLANT
TRAY DSU PREP LF (CUSTOM PROCEDURE TRAY) ×3 IMPLANT
TUBE CONNECTING 12X1/4 (SUCTIONS) ×3 IMPLANT
UPHOLD LITE CAPIO IMPLANT
WATER STERILE IRR 500ML POUR (IV SOLUTION) ×3 IMPLANT
YANKAUER SUCT BULB TIP NO VENT (SUCTIONS) ×3 IMPLANT

## 2014-09-23 NOTE — Transfer of Care (Signed)
Last Vitals:  Filed Vitals:   09/23/14 0633  BP: 123/71  Pulse: 67  Temp: 37.2 C  Resp: 18   Immediate Anesthesia Transfer of Care Note  Patient: Deborah Callahan  Procedure(s) Performed: Procedure(s): ANTERIOR (CYSTOCELE) AND POSTERIOR REPAIR (RECTOCELE)  Patient Location: PACU  Anesthesia Type: General  Level of Consciousness: awake, alert  and oriented  Airway & Oxygen Therapy: Patient Spontanous Breathing and Patient connected to face mask oxygen  Post-op Assessment: Report given to PACU RN and Post -op Vital signs reviewed and stable  Post vital signs: Reviewed and stable  Complications: No apparent anesthesia complications

## 2014-09-23 NOTE — Anesthesia Procedure Notes (Signed)
Procedure Name: Intubation Date/Time: 09/23/2014 7:37 AM Performed by: Mechele Claude Pre-anesthesia Checklist: Patient identified, Emergency Drugs available, Suction available and Patient being monitored Patient Re-evaluated:Patient Re-evaluated prior to inductionOxygen Delivery Method: Circle System Utilized Preoxygenation: Pre-oxygenation with 100% oxygen Intubation Type: IV induction Ventilation: Mask ventilation without difficulty Laryngoscope Size: Mac and 3 Grade View: Grade I Tube type: Oral Tube size: 7.0 mm Number of attempts: 1 Airway Equipment and Method: Stylet and LTA kit utilized Placement Confirmation: ETT inserted through vocal cords under direct vision,  positive ETCO2 and breath sounds checked- equal and bilateral Secured at: 20 cm Tube secured with: Tape Dental Injury: Teeth and Oropharynx as per pre-operative assessment

## 2014-09-23 NOTE — Brief Op Note (Signed)
09/23/2014  9:18 AM  PATIENT:  Deborah Callahan  54 y.o. female  PRE-OPERATIVE DIAGNOSIS:  cystocele  POST-OPERATIVE DIAGNOSIS:  cystocele  PROCEDURE:  Procedure(s): ANTERIOR (CYSTOCELE) AND POSTERIOR REPAIR (RECTOCELE)  SURGEON:  Surgeon(s) and Role:    * Arvella Nigh, MD - Primary    * Molli Posey, MD - Assisting  PHYSICIAN ASSISTANT:   ASSISTANTS: holland   ANESTHESIA:   local and general  EBL:  Total I/O In: 1300 [I.V.:1300] Out: 100 [Blood:100]  BLOOD ADMINISTERED:none  DRAINS: Urinary Catheter (Foley)   LOCAL MEDICATIONS USED:  XYLOCAINE   SPECIMEN:  No Specimen  DISPOSITION OF SPECIMEN:  N/A  COUNTS:  YES  TOURNIQUET:  * No tourniquets in log *  DICTATION: .Other Dictation: Dictation Number [id number]  PLAN OF CARE: Admit for overnight observation  PATIENT DISPOSITION:  PACU - hemodynamically stable.   Delay start of Pharmacological VTE agent (>24hrs) due to surgical blood loss or risk of bleeding: no

## 2014-09-23 NOTE — H&P (Signed)
  History and physical exam unchanged 

## 2014-09-23 NOTE — Op Note (Signed)
NAME:  Deborah, Callahan NO.:  [id number]  MEDICAL RECORD NO.:  [id number]  LOCATION:                                 FACILITY:  PHYSICIAN:  Darlyn Chamber, M.D.   DATE OF BIRTH:  11-27-1960  DATE OF PROCEDURE:  09/23/2014 DATE OF DISCHARGE:                              OPERATIVE REPORT   PREOPERATIVE DIAGNOSIS:  Symptomatic pelvic relaxation.  POSTOPERATIVE DIAGNOSES:  Symptomatic pelvic relaxation.  PROCEDURE:  Anterior colporrhaphy.  Posterior repair with sacrospinous ligament suspension and cystoscopy.  Repair of cystotomy incision.  SURGEON:  Darlyn Chamber, M.D.  ASSISTANT:  Molli Posey, M.D.  ANESTHESIA:  General endotracheal.  BLOOD LOSS:  Approximately 250 mL.  PACKS:  Included vaginal pack.  DRAINS:  Urethral Foley.  INTRAOPERATIVE BLOOD REPLACED:  None.  COMPLICATIONS:  Included cystotomy.  DESCRIPTION OF PROCEDURE:  The patient was taken to the OR and placed in supine position.  After satisfactory level of general endotracheal anesthesia was obtained, the patient was placed in a dorsal lithotomy position using the Allen stirrups.  The perineum and vagina were prepped out with Betadine.  The patient was then draped in sterile field.  A weighted speculum was placed in the vaginal vault.  Bladder was emptied by in-and-out catheterization.  She had a very prominent cystocele but apical support was also poor.  Her posterior support was excellent.  We injected the vaginal mucosa with a solution of 1% Xylocaine with epinephrine.  Incision was made in the midline in the anterior vaginal mucosa and the vaginal mucosa was dissected off the underlying bladder. A small cystotomy incision was made in the lower part.  It was easily identified and first repaired it with interrupted sutures of 3-0 chromic and then a running suture of 3-0 chromic.  We distended the bladder and had no leakage from that site.  At this point in time, the cystocele  was repaired with interrupted sutures of 2-0 Vicryl.  The vaginal mucosa was trimmed and reapproximated with 2-0 Vicryl.  At this point in time, some of the cystocele had been repaired but the big issue was apical support. So, we decided to do a posterior repair  and attempt at sacrospinous ligament suspension.  The incision was made in the perineal skin and dissected up and excised.  Using sharp and blunt dissection, the vaginal mucosa was dissected from the underlying colon.  There was a lot of scar tissue and there really was no prominent rectocele.  We continued the dissection up to the vaginal apex.  We were able to get out laterally to the right sacrospinous ligament.  Using the Capio and 0 Vicryl, a sacrospinous ligament stent suture was attached to the right sacrospinous ligament.  There was really no rectocele repair.  So using a free Mayo needle, we attached it to the top of the vaginal cuff.  This was then tied down to the sacrospinous ligament.  We had good elevation of the vaginal cuff.  The vaginal mucosa posteriorly was then closed with interrupted sutures of 2-0 Vicryl.  Perineal body was rebuilt with 2-0 Vicryl, and the skin was closed with running subcuticular 2-0 Vicryl.  The Foley was then removed.  Cystoscope was put in place.  Both ureters were identified, noted to be having good jets of clear urine.  She was given fluorescein but jets were obviously before the fluorescein.  You could see the repair area is really calmest at the area at the bladder base.  It was intact and there was no spillage of fluid.  At this point in time, the Foley was placed to straight drain.  A Kerlix soaked in saline was then put into the vaginal vault.  The patient taken out of dorsal lithotomy position.  Once alert and extubated, transferred to recovery room in good condition.  Sponge, instrument, and needle count were reported correct by circulating nurse x2.     Darlyn Chamber,  M.D.     JSM/MEDQ  D:  09/23/2014  T:  09/23/2014  Job:  [id number]

## 2014-09-23 NOTE — Anesthesia Preprocedure Evaluation (Addendum)
Anesthesia Evaluation  Patient identified by MRN, date of birth, ID band Patient awake    Reviewed: Allergy & Precautions, NPO status , Patient's Chart, lab work & pertinent test results  History of Anesthesia Complications (+) PONV  Airway Mallampati: II  TM Distance: >3 FB Neck ROM: Full    Dental no notable dental hx. (+) Teeth Intact   Pulmonary asthma ,  breath sounds clear to auscultation  Pulmonary exam normal       Cardiovascular negative cardio ROS Normal cardiovascular examRhythm:Regular Rate:Normal     Neuro/Psych negative neurological ROS  negative psych ROS   GI/Hepatic Neg liver ROS, hiatal hernia,   Endo/Other  negative endocrine ROS  Renal/GU negative Renal ROS  negative genitourinary   Musculoskeletal negative musculoskeletal ROS (+)   Abdominal   Peds negative pediatric ROS (+)  Hematology negative hematology ROS (+)   Anesthesia Other Findings   Reproductive/Obstetrics negative OB ROS                           Anesthesia Physical Anesthesia Plan  ASA: II  Anesthesia Plan: General   Post-op Pain Management:    Induction: Intravenous  Airway Management Planned: LMA and Oral ETT  Additional Equipment:   Intra-op Plan:   Post-operative Plan: Extubation in OR  Informed Consent: I have reviewed the patients History and Physical, chart, labs and discussed the procedure including the risks, benefits and alternatives for the proposed anesthesia with the patient or authorized representative who has indicated his/her understanding and acceptance.   Dental advisory given  Plan Discussed with: CRNA  Anesthesia Plan Comments:         Anesthesia Quick Evaluation

## 2014-09-23 NOTE — Progress Notes (Signed)
Patient ID: Deborah Callahan, female   DOB: 1960/12/26, 54 y.o.   MRN: [id number] Af vss abd soft Good uo No bleeding

## 2014-09-24 DIAGNOSIS — N8110 Cystocele, unspecified: Secondary | ICD-10-CM | POA: Diagnosis not present

## 2014-09-24 LAB — CBC
HCT: 38 % (ref 36.0–46.0)
Hemoglobin: 12.5 g/dL (ref 12.0–15.0)
MCH: 31.3 pg (ref 26.0–34.0)
MCHC: 32.9 g/dL (ref 30.0–36.0)
MCV: 95.2 fL (ref 78.0–100.0)
Platelets: 188 10*3/uL (ref 150–400)
RBC: 3.99 MIL/uL (ref 3.87–5.11)
RDW: 12.4 % (ref 11.5–15.5)
WBC: 10.3 10*3/uL (ref 4.0–10.5)

## 2014-09-24 MED ORDER — HYDROMORPHONE HCL 2 MG PO TABS
2.0000 mg | ORAL_TABLET | ORAL | Status: DC | PRN
Start: 2014-09-24 — End: 2016-02-28

## 2014-09-24 MED ORDER — PENTOSAN POLYSULFATE SODIUM 100 MG PO CAPS: 100.0000 mg | ORAL_CAPSULE | Freq: Three times a day (TID) | ORAL | Status: DC

## 2014-09-24 MED ORDER — NITROFURANTOIN MACROCRYSTAL 100 MG PO CAPS: 100.0000 mg | ORAL_CAPSULE | Freq: Every day | ORAL | Status: DC

## 2014-09-24 NOTE — Anesthesia Postprocedure Evaluation (Signed)
  Anesthesia Post-op Note  Patient: Deborah Callahan  Procedure(s) Performed: Procedure(s): ANTERIOR (CYSTOCELE) AND POSTERIOR REPAIR (RECTOCELE)  Patient Location: PACU  Anesthesia Type: General  Level of Consciousness: awake and alert   Airway and Oxygen Therapy: Patient Spontanous Breathing  Post-op Pain: mild  Post-op Assessment: Post-op Vital signs reviewed, Patient's Cardiovascular Status Stable, Respiratory Function Stable, Patent Airway and No signs of Nausea or vomiting  Last Vitals:  Filed Vitals:   09/24/14 0500  BP: 130/73  Pulse: 101  Temp: 36.9 C  Resp: 16    Post-op Vital Signs: stable   Complications: No apparent anesthesia complications

## 2014-09-24 NOTE — Discharge Summary (Signed)
NAME:  Deborah Callahan, Deborah Callahan NO.:  [id number]  MEDICAL RECORD NO.:  [id number]  LOCATION:  6734                         FACILITY:  [facility]  PHYSICIAN:  Darlyn Chamber, M.D.   DATE OF BIRTH:  [birth year]  DATE OF ADMISSION:  09/23/2014 DATE OF DISCHARGE:  09/24/2014                              DISCHARGE SUMMARY   ADMITTING DIAGNOSIS:  Symptomatic pelvic relaxation.  DISCHARGE DIAGNOSIS:  Symptomatic pelvic relaxation.  OPERATIVE PROCEDURE:  Anterior colporrhaphy.  Posterior repair with sacrospinous ligament suspension and cystoscopy.  Repair of the cystotomy site.  For complete history and physical, please see dictated note.  COURSE IN THE HOSPITAL:  The patient underwent above-noted surgery. There was a small hole made in the bladder, it was repaired in double layers.  Subsequent cystoscopy did not reveal any leakage from the side. Both ureters were noted to be having jets and with no evidence of obstruction.  She did well during the day, did have some bladder discomfort.  We gave her Pyridium and also put her on Elmiron as she has a past history of interstitial cystitis which she seemed to do well.  She did have some nausea during the night, no true vomiting.  On the morning of discharge, she was afebrile with stable vital signs.  Her postop hemoglobin was 12.5.  Her abdomen was soft.  Bowel sounds were active.  She was passing flatus.  Had good urine output.  It was tense from the Pyridium but otherwise was clear.  In terms of complications, none encountered during stay in the hospital. The patient was discharged home in stable condition.  DISPOSITION:  She is going to go home with a catheter in place for 10 days.  At that point in time, we will perform a cystogram to make sure the bladder was intact.  She will go home with a leg bag, instructed on proper use.  She is to avoid heavy lifting, vaginal entrance, or driving of car.  She was instructed to call the  office should there be signs of infection in the form of fever.  She should report if persistent nausea and vomiting.  Excessive pain should be reported for heavy vaginal bleeding.  She is instructed of signs and symptoms of deep venous thrombosis and pulmonary embolus.  Regarding discharge, we will discharge her home on Dilaudid as needed for pain.  I will probably send her home on some Elmiron and take while we have the catheter in place, and I will probably send her home with a low dose of antibiotics.  We will see her back in the office early next week.     Darlyn Chamber, M.D.     JSM/MEDQ  D:  09/24/2014  T:  09/24/2014  Job:  [id number]

## 2014-09-24 NOTE — Progress Notes (Signed)
D/c morphine PCA, patient is tolerating regular diet.

## 2014-09-24 NOTE — Progress Notes (Signed)
Patient ID: Deborah Callahan, female   DOB: 14-Sep-1960, 54 y.o.   MRN: [id number] S: SOME NAUSEA  PASSING FLATUS O: AFF VSS      ABD SOFT       PACK REMOVED NO BLEEDING      GOOD UO      NO EVIDENCE OF DVT      GHB 12.5 A:  POD # 1  P:  D/C HOME

## 2014-09-24 NOTE — Discharge Summary (Signed)
  Patient name Deborah Callahan, Deborah Callahan DICTATION#  [id number] CSN# [id number]  [facility], MD 09/24/2014 7:28 AM

## 2014-09-27 ENCOUNTER — Encounter (HOSPITAL_BASED_OUTPATIENT_CLINIC_OR_DEPARTMENT_OTHER): Payer: Self-pay | Admitting: Obstetrics and Gynecology

## 2014-09-30 ENCOUNTER — Other Ambulatory Visit (HOSPITAL_COMMUNITY): Payer: Self-pay | Admitting: Obstetrics and Gynecology

## 2014-09-30 DIAGNOSIS — Z9889 Other specified postprocedural states: Secondary | ICD-10-CM

## 2014-10-01 ENCOUNTER — Ambulatory Visit (HOSPITAL_COMMUNITY)
Admission: RE | Admit: 2014-10-01 | Discharge: 2014-10-01 | Disposition: A | Discharge status: Home / Self Care | Payer: BLUE CROSS/BLUE SHIELD | Source: Ambulatory Visit | Attending: Obstetrics and Gynecology | Admitting: Obstetrics and Gynecology

## 2014-10-01 DIAGNOSIS — Z9889 Other specified postprocedural states: Secondary | ICD-10-CM | POA: Diagnosis not present

## 2015-04-11 ENCOUNTER — Other Ambulatory Visit: Payer: Self-pay | Admitting: Obstetrics and Gynecology

## 2015-04-11 DIAGNOSIS — N63 Unspecified lump in unspecified breast: Secondary | ICD-10-CM

## 2015-04-15 ENCOUNTER — Ambulatory Visit
Admission: RE | Admit: 2015-04-15 | Discharge: 2015-04-15 | Disposition: A | Discharge status: Home / Self Care | Payer: BLUE CROSS/BLUE SHIELD | Source: Ambulatory Visit | Attending: Obstetrics and Gynecology | Admitting: Obstetrics and Gynecology

## 2015-04-15 DIAGNOSIS — N63 Unspecified lump in unspecified breast: Secondary | ICD-10-CM

## 2016-01-31 ENCOUNTER — Other Ambulatory Visit: Payer: Self-pay | Admitting: Family Medicine

## 2016-01-31 DIAGNOSIS — M5416 Radiculopathy, lumbar region: Secondary | ICD-10-CM

## 2016-02-03 ENCOUNTER — Other Ambulatory Visit: Payer: Self-pay

## 2016-02-03 ENCOUNTER — Ambulatory Visit
Admission: RE | Admit: 2016-02-03 | Discharge: 2016-02-03 | Disposition: A | Discharge status: Home / Self Care | Payer: 59 | Source: Ambulatory Visit | Attending: Family Medicine | Admitting: Family Medicine

## 2016-02-03 DIAGNOSIS — M5416 Radiculopathy, lumbar region: Secondary | ICD-10-CM

## 2016-02-27 ENCOUNTER — Encounter: Payer: Self-pay | Admitting: *Deleted

## 2016-02-28 ENCOUNTER — Ambulatory Visit (INDEPENDENT_AMBULATORY_CARE_PROVIDER_SITE_OTHER): Payer: 59 | Admitting: Diagnostic Neuroimaging

## 2016-02-28 ENCOUNTER — Encounter: Payer: Self-pay | Admitting: Diagnostic Neuroimaging

## 2016-02-28 VITALS — BP 119/68 | HR 75 | Ht 66.0 in | Wt 155.4 lb

## 2016-02-28 DIAGNOSIS — R159 Full incontinence of feces: Secondary | ICD-10-CM

## 2016-02-28 DIAGNOSIS — R202 Paresthesia of skin: Secondary | ICD-10-CM | POA: Diagnosis not present

## 2016-02-28 DIAGNOSIS — R200 Anesthesia of skin: Secondary | ICD-10-CM

## 2016-02-28 NOTE — Patient Instructions (Signed)
-   I will check MRI scans and lab testing
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
## 2016-03-01 ENCOUNTER — Telehealth: Payer: Self-pay | Admitting: Diagnostic Neuroimaging

## 2016-03-01 LAB — HEMOGLOBIN A1C
Est. average glucose Bld gHb Est-mCnc: 103 mg/dL
Hgb A1c MFr Bld: 5.2 % (ref 4.8–5.6)

## 2016-03-01 LAB — ANGIOTENSIN CONVERTING ENZYME: Angio Convert Enzyme: 47 U/L (ref 14–82)

## 2016-03-01 LAB — PAN-ANCA
ANCA Proteinase 3: <3.5 U/mL (ref 0.0–3.5)
Atypical pANCA: <1:20 {titer}
C-ANCA: <1:20 {titer}
Myeloperoxidase Ab: <9 U/mL (ref 0.0–9.0)
P-ANCA: <1:20 {titer}

## 2016-03-01 LAB — ANA W/REFLEX IF POSITIVE: Anti Nuclear Antibody(ANA): NEGATIVE

## 2016-03-01 LAB — VITAMIN B12: Vitamin B-12: 1188 pg/mL (ref 232–1245)

## 2016-03-01 NOTE — Telephone Encounter (Signed)
I called the patient back to see what time on 1/24 she would like to have I had to leave a message for her to call me back.

## 2016-03-01 NOTE — Telephone Encounter (Signed)
Pt returned Emily's call. She said 1/24 would work for her. Please call

## 2016-03-06 DIAGNOSIS — M25561 Pain in right knee: Secondary | ICD-10-CM | POA: Diagnosis not present

## 2016-03-06 DIAGNOSIS — M25562 Pain in left knee: Secondary | ICD-10-CM | POA: Diagnosis not present

## 2016-03-06 DIAGNOSIS — M25511 Pain in right shoulder: Secondary | ICD-10-CM | POA: Diagnosis not present

## 2016-03-09 ENCOUNTER — Telehealth: Payer: Self-pay | Admitting: *Deleted

## 2016-03-09 NOTE — Telephone Encounter (Signed)
Per Dr Leta Baptist, spoke with patient and informed her that her lab results are normal. She is aware of MRI next week. She stated she would like her "neck surgeon" to be able to see results of MRIs. Advised her that he can since he is a Occupational psychologist. She stated she has an appt with him next week. She verbalized understanding, appreciation of call.

## 2016-03-12 ENCOUNTER — Telehealth: Payer: Self-pay | Admitting: Diagnostic Neuroimaging

## 2016-03-12 NOTE — Telephone Encounter (Signed)
Based on her symptoms and my clinical judgement, I recommend all three scans. -VRP

## 2016-03-12 NOTE — Telephone Encounter (Signed)
I just got off the phone with Deborah Callahan to remind her about her MRI appointment for this coming Wednesday for our [place] mobile unit. She informed me that she does not understand why she is having the MRI Thoracic spine w/wo contrast. She said she understands why she is having the Brain & Cervical but not Thoracic and if she doesn't need the Thoracic then she didn't want to have to pay for it.

## 2016-03-13 ENCOUNTER — Telehealth: Payer: Self-pay | Admitting: Diagnostic Neuroimaging

## 2016-03-13 DIAGNOSIS — M25511 Pain in right shoulder: Secondary | ICD-10-CM | POA: Diagnosis not present

## 2016-03-13 NOTE — Telephone Encounter (Signed)
Spoke with patient who stated Dr Theda Sers, orthopedics is doing MRI of her right shoulder today. She is asking if it is necessary to do MRI thoracic spine. This RN discussed reasons Dr Leta Baptist wants all three scans and advised her of differences between MRI of shoulder and MRI of T spine. Advised that all MRIs will help physicians determine cause of her pain, symptoms so she can be treated properly going forward. She verbalized understanding, appreciation of call.

## 2016-03-13 NOTE — Telephone Encounter (Signed)
Patient has appointment for MRI tomorrow for 3 areas of her body. She is questioning why she is having MRI Thoracic Spine Please call and advise.

## 2016-03-14 ENCOUNTER — Telehealth: Payer: Self-pay | Admitting: Diagnostic Neuroimaging

## 2016-03-14 ENCOUNTER — Ambulatory Visit: Payer: 59

## 2016-03-14 DIAGNOSIS — R200 Anesthesia of skin: Secondary | ICD-10-CM

## 2016-03-14 DIAGNOSIS — R159 Full incontinence of feces: Secondary | ICD-10-CM

## 2016-03-14 MED ORDER — ALPRAZOLAM 1 MG PO TABS: 1.0000 mg | ORAL_TABLET | ORAL | 0 refills | Status: DC | PRN

## 2016-03-14 NOTE — Telephone Encounter (Signed)
I am sorry to bother you again but can you please cancel the 3 MRI orders you just  put in today because I spoke to GI again and they said they could use the previous order and she is already scheduled off those orders.

## 2016-03-14 NOTE — Telephone Encounter (Signed)
Spoke with patient and informed her Xanax prescription has been faxed to [facility]. She repeated instructions for taking correctly; advised she needs a driver as she may feel sedated. She verbalized understanding, appreciation.

## 2016-03-14 NOTE — Telephone Encounter (Signed)
Dr. Leta Baptist can you please put 3 new orders in for Deborah Callahan. She could not have the exam's done here today due to she is very claustrophobia she states she needs to have the highest dose you can give her.. She is going to have the MRI Brain tomorrow at [place] but since the order were used here GI said they needed new orders put in so right now they are only holding the appointment for her until the new orders are put in.Marland Kitchen She will then have the MR Thoracic & MR Cervical on Sunday at GI. She will need enough to calm her nerves for both days.

## 2016-03-14 NOTE — Telephone Encounter (Signed)
MRI scans re-ordered. Xanax 1mg  ordered. -VRP

## 2016-03-14 NOTE — Telephone Encounter (Signed)
Noted, thank you!! She is now scheduling to have all 3 exams on Sunday 03/18/16 at [facility]

## 2016-03-16 DIAGNOSIS — M2242 Chondromalacia patellae, left knee: Secondary | ICD-10-CM | POA: Diagnosis not present

## 2016-03-18 ENCOUNTER — Ambulatory Visit
Admission: RE | Admit: 2016-03-18 | Discharge: 2016-03-18 | Disposition: A | Discharge status: Home / Self Care | Payer: 59 | Source: Ambulatory Visit | Attending: Diagnostic Neuroimaging | Admitting: Diagnostic Neuroimaging

## 2016-03-18 ENCOUNTER — Ambulatory Visit: Admission: RE | Admit: 2016-03-18 | Payer: 59 | Source: Ambulatory Visit

## 2016-03-18 DIAGNOSIS — R200 Anesthesia of skin: Secondary | ICD-10-CM | POA: Diagnosis not present

## 2016-03-18 DIAGNOSIS — M4802 Spinal stenosis, cervical region: Secondary | ICD-10-CM | POA: Diagnosis not present

## 2016-03-18 DIAGNOSIS — R202 Paresthesia of skin: Secondary | ICD-10-CM | POA: Diagnosis not present

## 2016-03-18 MED ORDER — GADOBENATE DIMEGLUMINE 529 MG/ML IV SOLN
14.0000 mL | Freq: Once | INTRAVENOUS | Status: AC | PRN
  Administered 2016-03-18: 14 mL via INTRAVENOUS

## 2016-03-19 ENCOUNTER — Ambulatory Visit (INDEPENDENT_AMBULATORY_CARE_PROVIDER_SITE_OTHER): Payer: 59 | Admitting: Podiatry

## 2016-03-19 ENCOUNTER — Ambulatory Visit (INDEPENDENT_AMBULATORY_CARE_PROVIDER_SITE_OTHER): Payer: 59

## 2016-03-19 ENCOUNTER — Encounter: Payer: Self-pay | Admitting: Podiatry

## 2016-03-19 DIAGNOSIS — M722 Plantar fascial fibromatosis: Secondary | ICD-10-CM

## 2016-03-19 DIAGNOSIS — M79671 Pain in right foot: Secondary | ICD-10-CM | POA: Diagnosis not present

## 2016-03-19 DIAGNOSIS — M25511 Pain in right shoulder: Secondary | ICD-10-CM | POA: Diagnosis not present

## 2016-03-19 DIAGNOSIS — M79672 Pain in left foot: Secondary | ICD-10-CM

## 2016-03-19 MED ORDER — TRIAMCINOLONE ACETONIDE 10 MG/ML IJ SUSP
10.0000 mg | Freq: Once | INTRAMUSCULAR | Status: AC
  Administered 2016-03-19: 10 mg

## 2016-03-19 NOTE — Patient Instructions (Signed)

## 2016-03-19 NOTE — Progress Notes (Signed)
   Subjective:    Patient ID: Deborah Callahan, female    DOB: 07/08/1960, 56 y.o.   MRN: [id number]  HPI Chief Complaint  Patient presents with  . Foot Pain    Bilateral; knots in arch; pt stated, "Pain started September 2017; has knots in arch; Left foot is worse than Right foot"      Review of Systems  Constitutional: Positive for fatigue and unexpected weight change.  HENT: Positive for tinnitus.   Eyes: Positive for visual disturbance.  Respiratory: Positive for chest tightness.   Gastrointestinal: Positive for abdominal distention, constipation and diarrhea.  Endocrine: Positive for polyuria.  Genitourinary: Positive for frequency and urgency.  Musculoskeletal: Positive for back pain, gait problem, joint swelling and myalgias.  All other systems reviewed and are negative.      Objective:   Physical Exam        Assessment & Plan:

## 2016-03-20 DIAGNOSIS — M2242 Chondromalacia patellae, left knee: Secondary | ICD-10-CM | POA: Diagnosis not present

## 2016-03-21 NOTE — Progress Notes (Signed)
Subjective:     Patient ID: Deborah Callahan, female   DOB: 26-Apr-1960, 56 y.o.   MRN: [id number]  HPI patient presents with pain in the heels of both feet stating that it's been getting worse gradually over the last few months and that she's tried shoe gear modifications over-the-counter insoles   Review of Systems  All other systems reviewed and are negative.      Objective:   Physical Exam  Constitutional: She is oriented to person, place, and time.  Cardiovascular: Intact distal pulses.   Neurological: She is oriented to person, place, and time.  Skin: Skin is warm.  Nursing note and vitals reviewed.  neurovascular status intact muscle strength adequate range of motion within normal limits with exquisite discomfort heel region bilateral with inflammation fluid around the medial band. There is moderate depression of the arch patient's found to have good digital perfusion and is well oriented 3     Assessment:     Inflammatory fasciitis of the heel region bilateral with inflammation fluid around the medial band    Plan:     H&P x-rays performed conditions reviewed. Injected the plantar fascia bilateral 3 mg Kenalog 5 mill grams Xylocaine and applied fascial brace bilateral gave instructions on physical therapy supportive shoe gear usage and reappoint to recheck again in the next 2-3 weeks  X-ray report indicated spur formation with no indications of stress fracture or arthritis

## 2016-03-22 ENCOUNTER — Telehealth: Payer: Self-pay | Admitting: *Deleted

## 2016-03-22 ENCOUNTER — Telehealth: Payer: Self-pay | Admitting: Diagnostic Neuroimaging

## 2016-03-22 DIAGNOSIS — M4802 Spinal stenosis, cervical region: Secondary | ICD-10-CM

## 2016-03-22 NOTE — Telephone Encounter (Signed)
I called patient with MRI results. Patient has moderate spinal stenosis at C4-5; mild spinal stenosis and severe biforaminal stenosis at C3-4. These may be symptomatic  I recommend follow up with Dr. Melina Schools to evaluate for surgical treatment options (patient had ACDF by Dr. Rolena Infante in the past at C5-C7). -VRP

## 2016-03-22 NOTE — Telephone Encounter (Signed)
Per Dr Leta Baptist, spoke with patient and informed her that her MRI brain results are unremarkable with no major findings.  She verbalized understanding, appreciation. She then requested a copy of her MRI cervical spine result be faxed to Dr Melina Schools, orthopedic surgeon. She stated she has an appt there later this month.  MRI results faxed as requested.

## 2016-04-02 ENCOUNTER — Ambulatory Visit: Payer: 59 | Admitting: Podiatry

## 2016-04-02 DIAGNOSIS — Z4789 Encounter for other orthopedic aftercare: Secondary | ICD-10-CM | POA: Diagnosis not present

## 2016-04-02 DIAGNOSIS — M5412 Radiculopathy, cervical region: Secondary | ICD-10-CM | POA: Diagnosis not present

## 2016-04-09 DIAGNOSIS — M25511 Pain in right shoulder: Secondary | ICD-10-CM | POA: Diagnosis not present

## 2016-04-10 ENCOUNTER — Ambulatory Visit: Payer: 59 | Admitting: Diagnostic Neuroimaging

## 2016-06-22 DIAGNOSIS — R300 Dysuria: Secondary | ICD-10-CM | POA: Diagnosis not present

## 2016-06-22 DIAGNOSIS — M461 Sacroiliitis, not elsewhere classified: Secondary | ICD-10-CM | POA: Diagnosis not present

## 2016-06-28 DIAGNOSIS — T7840XA Allergy, unspecified, initial encounter: Secondary | ICD-10-CM | POA: Diagnosis not present

## 2016-06-28 DIAGNOSIS — H1013 Acute atopic conjunctivitis, bilateral: Secondary | ICD-10-CM | POA: Diagnosis not present

## 2016-08-27 DIAGNOSIS — R300 Dysuria: Secondary | ICD-10-CM | POA: Diagnosis not present

## 2016-08-27 DIAGNOSIS — K5900 Constipation, unspecified: Secondary | ICD-10-CM | POA: Diagnosis not present

## 2016-08-27 DIAGNOSIS — N3010 Interstitial cystitis (chronic) without hematuria: Secondary | ICD-10-CM | POA: Diagnosis not present

## 2018-06-13 ENCOUNTER — Telehealth: Payer: Self-pay

## 2018-06-13 NOTE — Telephone Encounter (Signed)
Called to confirm what type of virtual visit the pt preferred. Upon answering the pt hung up the phone.

## 2018-06-16 ENCOUNTER — Telehealth: Payer: Self-pay | Admitting: Cardiovascular Disease

## 2018-06-16 NOTE — Telephone Encounter (Signed)
Spoke with patient who confirmed all demographics. Patient was able to sign in to My Chart.  Has a smart phone. Will have vitals ready for visit.

## 2018-06-17 ENCOUNTER — Other Ambulatory Visit: Payer: Self-pay

## 2018-06-17 NOTE — Telephone Encounter (Signed)
ERROR

## 2018-06-18 ENCOUNTER — Other Ambulatory Visit: Payer: Self-pay

## 2018-06-18 ENCOUNTER — Encounter: Payer: Self-pay | Admitting: Cardiovascular Disease

## 2018-06-18 ENCOUNTER — Telehealth (INDEPENDENT_AMBULATORY_CARE_PROVIDER_SITE_OTHER): Payer: Self-pay | Admitting: Cardiovascular Disease

## 2018-06-18 VITALS — BP 144/82 | HR 77 | Ht 66.0 in | Wt 147.0 lb

## 2018-06-18 DIAGNOSIS — Z7189 Other specified counseling: Secondary | ICD-10-CM

## 2018-06-18 DIAGNOSIS — R0789 Other chest pain: Secondary | ICD-10-CM

## 2018-06-18 DIAGNOSIS — R079 Chest pain, unspecified: Secondary | ICD-10-CM | POA: Insufficient documentation

## 2018-06-18 NOTE — Progress Notes (Signed)
Virtual Visit via Video Note   This visit type was conducted due to national recommendations for restrictions regarding the COVID-19 Pandemic (e.g. social distancing) in an effort to limit this patient's exposure and mitigate transmission in our community.  Due to her co-morbid illnesses, this patient is at least at moderate risk for complications without adequate follow up.  This format is felt to be most appropriate for this patient at this time.  All issues noted in this document were discussed and addressed.  A limited physical exam was performed with this format.  Please refer to the patient's chart for her consent to telehealth for [facility].   Evaluation Performed:  Follow-up visit  Date:  06/18/2018   ID:  Deborah Callahan, DOB [birth year], MRN [id number]  Patient Location: Home Provider Location: Home  PCP:  Lawerance Cruel, MD  Cardiologist:   Sherial Ebrahim  Electrophysiologist:  None   Chief Complaint:  Chest pain   History of Present Illness:    Deborah Callahan is a 58 y.o. 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 Has had 8 episodes.   Occurs in the am .  Not related to exertion, not related to eating,  Occurs between 6 - 8:30 in the am  Lasted for 20 minutes Felt like a shock . Left arm numbness,  Arm was burning Associated with nausea and fatigue  No appetite.  Rides her bike and walks every day.   These pains are not associated with exercise  Has had neck surgery ( discectomy)  Her last TSH was 0.01.Marland Kitchen  Last TSH is 0.03  ( May 02, 2018)  She was changed from the [place] thyroid  is now on synthroid.  HDK = 72 Trigs = 65 LDL = 114  Total chol = 202     The patient does not have symptoms concerning for COVID-19 infection (fever, chills, cough, or new shortness of breath).    Past Medical History:  Diagnosis Date  . Arthritis    degenerative cerv. spine , beginning signs of carpal tunnel  syndrome- L hand    . Complication of anesthesia   . Cystocele   . Environmental and seasonal allergies   . Family history of anesthesia complication    N&V- mother & sister  . GERD (gastroesophageal reflux disease)    DIET CONTROLLED  . Headache(784.0)    migraines   . History of hiatal hernia   . Hypothyroidism   . IBS (irritable bowel syndrome)   . IC (interstitial cystitis)   . Mild intermittent asthma   . Pelvic relaxation   . PONV (postoperative nausea and vomiting)   . Wears glasses    Past Surgical History:  Procedure Laterality Date  . ANTERIOR AND POSTERIOR REPAIR  09/23/2014   Procedure: ANTERIOR (CYSTOCELE) AND POSTERIOR REPAIR (RECTOCELE);  Surgeon: Arvella Nigh, MD;  Location: [facility];  Service: Gynecology;;  . ANTERIOR CERVICAL DECOMP/DISCECTOMY FUSION N/A 05/08/2012   Procedure: ACDF C5-7  ANTERIOR CERVICAL DECOMPRESSION/DISCECTOMY FUSION 2 LEVELS;  Surgeon: Melina Schools, MD;  Location: [facility];  Service: Orthopedics;  Laterality: N/A;  . COLONOSCOPY  10-19-2005  . D & C HYSTEROSCOPE/ RESECTION POLYP/  POSTERIOR AND ENTEROOCELE REPAIRS/  CARDINAL UTEROSACRAL COLPOSUSPENSION /  PERINEAL BODY REPAIR  04-22-2003  . LAPAROSCOPIC ASSISTED VAGINAL HYSTERECTOMY  11-02-2004   w/  Release posterior vaginal scar  . LAPAROSCOPIC CHOLECYSTECTOMY  10-11-2001  . TUBAL  LIGATION  1988     No outpatient medications have been marked as taking for the 06/18/18 encounter (Appointment) with Emani Taussig, Wonda Cheng, MD.     Allergies:   Codeine; Wheat bran; Eggs or egg-derived products; Lactose intolerance (gi); Latex; Peanuts [peanut oil]; Soybean-containing drug products; Tramadol; and Corn-containing products   Social History   Tobacco Use  . Smoking status: Never Smoker  . Smokeless tobacco: Never Used  Substance Use Topics  . Alcohol use: Yes    Comment: OCCASIONAL WINE  . Drug use: No     Family Hx: The patient's family history includes Arthritis/Rheumatoid in  her mother; Cancer in her maternal grandmother, mother, and sister.  ROS:   Please see the history of present illness.     All other systems reviewed and are negative.   Prior CV studies:   The following studies were reviewed today:    Labs/Other Tests and Data Reviewed:    EKG:  An ECG dated 2014  was personally reviewed today and demonstrated:   NSR , no ST or T wave changes  Recent Labs: No results found for requested labs within last 8760 hours.   Recent Lipid Panel No results found for: CHOL, TRIG, HDL, CHOLHDL, LDLCALC, LDLDIRECT  Wt Readings from Last 3 Encounters:  02/28/16 155 lb 6.4 oz (70.5 kg)  09/23/14 139 lb (63 kg)  05/05/12 141 lb 8 oz (64.2 kg)     Objective:    Vital Signs:  There were no vitals taken for this visit.   VITAL SIGNS:  reviewed GEN:  no acute distress EYES:  sclerae anicteric, EOMI - Extraocular Movements Intact RESPIRATORY:  normal respiratory effort, symmetric expansion CARDIOVASCULAR:  no peripheral edema SKIN:  no rash, lesions or ulcers. MUSCULOSKELETAL:  no obvious deformities. NEURO:  alert and oriented x 3, no obvious focal deficit PSYCH:  normal affect  ASSESSMENT & PLAN:    1. Atypical chest pain: Diane was seen today by doxy visit.  She is the wife of another patient and friend of mine.  She has a history of atypical chest pain with left arm pain and numbness.  Her symptoms occur at rest and with twisting or turning her torso.  They are not related to exercise.  And she in fact she exercises quite vigorously every day and has not had any episodes of chest pain or shortness of breath associated with exercise.  I suspect that she has a cervical disc issue.  She also may have some shoulder impingement of the nerve.  I recommended that she consult with her primary medical doctor.  She also may want to see her orthopedist and may need to also see a neurosurgeon.  I will see her in the office for follow-up office in approximately 3  to 4 months.  She will call or send [place] messages by my chart if she has problems sooner than that.  COVID-19 Education: The signs and symptoms of COVID-19 were discussed with the patient and how to seek care for testing (follow up with PCP or arrange E-visit).  The importance of social distancing was discussed today.  Time:   Today, I have spent  35  minutes with the patient with telehealth technology discussing the above problems.   Additional 10 minutes in charting /precharting    Medication Adjustments/Labs and Tests Ordered: Current medicines are reviewed at length with the patient today.  Concerns regarding medicines are outlined above.   Tests Ordered: No orders of the defined types  were placed in this encounter.   Medication Changes: No orders of the defined types were placed in this encounter.   Disposition:  Follow up in 4 month(s)  Signed, Mertie Moores, MD  06/18/2018 7:16 AM    [facility]

## 2018-06-18 NOTE — Patient Instructions (Addendum)
Medication Instructions:  Your physician recommends that you continue on your current medications as directed. Please refer to the Current Medication list given to you today.   Lab work: None Ordered     Testing/Procedures: None Ordered    Follow-Up: Your physician recommends that you return for a follow-up appointment on Wednesday August 19 at 4:20 pm with Dr. Acie Fredrickson Please arrive at the office by 4:05 pm

## 2018-10-08 ENCOUNTER — Ambulatory Visit: Payer: Self-pay | Admitting: Cardiovascular Disease

## 2019-02-05 ENCOUNTER — Other Ambulatory Visit: Payer: Self-pay | Admitting: Orthopedic Surgery

## 2019-02-05 NOTE — Pre-Procedure Instructions (Signed)
Your procedure is scheduled on Monday, December 21st, from 1:00 PM to 3:43PM.  Report to [facility] Main Entrance "A" at 10:00 A.M., and check in at the Admitting office.  Call this number if you have problems the morning of surgery:  [phone]    Remember:  Do not eat after midnight the night before your surgery.  You may drink clear liquids until 10:00 AM the morning of your surgery.   Clear liquids allowed are: Water, Non-Citrus Juices (without pulp), Carbonated Beverages, Clear Tea, Black Coffee Only, and Gatorade    Take these medicines the morning of surgery with A SIP OF WATER: gabapentin (NEURONTIN) levothyroxine (SYNTHROID)  IF NEEDED: acetaminophen (TYLENOL 8 HOUR)  As of today, STOP taking any Aspirin (unless otherwise instructed by your surgeon), Aleve, Naproxen, Ibuprofen, Motrin, Advil, Goody's, BC's, all herbal medications, fish oil, and all vitamins.    The Morning of Surgery  Do not wear jewelry, make-up or nail polish.  Do not wear lotions, powders, perfumes, or deodorant  Do not shave 48 hours prior to surgery.   Do not bring valuables to the hospital.  [facility] is not responsible for any belongings or valuables.  If you are a smoker, DO NOT Smoke 24 hours prior to surgery  If you wear a CPAP at night please bring your mask, tubing, and machine the morning of surgery   Remember that you must have someone to transport you home after your surgery, and remain with you for 24 hours if you are discharged the same day.   Please bring cases for contacts, glasses, hearing aids, dentures or bridgework because it cannot be worn into surgery.    Leave your suitcase in the car.  After surgery it may be brought to your room.  For patients admitted to the hospital, discharge time will be determined by your treatment team.  Patients discharged the day of surgery will not be allowed to drive home.    Special instructions:   [place]- Preparing For  Surgery  Before surgery, you can play an important role. Because skin is not sterile, your skin needs to be as free of germs as possible. You can reduce the number of germs on your skin by washing with CHG (chlorahexidine gluconate) Soap before surgery.  CHG is an antiseptic cleaner which kills germs and bonds with the skin to continue killing germs even after washing.     Please do not use if you have an allergy to CHG or antibacterial soaps. If your skin becomes reddened/irritated stop using the CHG.  Do not shave (including legs and underarms) for at least 48 hours prior to first CHG shower. It is OK to shave your face.  Please follow these instructions carefully.   1. Shower the NIGHT BEFORE SURGERY and the MORNING OF SURGERY with CHG Soap.   2. If you chose to wash your hair, wash your hair first as usual with your normal shampoo.  3. After you shampoo, rinse your hair and body thoroughly to remove the shampoo.  4. Use CHG as you would any other liquid soap. You can apply CHG directly to the skin and wash gently with a scrungie or a clean washcloth.   5. Apply the CHG Soap to your body ONLY FROM THE NECK DOWN.  Do not use on open wounds or open sores. Avoid contact with your eyes, ears, mouth and genitals (private parts). Wash Face and genitals (private parts)  with your normal soap.  6. Wash thoroughly, paying special attention to the area where your surgery will be performed.  7. Thoroughly rinse your body with warm water from the neck down.  8. DO NOT shower/wash with your normal soap after using and rinsing off the CHG Soap.  9. Pat yourself dry with a CLEAN TOWEL.  10. Wear CLEAN PAJAMAS to bed the night before surgery, wear comfortable clothes the morning of surgery  11. Place CLEAN SHEETS on your bed the night of your first shower and DO NOT SLEEP WITH PETS.    Day of Surgery:  Remember to brush your teeth WITH YOUR REGULAR TOOTHPASTE. Please shower the morning of  surgery with the CHG soap Do not apply any deodorants/lotions. Please wear clean clothes to the hospital/surgery center.      Please read over the following fact sheets that you were given.

## 2019-02-06 ENCOUNTER — Encounter (HOSPITAL_COMMUNITY): Payer: Self-pay

## 2019-02-06 ENCOUNTER — Encounter (HOSPITAL_COMMUNITY)
Admission: RE | Admit: 2019-02-06 | Discharge: 2019-02-06 | Disposition: A | Discharge status: Home / Self Care | Payer: Self-pay | Source: Ambulatory Visit | Attending: Orthopedic Surgery | Admitting: Orthopedic Surgery

## 2019-02-06 ENCOUNTER — Other Ambulatory Visit (HOSPITAL_COMMUNITY)
Admission: RE | Admit: 2019-02-06 | Discharge: 2019-02-06 | Disposition: A | Discharge status: Home / Self Care | Payer: Self-pay | Source: Ambulatory Visit | Attending: Orthopedic Surgery | Admitting: Orthopedic Surgery

## 2019-02-06 ENCOUNTER — Other Ambulatory Visit: Payer: Self-pay

## 2019-02-06 DIAGNOSIS — Z01812 Encounter for preprocedural laboratory examination: Secondary | ICD-10-CM | POA: Insufficient documentation

## 2019-02-06 DIAGNOSIS — Z20828 Contact with and (suspected) exposure to other viral communicable diseases: Secondary | ICD-10-CM | POA: Insufficient documentation

## 2019-02-06 HISTORY — DX: Pneumonia, unspecified organism: J18.9

## 2019-02-06 HISTORY — DX: Personal history of urinary calculi: Z87.442

## 2019-02-06 LAB — URINALYSIS, ROUTINE W REFLEX MICROSCOPIC
Bilirubin Urine: NEGATIVE
Glucose, UA: NEGATIVE mg/dL
Hgb urine dipstick: NEGATIVE
Ketones, ur: NEGATIVE mg/dL
Leukocytes,Ua: NEGATIVE
Nitrite: NEGATIVE
Protein, ur: NEGATIVE mg/dL
Specific Gravity, Urine: 1.014 (ref 1.005–1.030)
pH: 6 (ref 5.0–8.0)

## 2019-02-06 LAB — CBC WITH DIFFERENTIAL/PLATELET
Abs Immature Granulocytes: 0.01 10*3/uL (ref 0.00–0.07)
Basophils Absolute: 0 10*3/uL (ref 0.0–0.1)
Basophils Relative: 1 %
Eosinophils Absolute: 0.1 10*3/uL (ref 0.0–0.5)
Eosinophils Relative: 3 %
HCT: 45.7 % (ref 36.0–46.0)
Hemoglobin: 15.7 g/dL — ABNORMAL HIGH (ref 12.0–15.0)
Immature Granulocytes: 0 %
Lymphocytes Relative: 45 %
Lymphs Abs: 1.8 10*3/uL (ref 0.7–4.0)
MCH: 32 pg (ref 26.0–34.0)
MCHC: 34.4 g/dL (ref 30.0–36.0)
MCV: 93.3 fL (ref 80.0–100.0)
Monocytes Absolute: 0.4 10*3/uL (ref 0.1–1.0)
Monocytes Relative: 9 %
Neutro Abs: 1.7 10*3/uL (ref 1.7–7.7)
Neutrophils Relative %: 42 %
Platelets: 238 10*3/uL (ref 150–400)
RBC: 4.9 MIL/uL (ref 3.87–5.11)
RDW: 11.6 % (ref 11.5–15.5)
WBC: 4.1 10*3/uL (ref 4.0–10.5)
nRBC: 0 % (ref 0.0–0.2)

## 2019-02-06 LAB — COMPREHENSIVE METABOLIC PANEL
ALT: 25 U/L (ref 0–44)
AST: 23 U/L (ref 15–41)
Albumin: 4.2 g/dL (ref 3.5–5.0)
Alkaline Phosphatase: 93 U/L (ref 38–126)
Anion gap: 9 (ref 5–15)
BUN: 11 mg/dL (ref 6–20)
CO2: 26 mmol/L (ref 22–32)
Calcium: 9.8 mg/dL (ref 8.9–10.3)
Chloride: 104 mmol/L (ref 98–111)
Creatinine, Ser: 0.69 mg/dL (ref 0.44–1.00)
GFR calc Af Amer: >60 mL/min (ref >60)
GFR calc non Af Amer: >60 mL/min (ref >60)
Glucose, Bld: 111 mg/dL — ABNORMAL HIGH (ref 70–99)
Potassium: 3.9 mmol/L (ref 3.5–5.1)
Sodium: 139 mmol/L (ref 135–145)
Total Bilirubin: 0.7 mg/dL (ref 0.3–1.2)
Total Protein: 7.3 g/dL (ref 6.5–8.1)

## 2019-02-06 LAB — PROTIME-INR
INR: 0.9 (ref 0.8–1.2)
Prothrombin Time: 12.1 seconds (ref 11.4–15.2)

## 2019-02-06 LAB — TYPE AND SCREEN
ABO/RH(D): A POS
Antibody Screen: NEGATIVE

## 2019-02-06 LAB — SURGICAL PCR SCREEN
MRSA, PCR: NEGATIVE
Staphylococcus aureus: NEGATIVE

## 2019-02-06 LAB — APTT: aPTT: 27 seconds (ref 24–36)

## 2019-02-06 LAB — SARS CORONAVIRUS 2 (TAT 6-24 HRS): SARS Coronavirus 2: NEGATIVE

## 2019-02-06 LAB — ABO/RH: ABO/RH(D): A POS

## 2019-02-06 NOTE — Progress Notes (Signed)
PCP - Antony Contras, MD (used to see Dr. Lona Kettle) Cardiologist - Denies Neurologist- Dr. Randol Kern  PPM/ICD - Denies  Chest x-ray - N/A EKG - N/A Stress Test - Denies ECHO - Denies Cardiac Cath - Denies  Sleep Study - Denies  Patient denies being a diabetic  Blood Thinner Instructions: N/A Aspirin Instructions: N/A  ERAS Protcol - Yes PRE-SURGERY Ensure or G2- Ensure  COVID TEST- 02/06/2019  Anesthesia review: No  Patient denies shortness of breath, fever, cough and chest pain at PAT appointment  Coronavirus Screening  Have you experienced the following symptoms:  Cough yes/no: No Fever (>100.56F)  yes/no: No Runny nose yes/no: No Sore throat yes/no: No Difficulty breathing/shortness of breath  yes/no: No  Have you or a family member traveled in the last 14 days and where? yes/no: No   If the patient indicates "YES" to the above questions, their PAT will be rescheduled to limit the exposure to others and, the surgeon will be notified. THE PATIENT WILL NEED TO BE ASYMPTOMATIC FOR 14 DAYS.   If the patient is not experiencing any of these symptoms, the PAT nurse will instruct them to NOT bring anyone with them to their appointment since they may have these symptoms or traveled as well.   Please remind your patients and families that hospital visitation restrictions are in effect and the importance of the restrictions.    All instructions explained to the patient, with a verbal understanding of the material. Patient agrees to go over the instructions while at home for a better understanding. Patient also instructed to self quarantine after being tested for COVID-19. The opportunity to ask questions was provided.

## 2019-02-09 ENCOUNTER — Ambulatory Visit (HOSPITAL_COMMUNITY): Payer: Self-pay

## 2019-02-09 ENCOUNTER — Ambulatory Visit (HOSPITAL_COMMUNITY): Payer: Self-pay | Admitting: Physician Assistant

## 2019-02-09 ENCOUNTER — Encounter (HOSPITAL_COMMUNITY)
Admission: RE | Disposition: A | Discharge status: Home / Self Care | Payer: Self-pay | Source: Home / Self Care | Attending: Orthopedic Surgery

## 2019-02-09 ENCOUNTER — Encounter (HOSPITAL_COMMUNITY): Payer: Self-pay | Admitting: Orthopedic Surgery

## 2019-02-09 ENCOUNTER — Other Ambulatory Visit: Payer: Self-pay

## 2019-02-09 ENCOUNTER — Ambulatory Visit (HOSPITAL_COMMUNITY)
Admission: RE | Admit: 2019-02-09 | Discharge: 2019-02-10 | Disposition: A | Discharge status: Home / Self Care | Payer: Self-pay | Attending: Orthopedic Surgery | Admitting: Orthopedic Surgery

## 2019-02-09 DIAGNOSIS — G43909 Migraine, unspecified, not intractable, without status migrainosus: Secondary | ICD-10-CM | POA: Insufficient documentation

## 2019-02-09 DIAGNOSIS — Z981 Arthrodesis status: Secondary | ICD-10-CM | POA: Insufficient documentation

## 2019-02-09 DIAGNOSIS — J4520 Mild intermittent asthma, uncomplicated: Secondary | ICD-10-CM | POA: Insufficient documentation

## 2019-02-09 DIAGNOSIS — Z888 Allergy status to other drugs, medicaments and biological substances status: Secondary | ICD-10-CM | POA: Insufficient documentation

## 2019-02-09 DIAGNOSIS — E739 Lactose intolerance, unspecified: Secondary | ICD-10-CM | POA: Insufficient documentation

## 2019-02-09 DIAGNOSIS — Z87442 Personal history of urinary calculi: Secondary | ICD-10-CM | POA: Insufficient documentation

## 2019-02-09 DIAGNOSIS — Z803 Family history of malignant neoplasm of breast: Secondary | ICD-10-CM | POA: Insufficient documentation

## 2019-02-09 DIAGNOSIS — Z887 Allergy status to serum and vaccine status: Secondary | ICD-10-CM | POA: Insufficient documentation

## 2019-02-09 DIAGNOSIS — Z79899 Other long term (current) drug therapy: Secondary | ICD-10-CM | POA: Insufficient documentation

## 2019-02-09 DIAGNOSIS — Z91010 Allergy to peanuts: Secondary | ICD-10-CM | POA: Insufficient documentation

## 2019-02-09 DIAGNOSIS — K589 Irritable bowel syndrome without diarrhea: Secondary | ICD-10-CM | POA: Insufficient documentation

## 2019-02-09 DIAGNOSIS — E039 Hypothyroidism, unspecified: Secondary | ICD-10-CM | POA: Insufficient documentation

## 2019-02-09 DIAGNOSIS — Z90710 Acquired absence of both cervix and uterus: Secondary | ICD-10-CM | POA: Insufficient documentation

## 2019-02-09 DIAGNOSIS — M1990 Unspecified osteoarthritis, unspecified site: Secondary | ICD-10-CM | POA: Insufficient documentation

## 2019-02-09 DIAGNOSIS — G959 Disease of spinal cord, unspecified: Secondary | ICD-10-CM | POA: Diagnosis present

## 2019-02-09 DIAGNOSIS — Z9049 Acquired absence of other specified parts of digestive tract: Secondary | ICD-10-CM | POA: Insufficient documentation

## 2019-02-09 DIAGNOSIS — Z91040 Latex allergy status: Secondary | ICD-10-CM | POA: Insufficient documentation

## 2019-02-09 DIAGNOSIS — Z885 Allergy status to narcotic agent status: Secondary | ICD-10-CM | POA: Insufficient documentation

## 2019-02-09 DIAGNOSIS — N3010 Interstitial cystitis (chronic) without hematuria: Secondary | ICD-10-CM | POA: Insufficient documentation

## 2019-02-09 DIAGNOSIS — G542 Cervical root disorders, not elsewhere classified: Secondary | ICD-10-CM | POA: Insufficient documentation

## 2019-02-09 DIAGNOSIS — K449 Diaphragmatic hernia without obstruction or gangrene: Secondary | ICD-10-CM | POA: Insufficient documentation

## 2019-02-09 DIAGNOSIS — Z419 Encounter for procedure for purposes other than remedying health state, unspecified: Secondary | ICD-10-CM

## 2019-02-09 DIAGNOSIS — K219 Gastro-esophageal reflux disease without esophagitis: Secondary | ICD-10-CM | POA: Insufficient documentation

## 2019-02-09 DIAGNOSIS — Z91018 Allergy to other foods: Secondary | ICD-10-CM | POA: Insufficient documentation

## 2019-02-09 DIAGNOSIS — Z8261 Family history of arthritis: Secondary | ICD-10-CM | POA: Insufficient documentation

## 2019-02-09 HISTORY — PX: ANTERIOR CERVICAL DECOMP/DISCECTOMY FUSION: SHX1161

## 2019-02-09 SURGERY — ANTERIOR CERVICAL DECOMPRESSION/DISCECTOMY FUSION 2 LEVELS
Anesthesia: General

## 2019-02-09 MED ORDER — METHOCARBAMOL 500 MG PO TABS
ORAL_TABLET | ORAL | Status: AC
  Administered 2019-02-09: 500 mg via ORAL
  Filled 2019-02-09: qty 1

## 2019-02-09 MED ORDER — ALUM & MAG HYDROXIDE-SIMETH 200-200-20 MG/5ML PO SUSP: 30.0000 mL | Freq: Four times a day (QID) | ORAL | Status: DC | PRN

## 2019-02-09 MED ORDER — PHENOL 1.4 % MT LIQD
1.0000 | OROMUCOSAL | Status: DC | PRN
  Filled 2019-02-09: qty 177

## 2019-02-09 MED ORDER — FENTANYL CITRATE (PF) 250 MCG/5ML IJ SOLN
INTRAMUSCULAR | Status: AC
  Filled 2019-02-09: qty 5

## 2019-02-09 MED ORDER — VITAMIN D3 25 MCG PO TABS
1000.0000 [IU] | ORAL_TABLET | Freq: Every day | ORAL | Status: DC
  Filled 2019-02-09: qty 1

## 2019-02-09 MED ORDER — HYDROMORPHONE HCL 1 MG/ML IJ SOLN
0.2500 mg | INTRAMUSCULAR | Status: DC | PRN
  Administered 2019-02-09: 0.5 mg via INTRAVENOUS

## 2019-02-09 MED ORDER — MIDAZOLAM HCL 2 MG/2ML IJ SOLN
INTRAMUSCULAR | Status: DC | PRN
  Administered 2019-02-09: 2 mg via INTRAVENOUS

## 2019-02-09 MED ORDER — PROMETHAZINE HCL 25 MG/ML IJ SOLN: 6.2500 mg | INTRAMUSCULAR | Status: DC | PRN

## 2019-02-09 MED ORDER — HYDROXYZINE HCL 25 MG PO TABS
50.0000 mg | ORAL_TABLET | ORAL | Status: DC | PRN
  Administered 2019-02-09: 50 mg via ORAL
  Filled 2019-02-09: qty 2

## 2019-02-09 MED ORDER — PROPOFOL 10 MG/ML IV BOLUS
INTRAVENOUS | Status: DC | PRN
  Administered 2019-02-09: 150 mg via INTRAVENOUS

## 2019-02-09 MED ORDER — DEXAMETHASONE SODIUM PHOSPHATE 10 MG/ML IJ SOLN
INTRAMUSCULAR | Status: DC | PRN
  Administered 2019-02-09: 10 mg via INTRAVENOUS

## 2019-02-09 MED ORDER — METHOCARBAMOL 1000 MG/10ML IJ SOLN
500.0000 mg | Freq: Four times a day (QID) | INTRAVENOUS | Status: DC | PRN
  Filled 2019-02-09: qty 5

## 2019-02-09 MED ORDER — BUPIVACAINE-EPINEPHRINE 0.25% -1:200000 IJ SOLN
INTRAMUSCULAR | Status: DC | PRN
  Administered 2019-02-09: 6 mL

## 2019-02-09 MED ORDER — VITAMIN D 25 MCG (1000 UNIT) PO TABS
1000.0000 [IU] | ORAL_TABLET | Freq: Every day | ORAL | Status: DC
  Administered 2019-02-09: 1000 [IU] via ORAL
  Filled 2019-02-09: qty 1

## 2019-02-09 MED ORDER — HYDROMORPHONE HCL 1 MG/ML IJ SOLN
INTRAMUSCULAR | Status: AC
  Administered 2019-02-09: 0.5 mg via INTRAVENOUS
  Filled 2019-02-09: qty 1

## 2019-02-09 MED ORDER — SCOPOLAMINE 1 MG/3DAYS TD PT72
MEDICATED_PATCH | TRANSDERMAL | Status: AC
  Administered 2019-02-09: 1.5 mg via TRANSDERMAL
  Filled 2019-02-09: qty 1

## 2019-02-09 MED ORDER — SODIUM CHLORIDE 0.9 % IV SOLN: 250.0000 mL | INTRAVENOUS | Status: DC

## 2019-02-09 MED ORDER — PHENYLEPHRINE HCL-NACL 10-0.9 MG/250ML-% IV SOLN
INTRAVENOUS | Status: DC | PRN
  Administered 2019-02-09: 50 ug/min via INTRAVENOUS

## 2019-02-09 MED ORDER — MIDAZOLAM HCL 2 MG/2ML IJ SOLN
INTRAMUSCULAR | Status: AC
  Filled 2019-02-09: qty 2

## 2019-02-09 MED ORDER — LACTATED RINGERS IV SOLN: INTRAVENOUS | Status: DC

## 2019-02-09 MED ORDER — ROCURONIUM BROMIDE 10 MG/ML (PF) SYRINGE
PREFILLED_SYRINGE | INTRAVENOUS | Status: DC | PRN
  Administered 2019-02-09: 20 mg via INTRAVENOUS
  Administered 2019-02-09: 50 mg via INTRAVENOUS
  Administered 2019-02-09: 30 mg via INTRAVENOUS

## 2019-02-09 MED ORDER — ACETAMINOPHEN 650 MG RE SUPP: 650.0000 mg | RECTAL | Status: DC | PRN

## 2019-02-09 MED ORDER — ACETAMINOPHEN 325 MG PO TABS: 650.0000 mg | ORAL_TABLET | ORAL | Status: DC | PRN

## 2019-02-09 MED ORDER — POVIDONE-IODINE 7.5 % EX SOLN
Freq: Once | CUTANEOUS | Status: DC
  Filled 2019-02-09: qty 118

## 2019-02-09 MED ORDER — ONDANSETRON HCL 4 MG/2ML IJ SOLN
INTRAMUSCULAR | Status: DC | PRN
  Administered 2019-02-09: 4 mg via INTRAVENOUS

## 2019-02-09 MED ORDER — PANTOPRAZOLE SODIUM 40 MG IV SOLR: 40.0000 mg | Freq: Every day | INTRAVENOUS | Status: DC

## 2019-02-09 MED ORDER — ZOLPIDEM TARTRATE 5 MG PO TABS: 5.0000 mg | ORAL_TABLET | Freq: Every evening | ORAL | Status: DC | PRN

## 2019-02-09 MED ORDER — SODIUM CHLORIDE 0.9% FLUSH: 3.0000 mL | Freq: Two times a day (BID) | INTRAVENOUS | Status: DC

## 2019-02-09 MED ORDER — EPHEDRINE SULFATE 50 MG/ML IJ SOLN
INTRAMUSCULAR | Status: DC | PRN
  Administered 2019-02-09: 5 mg via INTRAVENOUS

## 2019-02-09 MED ORDER — METHOCARBAMOL 500 MG PO TABS
500.0000 mg | ORAL_TABLET | Freq: Four times a day (QID) | ORAL | Status: DC | PRN
  Administered 2019-02-10: 500 mg via ORAL
  Filled 2019-02-09: qty 1

## 2019-02-09 MED ORDER — LEVOTHYROXINE SODIUM 25 MCG PO TABS
125.0000 ug | ORAL_TABLET | Freq: Every day | ORAL | Status: DC
  Administered 2019-02-10: 125 ug via ORAL
  Filled 2019-02-09: qty 1

## 2019-02-09 MED ORDER — GABAPENTIN 300 MG PO CAPS
300.0000 mg | ORAL_CAPSULE | Freq: Three times a day (TID) | ORAL | Status: DC
  Administered 2019-02-09 – 2019-02-10 (×2): 300 mg via ORAL
  Filled 2019-02-09 (×2): qty 1

## 2019-02-09 MED ORDER — OXYCODONE-ACETAMINOPHEN 5-325 MG PO TABS
ORAL_TABLET | ORAL | Status: AC
  Administered 2019-02-09: 2 via ORAL
  Filled 2019-02-09: qty 2

## 2019-02-09 MED ORDER — SCOPOLAMINE 1 MG/3DAYS TD PT72: 1.0000 | MEDICATED_PATCH | TRANSDERMAL | Status: DC

## 2019-02-09 MED ORDER — VANCOMYCIN HCL IN DEXTROSE 1-5 GM/200ML-% IV SOLN
1000.0000 mg | INTRAVENOUS | Status: AC
  Administered 2019-02-09: 1000 mg via INTRAVENOUS
  Filled 2019-02-09: qty 200

## 2019-02-09 MED ORDER — VANCOMYCIN HCL IN DEXTROSE 1-5 GM/200ML-% IV SOLN
1000.0000 mg | Freq: Once | INTRAVENOUS | Status: AC
  Administered 2019-02-09: 1000 mg via INTRAVENOUS
  Filled 2019-02-09: qty 200

## 2019-02-09 MED ORDER — PANTOPRAZOLE SODIUM 40 MG PO TBEC
40.0000 mg | DELAYED_RELEASE_TABLET | Freq: Every day | ORAL | Status: DC
  Administered 2019-02-09: 40 mg via ORAL
  Filled 2019-02-09: qty 1

## 2019-02-09 MED ORDER — MENTHOL 3 MG MT LOZG
1.0000 | LOZENGE | OROMUCOSAL | Status: DC | PRN
  Filled 2019-02-09: qty 9

## 2019-02-09 MED ORDER — LIDOCAINE 2% (20 MG/ML) 5 ML SYRINGE
INTRAMUSCULAR | Status: DC | PRN
  Administered 2019-02-09: 60 mg via INTRAVENOUS

## 2019-02-09 MED ORDER — 0.9 % SODIUM CHLORIDE (POUR BTL) OPTIME
TOPICAL | Status: DC | PRN
  Administered 2019-02-09: 1000 mL

## 2019-02-09 MED ORDER — SODIUM CHLORIDE 0.9% FLUSH: 3.0000 mL | INTRAVENOUS | Status: DC | PRN

## 2019-02-09 MED ORDER — OXYCODONE-ACETAMINOPHEN 5-325 MG PO TABS
1.0000 | ORAL_TABLET | ORAL | Status: DC | PRN
  Administered 2019-02-10: 1 via ORAL
  Administered 2019-02-10: 2 via ORAL
  Administered 2019-02-10: 1 via ORAL
  Filled 2019-02-09 (×2): qty 1
  Filled 2019-02-09: qty 2

## 2019-02-09 MED ORDER — THROMBIN (RECOMBINANT) 20000 UNITS EX SOLR
CUTANEOUS | Status: AC
  Filled 2019-02-09: qty 20000

## 2019-02-09 MED ORDER — HEMOSTATIC AGENTS (NO CHARGE) OPTIME
TOPICAL | Status: DC | PRN
  Administered 2019-02-09: 1 via TOPICAL

## 2019-02-09 MED ORDER — HYDROXYZINE HCL 50 MG/ML IM SOLN: 50.0000 mg | INTRAMUSCULAR | Status: DC | PRN

## 2019-02-09 MED ORDER — SENNOSIDES-DOCUSATE SODIUM 8.6-50 MG PO TABS: 1.0000 | ORAL_TABLET | Freq: Every evening | ORAL | Status: DC | PRN

## 2019-02-09 MED ORDER — THROMBIN 20000 UNITS EX SOLR
CUTANEOUS | Status: DC | PRN
  Administered 2019-02-09: 20000 [IU] via TOPICAL

## 2019-02-09 MED ORDER — SUGAMMADEX SODIUM 200 MG/2ML IV SOLN
INTRAVENOUS | Status: DC | PRN
  Administered 2019-02-09: 180 mg via INTRAVENOUS

## 2019-02-09 MED ORDER — DEXMEDETOMIDINE HCL 200 MCG/2ML IV SOLN
INTRAVENOUS | Status: DC | PRN
  Administered 2019-02-09 (×2): 4 ug via INTRAVENOUS
  Administered 2019-02-09: 8 ug via INTRAVENOUS
  Administered 2019-02-09: 4 ug via INTRAVENOUS

## 2019-02-09 MED ORDER — BUPIVACAINE HCL (PF) 0.25 % IJ SOLN
INTRAMUSCULAR | Status: AC
  Filled 2019-02-09: qty 30

## 2019-02-09 MED ORDER — DOCUSATE SODIUM 100 MG PO CAPS
100.0000 mg | ORAL_CAPSULE | Freq: Two times a day (BID) | ORAL | Status: DC
  Administered 2019-02-09 – 2019-02-10 (×2): 100 mg via ORAL
  Filled 2019-02-09 (×2): qty 1

## 2019-02-09 MED ORDER — FLEET ENEMA 7-19 GM/118ML RE ENEM: 1.0000 | ENEMA | Freq: Once | RECTAL | Status: DC | PRN

## 2019-02-09 MED ORDER — FENTANYL CITRATE (PF) 250 MCG/5ML IJ SOLN
INTRAMUSCULAR | Status: DC | PRN
  Administered 2019-02-09: 100 ug via INTRAVENOUS

## 2019-02-09 SURGICAL SUPPLY — 85 items
AGENT HMST KT MTR STRL THRMB (HEMOSTASIS) ×1
APL SKNCLS STERI-STRIP NONHPOA (GAUZE/BANDAGES/DRESSINGS) ×1
BENZOIN TINCTURE PRP APPL 2/3 (GAUZE/BANDAGES/DRESSINGS) ×2 IMPLANT
BIT DRILL NEURO 2X3.1 SFT TUCH (MISCELLANEOUS) ×1 IMPLANT
BIT DRILL SRG 14X2.2XFLT CHK (BIT) IMPLANT
BIT DRL SRG 14X2.2XFLT CHK (BIT) ×1
BLADE CLIPPER SURG (BLADE) ×2 IMPLANT
BLADE SURG 15 STRL LF DISP TIS (BLADE) ×1 IMPLANT
BLADE SURG 15 STRL SS (BLADE) ×2
BONE VIVIGEN FORMABLE 1.3CC (Bone Implant) ×4 IMPLANT
BUR MATCHSTICK NEURO 3.0 LAGG (BURR) IMPLANT
CARTRIDGE OIL MAESTRO DRILL (MISCELLANEOUS) ×1 IMPLANT
COLLAR CERV LO CONTOUR FIRM DE (SOFTGOODS) IMPLANT
CORD BIPOLAR FORCEPS 12FT (ELECTRODE) ×2 IMPLANT
COVER MAYO STAND STRL (DRAPES) ×1 IMPLANT
COVER SURGICAL LIGHT HANDLE (MISCELLANEOUS) ×2 IMPLANT
COVER WAND RF STERILE (DRAPES) ×2 IMPLANT
DECANTER SPIKE VIAL GLASS SM (MISCELLANEOUS) ×1 IMPLANT
DIFFUSER DRILL AIR PNEUMATIC (MISCELLANEOUS) ×2 IMPLANT
DRAIN JACKSON RD 7FR 3/32 (WOUND CARE) IMPLANT
DRAPE C-ARM 42X72 X-RAY (DRAPES) ×2 IMPLANT
DRAPE POUCH INSTRU U-SHP 10X18 (DRAPES) ×2 IMPLANT
DRAPE SURG 17X23 STRL (DRAPES) ×8 IMPLANT
DRILL BIT SKYLINE 14MM (BIT) ×2
DRILL NEURO 2X3.1 SOFT TOUCH (MISCELLANEOUS) ×2
DURAPREP 26ML APPLICATOR (WOUND CARE) ×2 IMPLANT
ELECT COATED BLADE 2.86 ST (ELECTRODE) ×2 IMPLANT
ELECT REM PT RETURN 9FT ADLT (ELECTROSURGICAL) ×2
ELECTRODE REM PT RTRN 9FT ADLT (ELECTROSURGICAL) ×1 IMPLANT
EVACUATOR SILICONE 100CC (DRAIN) IMPLANT
GAUZE 4X4 16PLY RFD (DISPOSABLE) ×2 IMPLANT
GAUZE SPONGE 4X4 12PLY STRL (GAUZE/BANDAGES/DRESSINGS) ×2 IMPLANT
GAUZE SPONGE 4X4 12PLY STRL LF (GAUZE/BANDAGES/DRESSINGS) ×1 IMPLANT
GLOVE BIO SURGEON STRL SZ7 (GLOVE) ×1 IMPLANT
GLOVE BIO SURGEON STRL SZ8 (GLOVE) ×1 IMPLANT
GLOVE BIOGEL PI IND STRL 7.0 (GLOVE) ×2 IMPLANT
GLOVE BIOGEL PI IND STRL 8 (GLOVE) ×1 IMPLANT
GLOVE BIOGEL PI INDICATOR 7.0 (GLOVE) ×2
GLOVE BIOGEL PI INDICATOR 8 (GLOVE) ×2
GLOVE SURG SS PI 7.0 STRL IVOR (GLOVE) ×1 IMPLANT
GLOVE SURG SS PI 8.0 STRL IVOR (GLOVE) ×1 IMPLANT
GOWN STRL REUS W/ TWL LRG LVL3 (GOWN DISPOSABLE) ×1 IMPLANT
GOWN STRL REUS W/ TWL XL LVL3 (GOWN DISPOSABLE) ×1 IMPLANT
GOWN STRL REUS W/TWL LRG LVL3 (GOWN DISPOSABLE) ×2
GOWN STRL REUS W/TWL XL LVL3 (GOWN DISPOSABLE) ×2
GRAFT BNE MATRIX VG FRMBL SM 1 (Bone Implant) IMPLANT
INTERLOCK LRDTC CRVCL VBR 7MM (Bone Implant) IMPLANT
INTERLOCK LRDTC CRVCL VBR 8MM (Peek) IMPLANT
IV CATH 14GX2 1/4 (CATHETERS) ×2 IMPLANT
KIT BASIN OR (CUSTOM PROCEDURE TRAY) ×2 IMPLANT
KIT TURNOVER KIT B (KITS) ×2 IMPLANT
LORDOTIC CERVICAL VBR 7MM SM (Bone Implant) ×2 IMPLANT
LORDOTIC CERVICAL VBR 8MM SM (Peek) ×2 IMPLANT
MANIFOLD NEPTUNE II (INSTRUMENTS) ×2 IMPLANT
NDL PRECISIONGLIDE 27X1.5 (NEEDLE) ×1 IMPLANT
NDL SPNL 20GX3.5 QUINCKE YW (NEEDLE) ×1 IMPLANT
NEEDLE PRECISIONGLIDE 27X1.5 (NEEDLE) ×2 IMPLANT
NEEDLE SPNL 20GX3.5 QUINCKE YW (NEEDLE) ×2 IMPLANT
NS IRRIG 1000ML POUR BTL (IV SOLUTION) ×2 IMPLANT
OIL CARTRIDGE MAESTRO DRILL (MISCELLANEOUS) ×2
PACK ORTHO CERVICAL (CUSTOM PROCEDURE TRAY) ×2 IMPLANT
PAD ARMBOARD 7.5X6 YLW CONV (MISCELLANEOUS) ×4 IMPLANT
PATTIES SURGICAL .5 X.5 (GAUZE/BANDAGES/DRESSINGS) IMPLANT
PATTIES SURGICAL .5 X1 (DISPOSABLE) ×2 IMPLANT
PIN DISTRACTION 14 (PIN) ×2 IMPLANT
PLATE TWO LEVEL SKYLINE 30MM (Plate) ×1 IMPLANT
POSITIONER HEAD DONUT 9IN (MISCELLANEOUS) ×2 IMPLANT
SCREW SKYLINE VAR OS 14MM (Screw) ×6 IMPLANT
SPONGE INTESTINAL PEANUT (DISPOSABLE) ×2 IMPLANT
SPONGE SURGIFOAM ABS GEL 100 (HEMOSTASIS) ×2 IMPLANT
STRIP CLOSURE SKIN 1/2X4 (GAUZE/BANDAGES/DRESSINGS) ×2 IMPLANT
SURGIFLO W/THROMBIN 8M KIT (HEMOSTASIS) ×1 IMPLANT
SUT MNCRL AB 4-0 PS2 18 (SUTURE) ×2 IMPLANT
SUT SILK 4 0 (SUTURE)
SUT SILK 4-0 18XBRD TIE 12 (SUTURE) IMPLANT
SUT VIC AB 2-0 CT2 18 VCP726D (SUTURE) ×2 IMPLANT
SYR BULB IRRIGATION 50ML (SYRINGE) ×2 IMPLANT
SYR CONTROL 10ML LL (SYRINGE) ×6 IMPLANT
TAPE CLOTH 4X10 WHT NS (GAUZE/BANDAGES/DRESSINGS) ×2 IMPLANT
TAPE CLOTH SURG 4X10 WHT LF (GAUZE/BANDAGES/DRESSINGS) ×1 IMPLANT
TAPE UMBILICAL COTTON 1/8X30 (MISCELLANEOUS) ×2 IMPLANT
TOWEL GREEN STERILE (TOWEL DISPOSABLE) ×2 IMPLANT
TOWEL GREEN STERILE FF (TOWEL DISPOSABLE) ×2 IMPLANT
WATER STERILE IRR 1000ML POUR (IV SOLUTION) ×2 IMPLANT
YANKAUER SUCT BULB TIP NO VENT (SUCTIONS) ×2 IMPLANT

## 2019-02-09 NOTE — Op Note (Signed)
PATIENT NAME: Deborah Callahan   MEDICAL RECORD NO.:   [id number]    DATE OF BIRTH: 1960/07/07  DATE OF PROCEDURE: 02/09/2019                               OPERATIVE REPORT     PREOPERATIVE DIAGNOSES: 1.  Cervical myelopathy 2.  Spinal cord compression, C4-5 greater than C3-4 at the levels above the patient's previous fusion   POSTOPERATIVE DIAGNOSES: 1.  Cervical myelopathy 2.  Spinal cord compression, C4-5 greater than C3-4 at the levels above the patient's previous fusion   PROCEDURE: 1. Anterior cervical decompression and fusion C3/4, C4/5 2. Placement of anterior instrumentation, C3-C5. 3. Insertion of interbody device x 2 (Titan intervertebral spacers). 4. Intraoperative use of fluoroscopy. 5. Use of morselized allograft - ViviGen.   SURGEON:  Phylliss Bob, MD   ASSISTANT:  Pricilla Holm, PA-C.   ANESTHESIA:  General endotracheal anesthesia.   COMPLICATIONS:  None.   DISPOSITION:  Stable.   ESTIMATED BLOOD LOSS:  Minimal.   INDICATIONS FOR SURGERY:  Briefly, Ms. Rames is a pleasant 58 y.o. year- old female, who I did evaluate due to a very large disc herniation in her cervical spine. This was picked up on an MRI by a partner of mine, Dr. Grandville Silos.  I did review the MRI, and did evaluate the patient later the same day, when she was noted to have pain in her neck as well as pain into her bilateral arms.  I did review with her the findings of her MRI.  As she did understand, she was noted to have very severe spinal cord compression at C4-5, with compression also noted at C3-4.  Given her symptoms and the very prominent MRI findings, I did discuss proceeding with the surgery reflected above.  Of note, the patient did report substantial swallowing difficulties prior to her previous ACDF procedure.  I did express to the patient that, given her history of dysphagia from her previous surgery, she can likely expect similar symptoms following the surgery.  She did  clearly understand the risks of surgery, and she did understand that surgery would not guarantee resolution of her current constellation of symptoms, but would lessen the risk of a progressive neurologic deficit, which would be expected over time, without surgical intervention.  For these reasons, we did get her set up with surgery at her earliest convenience, and she did wish to proceed.   OPERATIVE DETAILS:  On 02/09/2019  the patient was brought to surgery and general endotracheal anesthesia was administered.  The patient was placed supine on the hospital bed. The neck was gently extended.  All bony prominences were meticulously padded.  The neck was prepped and draped in the usual sterile fashion.  At this point, I did make a right-sided transverse incision.  The platysma was incised.  A Smith-Robinson approach was used and the anterior spine was identified. A self-retaining retractor was placed.  At this point, I identified the previously placed anterior cervical plate, spanning C5-C7.  I did uneventfully remove the screws from the plate, and the plate was removed uneventfully.  I was very meticulous in ensuring gentle mobilization of the esophagus, given the patient's previous dysphagia after her previous surgery. At this point, I subperiosteally exposed the vertebral bodies from C3-C5. The previously noted anterior cervical plate was noted, and the uppermost screws were removed. Caspar pins were then placed into the C4 and  C5 vertebral bodies and distraction was applied.  A thorough and complete C4-5 intervertebral diskectomy was performed. Posteriorly, there was abundant disc material identified, clearly severely compressing the spinal cord. The herniated disc material was removed in a piecemeal and meticulous fashion.  The entirety of the herniated disc material was removed, thereby decompressing the spinal cord and spinal canal. The endplates were then prepared and the appropriate-sized  intervertebral spacer was then packed with ViviGen and tamped into position in the usual fashion. The lower Caspar pin was then removed and placed into the C3 vertebral body and once again, distraction was applied across the C3-4 intervertebral space. I then again performed a thorough and complete diskectomy, thoroughly decompressing the spinal canal and bilateral neuroforamena. After preparing the endplates, the appropriate-sized intervertebral spacer was packed with ViviGen and tamped into position. The Caspar pins then were removed and bone wax was placed in their place. The appropriate-sized anterior cervical plate was placed over the anterior spine. 14 mm variable angle screws were placed, 2 in each vertebral body from C3-C5 for a total of 6 vertebral body screws. The screws were then locked to the plate using the Cam locking mechanism. I was very pleased with the final fluoroscopic images. The wound was then irrigated. The wound was then explored for any undue bleeding and there was no bleeding noted. The wound was then closed in layers using 2-0 Vicryl, followed by 4-0 Monocryl.  Benzoin and Steri-Strips were applied, followed by sterile dressing.  All instrument counts were correct at the termination of the procedure.   Of note, Pricilla Holm, PA-C, was my assistant throughout surgery, and did aid in retraction, suctioning, placement of the hardware, and closure.     Phylliss Bob, MD

## 2019-02-09 NOTE — Anesthesia Preprocedure Evaluation (Signed)
Anesthesia Evaluation  Patient identified by MRN, date of birth, ID band Patient awake    Reviewed: Allergy & Precautions, NPO status , Patient's Chart, lab work & pertinent test results  History of Anesthesia Complications (+) PONV and history of anesthetic complications  Airway Mallampati: I  TM Distance: >3 FB Neck ROM: Limited    Dental no notable dental hx.    Pulmonary asthma ,    Pulmonary exam normal breath sounds clear to auscultation       Cardiovascular negative cardio ROS Normal cardiovascular exam Rhythm:Regular Rate:Normal     Neuro/Psych  Headaches,  Neuromuscular disease negative psych ROS   GI/Hepatic Neg liver ROS, hiatal hernia, GERD  Controlled,IBS (irritable bowel syndrome)   Endo/Other  Hypothyroidism   Renal/GU negative Renal ROS     Musculoskeletal negative musculoskeletal ROS (+)   Abdominal   Peds  Hematology negative hematology ROS (+)   Anesthesia Other Findings CERVICAL MYELOPATHY, LARGE DISC HERNIATION AT CERVICAL 4-5, SEVERE SPINAL CORD COMPRESSION  Reproductive/Obstetrics                             Anesthesia Physical Anesthesia Plan  ASA: II  Anesthesia Plan: General   Post-op Pain Management:    Induction: Intravenous  PONV Risk Score and Plan: 4 or greater and Scopolamine patch - Pre-op, Midazolam, Dexamethasone and Ondansetron  Airway Management Planned: Oral ETT and Video Laryngoscope Planned  Additional Equipment:   Intra-op Plan:   Post-operative Plan: Extubation in OR  Informed Consent: I have reviewed the patients History and Physical, chart, labs and discussed the procedure including the risks, benefits and alternatives for the proposed anesthesia with the patient or authorized representative who has indicated his/her understanding and acceptance.     Dental advisory given  Plan Discussed with: CRNA  Anesthesia Plan Comments:  (Patient took 1300 mg of acetaminophen at 0700 on 02/09/19 Dexmetetomidine )        Anesthesia Quick Evaluation

## 2019-02-09 NOTE — H&P (Signed)
PREOPERATIVE H&P  Chief Complaint: Neck pain, bilateral arm pain  HPI: Deborah Callahan is a 58 y.o. female who presents with ongoing pain in the neck and bilateral arms.  The patient has also had numbness into both of her hands.  MRI reveals severe spinal cord compression at C4-C5, with compression also noted at C3-C4, the levels above her previous ACDF procedures.  Patient has failed multiple forms of conservative care and continues to have pain (see office notes for additional details regarding the patient's full course of treatment)  Past Medical History:  Diagnosis Date  . Arthritis    degenerative cerv. spine , beginning signs of carpal tunnel syndrome- L hand    . Complication of anesthesia   . Cystocele   . Environmental and seasonal allergies   . Family history of anesthesia complication    N&V- mother & sister  . GERD (gastroesophageal reflux disease)    DIET CONTROLLED  . Headache(784.0)    migraines   . History of hiatal hernia   . History of kidney stones   . Hypothyroidism   . IBS (irritable bowel syndrome)   . IC (interstitial cystitis)   . Mild intermittent asthma   . Pelvic relaxation   . Pneumonia   . PONV (postoperative nausea and vomiting)   . Wears glasses    Past Surgical History:  Procedure Laterality Date  . ABDOMINAL HYSTERECTOMY     partial hysterectomy  . ANTERIOR AND POSTERIOR REPAIR  09/23/2014   Procedure: ANTERIOR (CYSTOCELE) AND POSTERIOR REPAIR (RECTOCELE);  Surgeon: Arvella Nigh, MD;  Location: [facility];  Service: Gynecology;;  . ANTERIOR CERVICAL DECOMP/DISCECTOMY FUSION N/A 05/08/2012   Procedure: ACDF C5-7  ANTERIOR CERVICAL DECOMPRESSION/DISCECTOMY FUSION 2 LEVELS;  Surgeon: Melina Schools, MD;  Location: [place];  Service: Orthopedics;  Laterality: N/A;  . COLONOSCOPY  10-19-2005  . D & C HYSTEROSCOPE/ RESECTION POLYP/  POSTERIOR AND ENTEROOCELE REPAIRS/  CARDINAL UTEROSACRAL COLPOSUSPENSION /  PERINEAL  BODY REPAIR  04-22-2003  . FRACTURE SURGERY Right 2005   Right shoulder with Dr. Theda Sers at [place]  . LAPAROSCOPIC ASSISTED VAGINAL HYSTERECTOMY  11-02-2004   w/  Release posterior vaginal scar  . LAPAROSCOPIC CHOLECYSTECTOMY  10-11-2001  . TUBAL LIGATION  1988   Social History   Socioeconomic History  . Marital status: Married    Spouse name: Harrie Jeans  . Number of children: 2  . Years of education: 25  . Highest education level: Not on file  Occupational History    Comment: painter/artist  Tobacco Use  . Smoking status: Never Smoker  . Smokeless tobacco: Never Used  Substance and Sexual Activity  . Alcohol use: Yes    Comment: OCCASIONAL WINE  . Drug use: No  . Sexual activity: Not on file  Other Topics Concern  . Not on file  Social History Narrative   Lives at home with husband   Caffeine - chocolate   Social Determinants of Health   Financial Resource Strain:   . Difficulty of Paying Living Expenses: Not on file  Food Insecurity:   . Worried About Charity fundraiser in the Last Year: Not on file  . Ran Out of Food in the Last Year: Not on file  Transportation Needs:   . Lack of Transportation (Medical): Not on file  . Lack of Transportation (Non-Medical): Not on file  Physical Activity:   . Days of Exercise per Week: Not on file  . Minutes of  Exercise per Session: Not on file  Stress:   . Feeling of Stress : Not on file  Social Connections:   . Frequency of Communication with Friends and Family: Not on file  . Frequency of Social Gatherings with Friends and Family: Not on file  . Attends Religious Services: Not on file  . Active Member of Clubs or Organizations: Not on file  . Attends Archivist Meetings: Not on file  . Marital Status: Not on file   Family History  Problem Relation Age of Onset  . Cancer Mother        breast  . Arthritis/Rheumatoid Mother   . Cancer Sister        breast  . Cancer Maternal Grandmother         breast   Allergies  Allergen Reactions  . Codeine Anaphylaxis, Hives and Itching  . Wheat Bran Anaphylaxis, Hives, Diarrhea and Swelling    AND  GLUTEN CONTAINING PRODUCTS  . Eggs Or Egg-Derived Products Hives, Diarrhea and Nausea And Vomiting    Only egg yolk  . Lactose Intolerance (Gi) Hives, Diarrhea and Nausea And Vomiting    All milk products  (rice milk is ok)  . Latex Itching and Other (See Comments)    Skin irritation/ reddness  . Lentil Diarrhea and Swelling    Bowel irritation   . Peanuts [Peanut Oil] Hives, Diarrhea and Nausea And Vomiting    THIS INCLUDES PEANUTS AND ALMONDS ( TREE NUTS OK)  . Soybean-Containing Drug Products Hives, Nausea And Vomiting and Swelling  . Tramadol Hives, Nausea And Vomiting and Swelling  . Corn-Containing Products Diarrhea   Prior to Admission medications   Medication Sig Start Date End Date Taking? Authorizing Provider  acetaminophen (TYLENOL 8 HOUR) 650 MG CR tablet Take 1,300 mg by mouth every 8 (eight) hours as needed for pain.    Yes [provider]  Ascorbic Acid (VITAMIN C PO) Take 1 tablet by mouth daily.   Yes [provider]  b complex vitamins tablet Take 1 tablet by mouth daily.   Yes [provider]  CALCIUM PO Take 1 tablet by mouth daily.   Yes [provider]  Cholecalciferol (VITAMIN D3 PO) Take 1 capsule by mouth daily.   Yes [provider]  gabapentin (NEURONTIN) 300 MG capsule Take 300 mg by mouth 3 (three) times daily.   Yes [provider]  Homeopathic Products [facility] ALLERGY RELIEF) GEL Place 1 application into the nose at bedtime.   Yes [provider]  levothyroxine (SYNTHROID) 125 MCG tablet Take 125 mcg by mouth daily before breakfast.   Yes [provider]  MELATONIN PO Take 1 tablet by mouth at bedtime as needed (sleep).   Yes [provider]  Menaquinone-7 (VITAMIN K2 PO) Take 1 capsule by mouth daily.   Yes [provider]    Multiple Vitamin (MULTIVITAMIN) capsule Take 1 capsule by mouth daily.   Yes [provider]  VITAMIN E PO Take 1 capsule by mouth daily.   Yes [provider]     All other systems have been reviewed and were otherwise negative with the exception of those mentioned in the HPI and as above.  Physical Exam: Vitals:   02/09/19 1106  BP: (!) 161/73  Pulse: 78  Resp: 20  Temp: 97.9 F (36.6 C)  SpO2: 100%    Body mass index is 23.41 kg/m.  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

## 2019-02-09 NOTE — Progress Notes (Signed)
Pharmacy Antibiotic Note  Deborah Callahan Deborah Callahan is a 58 y.o. female admitted on 02/09/2019 with severe spinal cord compression at C4-C5, with compression also noted at C3-C4.  Pt to OR today for cervical decompression and fusion. Pharmacy has been consulted for vancomycin dosing for post-op surgical prophylaxis.  Pt rec'd vancomycin 1 gm IV X 1 at 11:25 AM today; per RN, pt has no drains in place post-op.  WBC 4.1, afebrile, Scr 0.69, CrCl ~71 ml/min (renal function stable)  Plan: Vancomycin 1 gm  IV X 1 to be given 12 hrs after pre-op dose (dose scheduled for 23:30 PM this evening)  Height: 5\' 6"  (167.6 cm) Weight: 145 lb 1 oz (65.8 kg) IBW/kg (Calculated) : 59.3  Temp (24hrs), Avg:98.6 F (37 C), Min:97.9 F (36.6 C), Max:99.6 F (37.6 C)  Recent Labs  Lab 02/06/19 0904  WBC 4.1  CREATININE 0.69    Estimated Creatinine Clearance: 70.9 mL/min (by C-G formula based on SCr of 0.69 mg/dL).    Allergies  Allergen Reactions  . Codeine Anaphylaxis, Hives and Itching  . Wheat Bran Anaphylaxis, Hives, Diarrhea and Swelling    AND  GLUTEN CONTAINING PRODUCTS  . Eggs Or Egg-Derived Products Hives, Diarrhea and Nausea And Vomiting    Only egg yolk  . Lactose Intolerance (Gi) Hives, Diarrhea and Nausea And Vomiting    All milk products  (rice milk is ok)  . Latex Itching and Other (See Comments)    Skin irritation/ reddness  . Lentil Diarrhea and Swelling    Bowel irritation   . Peanuts [Peanut Oil] Hives, Diarrhea and Nausea And Vomiting    THIS INCLUDES PEANUTS AND ALMONDS ( TREE NUTS OK)  . Soybean-Containing Drug Products Hives, Nausea And Vomiting and Swelling  . Tramadol Hives, Nausea And Vomiting and Swelling  . Corn-Containing Products Diarrhea    Microbiology results: 12/18 MRSA PCR: negative 12/18 COVID: negative Thank you for allowing pharmacy to be a part of this patient's care.  Gillermina Hu, PharmD, BCPS, [facility] Clinical Pharmacist 02/09/2019 7:21 PM

## 2019-02-09 NOTE — Anesthesia Procedure Notes (Signed)
Procedure Name: Intubation Date/Time: 02/09/2019 1:48 PM Performed by: Bryson Corona, CRNA Pre-anesthesia Checklist: Patient identified, Emergency Drugs available, Suction available and Patient being monitored Patient Re-evaluated:Patient Re-evaluated prior to induction Oxygen Delivery Method: Circle System Utilized Preoxygenation: Pre-oxygenation with 100% oxygen Induction Type: IV induction Ventilation: Mask ventilation without difficulty Laryngoscope Size: Glidescope and 3 Grade View: Grade I Tube type: Oral Number of attempts: 1 Airway Equipment and Method: Stylet and Oral airway Placement Confirmation: ETT inserted through vocal cords under direct vision,  positive ETCO2 and breath sounds checked- equal and bilateral Secured at: 21 cm Tube secured with: Tape Dental Injury: Teeth and Oropharynx as per pre-operative assessment  Difficulty Due To: Difficult Airway- due to reduced neck mobility Comments: Prior ACDF surgery

## 2019-02-10 ENCOUNTER — Encounter: Payer: Self-pay | Admitting: *Deleted

## 2019-02-10 MED ORDER — OXYCODONE-ACETAMINOPHEN 5-325 MG PO TABS: 1.0000 | ORAL_TABLET | ORAL | 0 refills | Status: DC | PRN

## 2019-02-10 MED ORDER — METHOCARBAMOL 500 MG PO TABS: 500.0000 mg | ORAL_TABLET | Freq: Four times a day (QID) | ORAL | 2 refills | Status: DC | PRN

## 2019-02-10 NOTE — Transfer of Care (Signed)
Immediate Anesthesia Transfer of Care Note  Patient: Deborah Callahan  Procedure(s) Performed: ANTERIOR CERVICAL DECOMPRESSION FUSION CERVICAL 3-4, CERVICAL 4-5 WITH INSTRUMENTATION AND ALLOGRAFT (N/A )  Patient Location: PACU  Anesthesia Type:General  Level of Consciousness: drowsy  Airway & Oxygen Therapy: Patient Spontanous Breathing and Patient connected to face mask oxygen  Post-op Assessment: Report given to RN, Post -op Vital signs reviewed and stable and Patient moving all extremities X 4  Post vital signs: Reviewed and stable  Last Vitals:  Vitals Value Taken Time  BP 114/66 02/10/19 0740  Temp 36.7 C 02/10/19 0740  Pulse 71 02/10/19 0740  Resp 19 02/10/19 0740  SpO2 100 % 02/10/19 0740    Last Pain:  Vitals:   02/10/19 0605  TempSrc:   PainSc: 5       Patients Stated Pain Goal: 3 ([id number])  Complications: No apparent anesthesia complications

## 2019-02-10 NOTE — Plan of Care (Signed)
Patient alert and oriented, mae's well, voiding adequate amount of urine, swallowing without difficulty, no c/o pain at time of discharge. Patient discharged home with family. Script and discharged instructions given to patient. Patient and family stated understanding of instructions given. Patient has an appointment with Dr. Dumonski 

## 2019-02-10 NOTE — Progress Notes (Signed)
    Patient doing well  Denies arm pain Tolerating PO well Has been eating and drinking well   Physical Exam: Vitals:   02/09/19 2301 02/10/19 0350  BP: (!) 163/75 (!) 143/70  Pulse: 90 81  Resp: 18 18  Temp: 98.9 F (37.2 C) 98.9 F (37.2 C)  SpO2: 98% 98%    Neck soft/supple Dressing in place NVI  POD #1 s/p ACDF for severe spinal cord compression, doing well  - encourage ambulation - Percocet for pain, Valium for muscle spasms - d/c home today with f/u in 2 weeks

## 2019-02-11 NOTE — Anesthesia Postprocedure Evaluation (Signed)
Anesthesia Post Note  Patient: Deborah Callahan  Procedure(s) Performed: ANTERIOR CERVICAL DECOMPRESSION FUSION CERVICAL 3-4, CERVICAL 4-5 WITH INSTRUMENTATION AND ALLOGRAFT (N/A )     Patient location during evaluation: PACU Anesthesia Type: General Level of consciousness: awake and alert Pain management: pain level controlled Vital Signs Assessment: post-procedure vital signs reviewed and stable Respiratory status: spontaneous breathing, nonlabored ventilation, respiratory function stable and patient connected to nasal cannula oxygen Cardiovascular status: blood pressure returned to baseline and stable Postop Assessment: no apparent nausea or vomiting Anesthetic complications: no    Last Vitals:  Vitals:   02/10/19 0350 02/10/19 0740  BP: (!) 143/70 114/66  Pulse: 81 71  Resp: 18 19  Temp: 37.2 C 36.7 C  SpO2: 98% 100%    Last Pain:  Vitals:   02/10/19 0704  TempSrc:   PainSc: 3                  Masami Plata P Kendra Woolford

## 2019-12-17 ENCOUNTER — Other Ambulatory Visit: Payer: Self-pay | Admitting: Physician Assistant

## 2019-12-17 DIAGNOSIS — E0789 Other specified disorders of thyroid: Secondary | ICD-10-CM

## 2019-12-28 ENCOUNTER — Ambulatory Visit
Admission: RE | Admit: 2019-12-28 | Discharge: 2019-12-28 | Disposition: A | Discharge status: Home / Self Care | Payer: No Typology Code available for payment source | Source: Ambulatory Visit | Attending: Physician Assistant | Admitting: Physician Assistant

## 2019-12-28 DIAGNOSIS — E0789 Other specified disorders of thyroid: Secondary | ICD-10-CM

## 2020-02-08 ENCOUNTER — Other Ambulatory Visit: Payer: Self-pay

## 2020-02-08 ENCOUNTER — Emergency Department (HOSPITAL_COMMUNITY): Payer: Self-pay

## 2020-02-08 ENCOUNTER — Emergency Department (HOSPITAL_COMMUNITY)
Admission: EM | Admit: 2020-02-08 | Discharge: 2020-02-08 | Disposition: A | Discharge status: Home / Self Care | Payer: Self-pay | Attending: Emergency Medicine | Admitting: Emergency Medicine

## 2020-02-08 DIAGNOSIS — R079 Chest pain, unspecified: Secondary | ICD-10-CM | POA: Insufficient documentation

## 2020-02-08 DIAGNOSIS — Z5321 Procedure and treatment not carried out due to patient leaving prior to being seen by health care provider: Secondary | ICD-10-CM | POA: Insufficient documentation

## 2020-02-08 LAB — CBC
HCT: 42.2 % (ref 36.0–46.0)
Hemoglobin: 14.4 g/dL (ref 12.0–15.0)
MCH: 32 pg (ref 26.0–34.0)
MCHC: 34.1 g/dL (ref 30.0–36.0)
MCV: 93.8 fL (ref 80.0–100.0)
Platelets: 232 10*3/uL (ref 150–400)
RBC: 4.5 MIL/uL (ref 3.87–5.11)
RDW: 12.3 % (ref 11.5–15.5)
WBC: 4.4 10*3/uL (ref 4.0–10.5)
nRBC: 0 % (ref 0.0–0.2)

## 2020-02-08 LAB — BASIC METABOLIC PANEL
Anion gap: 13 (ref 5–15)
BUN: 9 mg/dL (ref 6–20)
CO2: 21 mmol/L — ABNORMAL LOW (ref 22–32)
Calcium: 9.5 mg/dL (ref 8.9–10.3)
Chloride: 103 mmol/L (ref 98–111)
Creatinine, Ser: 0.58 mg/dL (ref 0.44–1.00)
GFR, Estimated: >60 mL/min (ref >60)
Glucose, Bld: 105 mg/dL — ABNORMAL HIGH (ref 70–99)
Potassium: 3.6 mmol/L (ref 3.5–5.1)
Sodium: 137 mmol/L (ref 135–145)

## 2020-02-08 LAB — I-STAT BETA HCG BLOOD, ED (MC, WL, AP ONLY): I-stat hCG, quantitative: <5 m[IU]/mL (ref <5)

## 2020-02-08 LAB — TROPONIN I (HIGH SENSITIVITY): Troponin I (High Sensitivity): 3 ng/L (ref <18)

## 2020-02-08 NOTE — ED Triage Notes (Signed)
Arrived via EMS; c/o chest pain onset over the weekend while working out. Another episode today and given ASA 324 mg along w/ NTG 0.4 SL x 3 altogether and have significant relief.

## 2020-02-08 NOTE — ED Notes (Signed)
Pt not answering for vital recheck

## 2020-02-08 NOTE — ED Notes (Signed)
Called patient unable to locate at this time.

## 2020-02-08 NOTE — ED Notes (Signed)
Called patient to update V/S,  Unable to locate at this time.

## 2020-03-22 ENCOUNTER — Ambulatory Visit: Payer: Self-pay | Admitting: Cardiovascular Disease

## 2020-05-23 DIAGNOSIS — R1310 Dysphagia, unspecified: Secondary | ICD-10-CM | POA: Insufficient documentation

## 2020-05-23 DIAGNOSIS — R1314 Dysphagia, pharyngoesophageal phase: Secondary | ICD-10-CM | POA: Insufficient documentation

## 2020-05-23 DIAGNOSIS — R490 Dysphonia: Secondary | ICD-10-CM | POA: Insufficient documentation

## 2020-06-07 ENCOUNTER — Encounter: Payer: Self-pay | Admitting: Physician Assistant

## 2020-06-07 ENCOUNTER — Ambulatory Visit (INDEPENDENT_AMBULATORY_CARE_PROVIDER_SITE_OTHER): Payer: Self-pay | Admitting: Physician Assistant

## 2020-06-07 ENCOUNTER — Other Ambulatory Visit: Payer: Self-pay

## 2020-06-07 VITALS — BP 122/72 | HR 74 | Temp 95.6°F | Wt 149.8 lb

## 2020-06-07 DIAGNOSIS — K5900 Constipation, unspecified: Secondary | ICD-10-CM

## 2020-06-07 DIAGNOSIS — F331 Major depressive disorder, recurrent, moderate: Secondary | ICD-10-CM | POA: Insufficient documentation

## 2020-06-07 DIAGNOSIS — M461 Sacroiliitis, not elsewhere classified: Secondary | ICD-10-CM | POA: Insufficient documentation

## 2020-06-07 DIAGNOSIS — E039 Hypothyroidism, unspecified: Secondary | ICD-10-CM

## 2020-06-07 DIAGNOSIS — F419 Anxiety disorder, unspecified: Secondary | ICD-10-CM | POA: Insufficient documentation

## 2020-06-07 DIAGNOSIS — N200 Calculus of kidney: Secondary | ICD-10-CM | POA: Insufficient documentation

## 2020-06-07 DIAGNOSIS — F411 Generalized anxiety disorder: Secondary | ICD-10-CM | POA: Insufficient documentation

## 2020-06-07 DIAGNOSIS — D72819 Decreased white blood cell count, unspecified: Secondary | ICD-10-CM | POA: Insufficient documentation

## 2020-06-07 DIAGNOSIS — G5603 Carpal tunnel syndrome, bilateral upper limbs: Secondary | ICD-10-CM | POA: Insufficient documentation

## 2020-06-07 DIAGNOSIS — M25519 Pain in unspecified shoulder: Secondary | ICD-10-CM | POA: Insufficient documentation

## 2020-06-07 DIAGNOSIS — G5620 Lesion of ulnar nerve, unspecified upper limb: Secondary | ICD-10-CM | POA: Insufficient documentation

## 2020-06-07 DIAGNOSIS — K625 Hemorrhage of anus and rectum: Secondary | ICD-10-CM

## 2020-06-07 MED ORDER — LEVOTHYROXINE SODIUM 112 MCG PO TABS: 112.0000 ug | ORAL_TABLET | Freq: Every day | ORAL | 1 refills | Status: DC

## 2020-06-07 MED ORDER — LEVOTHYROXINE SODIUM 137 MCG PO TABS: 137.0000 ug | ORAL_TABLET | Freq: Every day | ORAL | 1 refills | Status: DC

## 2020-06-07 NOTE — Progress Notes (Signed)
Deborah Callahan is a 60 y.o. female here to establish care and follow-up on chronic medical issues.  History of Present Illness:   Chief Complaint  Patient presents with  . Establish Care  . Thyroid Problem    Would like to discuss.    HPI   Constipation Has been dealing with this since her gallbladder has been removed. Eats: red cabbage, does a lot of juicing and will taking this every other week. If doing the juicing has "slude-like stools" otherwise has hard pellets. Has some allergens for soy, gluten per patient's report. Has had dysphagia due to scar tissue from prior discectomies. Has confirmed GERD and hiatal hernia from EGD. Has seen Dr. Cristina Gong in the past, prior to [facility]. Drinks plenty of water. Denies unintentional weight loss.  Has had some rectal bleeding in February and March. She is overdue for colonoscopy, states that this was done in 10 years ago.   Hypothyroidism Diagnosed over 10 years ago. She is compliant with this medication, takes first thing in the morning on an empty stomach. TSH was recently checked, was 0.19 on 05/06/20, dose was not adjusted, was sent to endocrinology but still hasn't been. She felt best on levothyroxine 150 mcg daily, but is currently prescribed levothyroxine 125 mcg daily.      Past Medical History:  Diagnosis Date  . Arthritis    degenerative cerv. spine , beginning signs of carpal tunnel syndrome- L hand    . Complication of anesthesia   . Cystocele   . Environmental and seasonal allergies   . Family history of anesthesia complication    N&V- mother & sister  . GERD (gastroesophageal reflux disease)    DIET CONTROLLED  . Headache(784.0)    migraines   . History of hiatal hernia   . History of kidney stones   . Hypothyroidism   . IBS (irritable bowel syndrome)   . IC (interstitial cystitis)   . Mild intermittent asthma   . Pelvic relaxation   . Pneumonia   . PONV (postoperative nausea and vomiting)    . Wears glasses      Social History   Tobacco Use  . Smoking status: Never Smoker  . Smokeless tobacco: Never Used  Vaping Use  . Vaping Use: Never used  Substance Use Topics  . Alcohol use: Yes    Comment: OCCASIONAL WINE  . Drug use: No    Past Surgical History:  Procedure Laterality Date  . ABDOMINAL HYSTERECTOMY     partial hysterectomy  . ANTERIOR AND POSTERIOR REPAIR  09/23/2014   Procedure: ANTERIOR (CYSTOCELE) AND POSTERIOR REPAIR (RECTOCELE);  Surgeon: Arvella Nigh, MD;  Location: [facility];  Service: Gynecology;;  . ANTERIOR CERVICAL DECOMP/DISCECTOMY FUSION N/A 05/08/2012   Procedure: ACDF C5-7  ANTERIOR CERVICAL DECOMPRESSION/DISCECTOMY FUSION 2 LEVELS;  Surgeon: Melina Schools, MD;  Location: [place];  Service: Orthopedics;  Laterality: N/A;  . ANTERIOR CERVICAL DECOMP/DISCECTOMY FUSION N/A 02/09/2019   Procedure: ANTERIOR CERVICAL DECOMPRESSION FUSION CERVICAL 3-4, CERVICAL 4-5 WITH INSTRUMENTATION AND ALLOGRAFT;  Surgeon: Phylliss Bob, MD;  Location: [place];  Service: Orthopedics;  Laterality: N/A;  . COLONOSCOPY  10-19-2005  . D & C HYSTEROSCOPE/ RESECTION POLYP/  POSTERIOR AND ENTEROOCELE REPAIRS/  CARDINAL UTEROSACRAL COLPOSUSPENSION /  PERINEAL BODY REPAIR  04-22-2003  . FRACTURE SURGERY Right 2005   Right shoulder with Dr. Theda Sers at [place]  . LAPAROSCOPIC ASSISTED VAGINAL HYSTERECTOMY  11-02-2004   w/  Release posterior vaginal scar  . LAPAROSCOPIC  CHOLECYSTECTOMY  10-11-2001  . TUBAL LIGATION  1988    Family History  Problem Relation Age of Onset  . Cancer Mother        breast  . Arthritis/Rheumatoid Mother   . Cancer Sister        breast  . Cancer Maternal Grandmother        breast    Allergies  Allergen Reactions  . Codeine Anaphylaxis, Hives and Itching  . Oxycodone-Acetaminophen Hives, Shortness Of Breath, Itching and Swelling  . Wheat Bran Anaphylaxis, Hives, Diarrhea and Swelling    AND  GLUTEN CONTAINING  PRODUCTS  . Eggs Or Egg-Derived Products Hives, Diarrhea and Nausea And Vomiting    Only egg yolk  . Lactose Intolerance (Gi) Hives, Diarrhea and Nausea And Vomiting    All milk products  (rice milk is ok)  . Latex Itching and Other (See Comments)    Skin irritation/ reddness  . Lentil Diarrhea and Swelling    Bowel irritation   . Peanuts [Peanut Oil] Hives, Diarrhea and Nausea And Vomiting    THIS INCLUDES PEANUTS AND ALMONDS ( TREE NUTS OK)  . Soybean-Containing Drug Products Hives, Nausea And Vomiting and Swelling  . Tramadol Hives, Nausea And Vomiting and Swelling  . Corn-Containing Products Diarrhea    Current Medications:   Current Outpatient Medications:  .  acetaminophen (TYLENOL) 650 MG CR tablet, Take 1,300 mg by mouth every 8 (eight) hours as needed for pain. , Disp: , Rfl:  .  Ascorbic Acid (VITAMIN C PO), Take 1 tablet by mouth daily., Disp: , Rfl:  .  b complex vitamins tablet, Take 1 tablet by mouth daily., Disp: , Rfl:  .  CALCIUM PO, Take 1 tablet by mouth daily., Disp: , Rfl:  .  Cholecalciferol (VITAMIN D3 PO), Take 1 capsule by mouth daily., Disp: , Rfl:  .  Homeopathic Products ([facility]ICAM ALLERGY RELIEF) GEL, Place 1 application into the nose at bedtime., Disp: , Rfl:  .  levothyroxine (SYNTHROID) 137 MCG tablet, Take 1 tablet (137 mcg total) by mouth daily before breakfast., Disp: 30 tablet, Rfl: 1 .  MELATONIN PO, Take 1 tablet by mouth at bedtime as needed (sleep)., Disp: , Rfl:  .  Menaquinone-7 (VITAMIN K2 PO), Take 1 capsule by mouth daily., Disp: , Rfl:  .  Multiple Vitamin (MULTIVITAMIN) capsule, Take 1 capsule by mouth daily., Disp: , Rfl:  .  VITAMIN E PO, Take 1 capsule by mouth daily., Disp: , Rfl:    Review of Systems:   ROS  Negative unless otherwise specified per HPI.  Vitals:   Vitals:   06/07/20 1403  BP: 122/72  Pulse: 74  Temp: (!) 95.6 F (35.3 C)  TempSrc: Temporal  SpO2: 97%  Weight: 149 lb 12.8 oz (67.9 kg)     Body mass index is  24.18 kg/m.  Physical Exam:   Physical Exam Vitals and nursing note reviewed.  Constitutional:      General: She is not in acute distress.    Appearance: She is well-developed. She is not ill-appearing or toxic-appearing.  Cardiovascular:     Rate and Rhythm: Normal rate and regular rhythm.     Pulses: Normal pulses.     Heart sounds: Normal heart sounds, S1 normal and S2 normal.     Comments: No LE edema Pulmonary:     Effort: Pulmonary effort is normal.     Breath sounds: Normal breath sounds.  Skin:    General: Skin is warm and dry.  Neurological:     Mental Status: She is alert.     GCS: GCS eye subscore is 4. GCS verbal subscore is 5. GCS motor subscore is 6.  Psychiatric:        Speech: Speech normal.        Behavior: Behavior normal. Behavior is cooperative.       Assessment and Plan:   Ula was seen today for establish care and thyroid problem.  Diagnoses and all orders for this visit:  Constipation, unspecified constipation type; Rectal bleeding Uncontrolled. She is very reluctant to trial any medications, such as miralax. High fiber nutrition discussed. Recommended metamucil and prn miralax -- she will consider. Due to rectal bleeding and need for updated colonoscopy, will refer to [facility]. -     Ambulatory referral to Gastroenterology  Hypothyroidism, unspecified type Per labs, uncontrolled. She would like new referral to endocrinology, I will place this today. Her thyroid is currently OVER treated based on last TSH, will lower dose from 125 mcg to 112 mcg. (I inadvertently sent in 137 mcg daily -- I told her to NOT take this, we are contacting pharmacy.) Follow-up with [place] or endo in 4-6 weeks, whichever is sooner, for lab recheck.  Other orders -     levothyroxine (SYNTHROID) 137 MCG tablet; Take 1 tablet (137 mcg total) by mouth daily before breakfast.    Inda Coke, PA-C

## 2020-06-07 NOTE — Patient Instructions (Addendum)
It was great to see you!  Please reach out to Dr. Sherran Needs office about scheduling for mammogram in their office.  Make a lab only appointment in 4-6 weeks to recheck your TSH.  I've increased your dosage of thyroid medication from 125 mcg to 137 mcg daily. You can start tomorrow.  You will be contacted about a referral to the gastroenterologist.  Take care,  Inda Coke PA-C

## 2020-06-14 ENCOUNTER — Encounter: Payer: Self-pay | Admitting: Nurse Practitioner

## 2020-06-29 ENCOUNTER — Ambulatory Visit: Payer: Self-pay | Admitting: Nurse Practitioner

## 2020-07-19 NOTE — Progress Notes (Deleted)
07/19/2020 Welch [id number] May 29, 1960   CHIEF COMPLAINT:   HISTORY OF PRESENT ILLNESS:  Deborah Callahan is a 60 year old female with a past medical history of arthritis, hypothyroidism, kidney stones, interstitial cystitis and GERD. Past cholecystectomy, tubal ligation and cholecystectomy.  She presents to our office today as referred by Inda Coke PA-C for further evaluation regarding constipation and rectal bleeding.  CBC Latest Ref Rng & Units 02/08/2020 02/06/2019 09/24/2014  WBC 4.0 - 10.5 K/uL 4.4 4.1 10.3  Hemoglobin 12.0 - 15.0 g/dL 14.4 15.7(H) 12.5  Hematocrit 36.0 - 46.0 % 42.2 45.7 38.0  Platelets 150 - 400 K/uL 232 238 188   CMP Latest Ref Rng & Units 02/08/2020 02/06/2019 07/17/2014  Glucose 70 - 99 mg/dL 105(H) 111(H) 100(H)  BUN 6 - 20 mg/dL 9 11 12   Creatinine 0.44 - 1.00 mg/dL 0.58 0.69 0.70  Sodium 135 - 145 mmol/L 137 139 142  Potassium 3.5 - 5.1 mmol/L 3.6 3.9 3.7  Chloride 98 - 111 mmol/L 103 104 107  CO2 22 - 32 mmol/L 21(L) 26 27  Calcium 8.9 - 10.3 mg/dL 9.5 9.8 9.7  Total Protein 6.5 - 8.1 g/dL - 7.3 7.4  Total Bilirubin 0.3 - 1.2 mg/dL - 0.7 0.9  Alkaline Phos 38 - 126 U/L - 93 44  AST 15 - 41 U/L - 23 25  ALT 0 - 44 U/L - 25 22    Colonoscopy by Dr. Carlean Purl 8/31/2007Jacqulyn Liner IBS response in sigmoid No polyps or cancer seen Old external hemorrhoid   Past Medical History:  Diagnosis Date  . Arthritis    degenerative cerv. spine , beginning signs of carpal tunnel syndrome- L hand    . Complication of anesthesia   . Cystocele   . Environmental and seasonal allergies   . Family history of anesthesia complication    N&V- mother & sister  . GERD (gastroesophageal reflux disease)    DIET CONTROLLED  . Headache(784.0)    migraines   . History of hiatal hernia   . History of kidney stones   . Hypothyroidism   . IBS (irritable bowel syndrome)   . IC (interstitial cystitis)   . Mild intermittent asthma   .  Pelvic relaxation   . Pneumonia   . PONV (postoperative nausea and vomiting)   . Wears glasses    Past Surgical History:  Procedure Laterality Date  . ABDOMINAL HYSTERECTOMY     partial hysterectomy  . ANTERIOR AND POSTERIOR REPAIR  09/23/2014   Procedure: ANTERIOR (CYSTOCELE) AND POSTERIOR REPAIR (RECTOCELE);  Surgeon: Arvella Nigh, MD;  Location: [facility];  Service: Gynecology;;  . ANTERIOR CERVICAL DECOMP/DISCECTOMY FUSION N/A 05/08/2012   Procedure: ACDF C5-7  ANTERIOR CERVICAL DECOMPRESSION/DISCECTOMY FUSION 2 LEVELS;  Surgeon: Melina Schools, MD;  Location: [place];  Service: Orthopedics;  Laterality: N/A;  . ANTERIOR CERVICAL DECOMP/DISCECTOMY FUSION N/A 02/09/2019   Procedure: ANTERIOR CERVICAL DECOMPRESSION FUSION CERVICAL 3-4, CERVICAL 4-5 WITH INSTRUMENTATION AND ALLOGRAFT;  Surgeon: Phylliss Bob, MD;  Location: [place];  Service: Orthopedics;  Laterality: N/A;  . COLONOSCOPY  10-19-2005  . D & C HYSTEROSCOPE/ RESECTION POLYP/  POSTERIOR AND ENTEROOCELE REPAIRS/  CARDINAL UTEROSACRAL COLPOSUSPENSION /  PERINEAL BODY REPAIR  04-22-2003  . FRACTURE SURGERY Right 2005   Right shoulder with Dr. Theda Sers at [place]  . LAPAROSCOPIC ASSISTED VAGINAL HYSTERECTOMY  11-02-2004   w/  Release posterior vaginal scar  . LAPAROSCOPIC CHOLECYSTECTOMY  10-11-2001  . TUBAL  LIGATION  1988   Social History:  Family History:    reports that she has never smoked. She has never used smokeless tobacco. She reports current alcohol use. She reports that she does not use drugs. family history includes Arthritis/Rheumatoid in her mother; Cancer in her maternal grandmother, mother, and sister.    Allergies  Allergen Reactions  . Codeine Anaphylaxis, Hives and Itching  . Oxycodone-Acetaminophen Hives, Shortness Of Breath, Itching and Swelling  . Wheat Bran Anaphylaxis, Hives, Diarrhea and Swelling    AND  GLUTEN CONTAINING PRODUCTS  . Eggs Or Egg-Derived Products Hives,  Diarrhea and Nausea And Vomiting    Only egg yolk  . Lactose Intolerance (Gi) Hives, Diarrhea and Nausea And Vomiting    All milk products  (rice milk is ok)  . Latex Itching and Other (See Comments)    Skin irritation/ reddness  . Lentil Diarrhea and Swelling    Bowel irritation   . Peanuts [Peanut Oil] Hives, Diarrhea and Nausea And Vomiting    THIS INCLUDES PEANUTS AND ALMONDS ( TREE NUTS OK)  . Soybean-Containing Drug Products Hives, Nausea And Vomiting and Swelling  . Tramadol Hives, Nausea And Vomiting and Swelling  . Corn-Containing Products Diarrhea      Outpatient Encounter Medications as of 07/21/2020  Medication Sig  . acetaminophen (TYLENOL) 650 MG CR tablet Take 1,300 mg by mouth every 8 (eight) hours as needed for pain.   . Ascorbic Acid (VITAMIN C PO) Take 1 tablet by mouth daily.  Marland Kitchen b complex vitamins tablet Take 1 tablet by mouth daily.  Marland Kitchen CALCIUM PO Take 1 tablet by mouth daily.  . Cholecalciferol (VITAMIN D3 PO) Take 1 capsule by mouth daily.  . Homeopathic Products (ZICAM ALLERGY RELIEF) GEL Place 1 application into the nose at bedtime.  Marland Kitchen levothyroxine (SYNTHROID) 112 MCG tablet Take 1 tablet (112 mcg total) by mouth daily.  Marland Kitchen MELATONIN PO Take 1 tablet by mouth at bedtime as needed (sleep).  . Menaquinone-7 (VITAMIN K2 PO) Take 1 capsule by mouth daily.  . Multiple Vitamin (MULTIVITAMIN) capsule Take 1 capsule by mouth daily.  Marland Kitchen VITAMIN E PO Take 1 capsule by mouth daily.   No facility-administered encounter medications on file as of 07/21/2020.     REVIEW OF SYSTEMS: All other systems reviewed and negative except where noted in the History of Present Illness.  Gen: Denies fever, sweats or chills. No weight loss.  CV: Denies chest pain, palpitations or edema. Resp: Denies cough, shortness of breath of hemoptysis.  GI: Denies heartburn, dysphagia, stomach or lower abdominal pain. No diarrhea or constipation.  GU : Denies urinary burning, blood in urine,  increased urinary frequency or incontinence. MS: Denies joint pain, muscles aches or weakness. Derm: Denies rash, itchiness, skin lesions or unhealing ulcers. Psych: Denies depression, anxiety, memory loss, suicidal ideation and confusion. Heme: Denies bruising, bleeding. Neuro:  Denies headaches, dizziness or paresthesias. Endo:  Denies any problems with DM, thyroid or adrenal function.    PHYSICAL EXAM: There were no vitals taken for this visit. General: Well developed ... in no acute distress. Head: Normocephalic and atraumatic. Eyes:  Sclerae non-icteric, conjunctive pink. Ears: Normal auditory acuity. Mouth: Dentition intact. No ulcers or lesions.  Neck: Supple, no lymphadenopathy or thyromegaly.  Lungs: Clear bilaterally to auscultation without wheezes, crackles or rhonchi. Heart: Regular rate and rhythm. No murmur, rub or gallop appreciated.  Abdomen: Soft, nontender, non distended. No masses. No hepatosplenomegaly. Normoactive bowel sounds x 4 quadrants.  Rectal:  Musculoskeletal: Symmetrical with no gross deformities. Skin: Warm and dry. No rash or lesions on visible extremities. Extremities: No edema. Neurological: Alert oriented x 4, no focal deficits.  Psychological:  Alert and cooperative. Normal mood and affect.  ASSESSMENT AND PLAN:    CC:  Inda Coke, PA

## 2020-07-21 ENCOUNTER — Ambulatory Visit: Payer: Self-pay | Admitting: Nurse Practitioner

## 2020-08-08 ENCOUNTER — Ambulatory Visit: Payer: Self-pay | Admitting: Internal Medicine

## 2020-08-11 ENCOUNTER — Other Ambulatory Visit: Payer: Self-pay | Admitting: Physician Assistant

## 2020-08-11 ENCOUNTER — Telehealth: Payer: Self-pay

## 2020-08-11 NOTE — Telephone Encounter (Signed)
Spoke to pt she is at the pharmacy, told her I just sent Rx over for her thyroid medication but could only do 30 day supply due to need an appt and have lab work done. Pt verbalized understanding and will call back to schedule appt.

## 2020-09-02 ENCOUNTER — Encounter: Payer: Self-pay | Admitting: Family Medicine

## 2020-09-02 ENCOUNTER — Other Ambulatory Visit: Payer: Self-pay

## 2020-09-02 ENCOUNTER — Ambulatory Visit (INDEPENDENT_AMBULATORY_CARE_PROVIDER_SITE_OTHER): Payer: Self-pay | Admitting: Family Medicine

## 2020-09-02 VITALS — BP 109/69 | HR 67 | Temp 98.7°F | Ht 66.0 in | Wt 148.8 lb

## 2020-09-02 DIAGNOSIS — F331 Major depressive disorder, recurrent, moderate: Secondary | ICD-10-CM

## 2020-09-02 DIAGNOSIS — E039 Hypothyroidism, unspecified: Secondary | ICD-10-CM

## 2020-09-02 MED ORDER — LEVOTHYROXINE SODIUM 112 MCG PO TABS: 112.0000 ug | ORAL_TABLET | Freq: Every day | ORAL | 0 refills | Status: DC

## 2020-09-02 NOTE — Patient Instructions (Signed)
It was very nice to see you today!  We will continue current dose of thyroid medication until you see the endocrinologist in September.  Take care, Dr Jerline Pain  PLEASE NOTE:  If you had any lab tests please let us know if you have not heard back within a few days. You may see your results on mychart before we have a chance to review them but we will give you a call once they are reviewed by [place]. If we ordered any referrals today, please let us know if you have not heard from their office within the next week.   Please try these tips to maintain a healthy lifestyle:  Eat at least 3 REAL meals and 1-2 snacks per day.  Aim for no more than 5 hours between eating.  If you eat breakfast, please do so within one hour of getting up.   Each meal should contain half fruits/vegetables, one quarter protein, and one quarter carbs (no bigger than a computer mouse)  Cut down on sweet beverages. This includes juice, soda, and sweet tea.   Drink at least 1 glass of water with each meal and aim for at least 8 glasses per day  Exercise at least 150 minutes every week.

## 2020-09-02 NOTE — Assessment & Plan Note (Signed)
PHQ score 1 off medications.

## 2020-09-02 NOTE — Progress Notes (Signed)
   Deborah Callahan is a 60 y.o. female who presents today for an office visit.  Assessment/Plan:  Chronic Problems Addressed Today: Moderate recurrent major depression (HCC) PHQ score 1 off medications.  Hypothyroidism I had extensive discussion with patient regarding her hypothyroidism treatment.  She was recently decreased to 112 mcg daily.  She has had a bit of fatigue, hair loss, and brittle nails on this dose.  She is scheduled to follow-up with endocrinology in about 6 weeks.  She would like to defer further testing until then.  We will continue her current dose for now.     Subjective:  HPI:  See A/p.         Objective:  Physical Exam: BP 109/69   Pulse 67   Temp 98.7 F (37.1 C) (Temporal)   Ht 5\' 6"  (1.676 m)   Wt 148 lb 12.8 oz (67.5 kg)   SpO2 97%   BMI 24.02 kg/m   Gen: No acute distress, resting comfortably  Neuro: Grossly normal, moves all extremities Psych: Normal affect and thought content      Deborah Dingee M. Jerline Pain, MD 09/02/2020 1:39 PM

## 2020-09-02 NOTE — Assessment & Plan Note (Signed)
I had extensive discussion with patient regarding her hypothyroidism treatment.  She was recently decreased to 112 mcg daily.  She has had a bit of fatigue, hair loss, and brittle nails on this dose.  She is scheduled to follow-up with endocrinology in about 6 weeks.  She would like to defer further testing until then.  We will continue her current dose for now.

## 2020-10-26 ENCOUNTER — Encounter: Payer: Self-pay | Admitting: Internal Medicine

## 2020-10-26 ENCOUNTER — Other Ambulatory Visit: Payer: Self-pay

## 2020-10-26 ENCOUNTER — Ambulatory Visit (INDEPENDENT_AMBULATORY_CARE_PROVIDER_SITE_OTHER): Payer: Self-pay | Admitting: Internal Medicine

## 2020-10-26 VITALS — BP 120/80 | HR 79 | Ht 66.0 in | Wt 149.0 lb

## 2020-10-26 DIAGNOSIS — E673 Hypervitaminosis D: Secondary | ICD-10-CM | POA: Insufficient documentation

## 2020-10-26 DIAGNOSIS — E039 Hypothyroidism, unspecified: Secondary | ICD-10-CM

## 2020-10-26 LAB — VITAMIN D 25 HYDROXY (VIT D DEFICIENCY, FRACTURES): VITD: 111.27 ng/mL (ref 30.00–100.00)

## 2020-10-26 LAB — T4, FREE: Free T4: 0.97 ng/dL (ref 0.60–1.60)

## 2020-10-26 LAB — VITAMIN B12: Vitamin B-12: 907 pg/mL (ref 211–911)

## 2020-10-26 LAB — TSH: TSH: 0.03 u[IU]/mL — ABNORMAL LOW (ref 0.35–5.50)

## 2020-10-26 MED ORDER — LEVOTHYROXINE SODIUM 100 MCG PO TABS: 100.0000 ug | ORAL_TABLET | Freq: Every day | ORAL | 6 refills | Status: DC

## 2020-10-26 NOTE — Progress Notes (Signed)
Patient notified of result and needs new script sent to [facility]

## 2020-10-26 NOTE — Progress Notes (Signed)
Name: Deborah Callahan  MRN/ DOB: [id number], [birth year]    Age/ Sex: 60 y.o., female    PCP: Inda Coke, PA   Reason for Endocrinology Evaluation: Hypothyroidism     Date of Initial Endocrinology Evaluation: 10/26/2020     HPI: Ms. Deborah Callahan is a 60 y.o. female with a past medical history of hypothyroidism, interstitial cystitis and IBS . The patient presented for initial endocrinology clinic visit on 10/26/2020 for consultative assistance with her Hypothyroidism .   She has been diagnosed with hypothyroidism many years ago She presented to her PCP in 08/2020 with complaints of fatigue, hair loss and brittle nails.    She had a thyroid ultrasound in early 2022 due to complaints of dysphagia, and choking sensation. The ultrasound was unrevealing without nodularity . Saw ENT in 05/2020 for this.   Saw integrative doctor, was on armour thyroid which was switched to levothyroxine years ago.   Today she is c/o hot flashes, mood wings and nausea in the morning as well as fatigue   She is not a candidate for estrogen therapy due to sister with breast cancer .   Levothyroxine 112 mcg daily (euthyrox)  She is off Biotin  She continues with thyroid disease  She is on a gluten free diet for years   Mother with thyroid disease   HISTORY:  Past Medical History:  Past Medical History:  Diagnosis Date   Arthritis    degenerative cerv. spine , beginning signs of carpal tunnel syndrome- L hand     Complication of anesthesia    Cystocele    Environmental and seasonal allergies    Family history of anesthesia complication    N&V- mother & sister   GERD (gastroesophageal reflux disease)    DIET CONTROLLED   Headache(784.0)    migraines    History of hiatal hernia    History of kidney stones    Hypothyroidism    IBS (irritable bowel syndrome)    IC (interstitial cystitis)    Mild intermittent asthma    Pelvic relaxation    Pneumonia    PONV  (postoperative nausea and vomiting)    Wears glasses    Past Surgical History:  Past Surgical History:  Procedure Laterality Date   ABDOMINAL HYSTERECTOMY     partial hysterectomy   ANTERIOR AND POSTERIOR REPAIR  09/23/2014   Procedure: ANTERIOR (CYSTOCELE) AND POSTERIOR REPAIR (RECTOCELE);  Surgeon: Arvella Nigh, MD;  Location: [facility];  Service: Gynecology;;   ANTERIOR CERVICAL DECOMP/DISCECTOMY FUSION N/A 05/08/2012   Procedure: ACDF C5-7  ANTERIOR CERVICAL DECOMPRESSION/DISCECTOMY FUSION 2 LEVELS;  Surgeon: Melina Schools, MD;  Location: [place];  Service: Orthopedics;  Laterality: N/A;   ANTERIOR CERVICAL DECOMP/DISCECTOMY FUSION N/A 02/09/2019   Procedure: ANTERIOR CERVICAL DECOMPRESSION FUSION CERVICAL 3-4, CERVICAL 4-5 WITH INSTRUMENTATION AND ALLOGRAFT;  Surgeon: Phylliss Bob, MD;  Location: [place];  Service: Orthopedics;  Laterality: N/A;   COLONOSCOPY  10-19-2005   D & C HYSTEROSCOPE/ RESECTION POLYP/  POSTERIOR AND ENTEROOCELE REPAIRS/  CARDINAL UTEROSACRAL COLPOSUSPENSION /  PERINEAL BODY REPAIR  04-22-2003   FRACTURE SURGERY Right 2005   Right shoulder with Dr. Theda Sers at [place]  11-02-2004   w/  Release posterior vaginal scar   LAPAROSCOPIC CHOLECYSTECTOMY  10-11-2001   TUBAL LIGATION  1988    Social History:  reports that she has never smoked. She has never used smokeless tobacco. She reports current alcohol use.  She reports that she does not use drugs. Family History: family history includes Arthritis/Rheumatoid in her mother; Cancer in her maternal grandmother, mother, and sister.   HOME MEDICATIONS: Allergies as of 10/26/2020       Reactions   Codeine Anaphylaxis, Hives, Itching   Oxycodone-acetaminophen Hives, Shortness Of Breath, Itching, Swelling   Wheat Bran Anaphylaxis, Hives, Diarrhea, Swelling   AND  GLUTEN CONTAINING PRODUCTS   Eggs Or Egg-derived Products Hives, Diarrhea, Nausea And  Vomiting   Only egg yolk   Lactose Intolerance (gi) Hives, Diarrhea, Nausea And Vomiting   All milk products  (rice milk is ok) (Goat milk)   Latex Itching, Other (See Comments)   Skin irritation/ reddness   Lentil Diarrhea, Swelling   Bowel irritation    Peanuts [peanut Oil] Hives, Diarrhea, Nausea And Vomiting   THIS INCLUDES PEANUTS AND ALMONDS ( TREE NUTS OK)   Soybean-containing Drug Products Hives, Nausea And Vomiting, Swelling   Tramadol Hives, Nausea And Vomiting, Swelling   Corn-containing Products Diarrhea        Medication List        Accurate as of October 26, 2020  3:36 PM. If you have any questions, ask your nurse or doctor.          acetaminophen 650 MG CR tablet Commonly known as: TYLENOL Take 1,300 mg by mouth every 8 (eight) hours as needed for pain.   b complex vitamins tablet Take 1 tablet by mouth daily.   CALCIUM PO Take 1 tablet by mouth daily.   levothyroxine 100 MCG tablet Commonly known as: SYNTHROID Take 1 tablet (100 mcg total) by mouth daily. What changed:  medication strength how much to take Changed by: Dorita Sciara, MD   MELATONIN PO Take 1 tablet by mouth at bedtime as needed (sleep).   multivitamin capsule Take 1 capsule by mouth daily.   VITAMIN C PO Take 1 tablet by mouth daily.   VITAMIN D3 PO Take 1 capsule by mouth daily.   VITAMIN E PO Take 1 capsule by mouth daily.   VITAMIN K2 PO Take 1 capsule by mouth daily.   Zicam Allergy Relief Gel Place 1 application into the nose at bedtime.          REVIEW OF SYSTEMS: A comprehensive ROS was conducted with the patient and is negative except as per HPI     OBJECTIVE:  VS: BP 120/80 (BP Location: Left Arm, Patient Position: Sitting, Cuff Size: Small)   Pulse 79   Ht '5\' 6"'$  (1.676 m)   Wt 149 lb (67.6 kg)   SpO2 99%   BMI 24.05 kg/m    Wt Readings from Last 3 Encounters:  10/26/20 149 lb (67.6 kg)  09/02/20 148 lb 12.8 oz (67.5 kg)  06/07/20  149 lb 12.8 oz (67.9 kg)     EXAM: General: Pt appears well and is in NAD  Neck: General: Supple without adenopathy. Thyroid: Thyroid size normal.  No goiter or nodules appreciated.   Lungs: Clear with good BS bilat with no rales, rhonchi, or wheezes  Heart: Auscultation: RRR.  Abdomen: Normoactive bowel sounds, soft, nontender, without masses or organomegaly palpable  Extremities:  BL LE: No pretibial edema normal ROM and strength.  Skin: Hair: Texture and amount normal with gender appropriate distribution Skin Inspection: No rashes Skin Palpation: Skin temperature, texture, and thickness normal to palpation  Mental Status: Judgment, insight: Intact Orientation: Oriented to time, place, and person Mood and affect: No depression, anxiety, or  agitation     DATA REVIEWED:   Results for MICAHYA, DOMBROSKY MILES "Levander Campion (MRN [id number]) as of 10/26/2020 15:38  Ref. Range 10/26/2020 11:17  VITD Latest Ref Range: 30.00 - 100.00 ng/mL 111.27 (HH)  Vitamin B12 Latest Ref Range: 211 - 911 pg/mL 907  TSH Latest Ref Range: 0.35 - 5.50 uIU/mL 0.03 (L)  T4,Free(Direct) Latest Ref Range: 0.60 - 1.60 ng/dL 0.97    ASSESSMENT/PLAN/RECOMMENDATIONS:   Hypothyroidism:  -Patient with multiple nonspecific symptoms that could be attributed to her thyroid -Her TSH continues to be low, we discussed the cardiovascular effects as well as increased bone resorption with iatrogenic hyperthyroid -We will reduce the dose as below -- Pt educated extensively on the correct way to take levothyroxine (first thing in the morning with water, 30 minutes before eating or taking other medications). - Pt encouraged to double dose the following day if she were to miss a dose given long half-life of levothyroxine.    Medications : Stop levothyroxine 112 MCG daily Start levothyroxine 100 MCG daily   2.  Hypervitaminosis D:  -Patient instructed to stop all vitamin D supplements at this time       Signed  electronically by: Mack Guise, MD  [facility]  [facility] [address] [place], [place] [address] Phone: [phone] FAX: [phone]   CC: Inda Coke, [place] [address] Phone: [phone] Fax: [phone]   Return to Endocrinology clinic as below: Future Appointments  Date Time Provider [place]  12/21/2020 10:45 AM [facility] None  03/01/2021 10:30 AM Auset Fritzler, Melanie Crazier, MD [facility] None

## 2020-10-26 NOTE — Patient Instructions (Signed)

## 2020-10-27 LAB — THYROID PEROXIDASE ANTIBODY: Thyroperoxidase Ab SerPl-aCnc: <1 IU/mL (ref <9)

## 2020-10-28 ENCOUNTER — Encounter: Payer: Self-pay | Admitting: Internal Medicine

## 2020-10-28 ENCOUNTER — Other Ambulatory Visit: Payer: Self-pay

## 2020-10-28 MED ORDER — LEVOTHYROXINE SODIUM 100 MCG PO TABS: 100.0000 ug | ORAL_TABLET | Freq: Every day | ORAL | 6 refills | Status: DC

## 2020-10-28 NOTE — Telephone Encounter (Signed)
Medication sent.

## 2020-10-28 NOTE — Telephone Encounter (Signed)
Patient called office to check on RX as well.  Patient stated that her pharmacy of choice is [facility]  - Patient requesting that we change that on her chart and resend Levothyroxine.  I also advised patient to try calling the [facility] to try and get them to request RX be moved

## 2020-10-31 ENCOUNTER — Telehealth: Payer: Self-pay | Admitting: Internal Medicine

## 2020-10-31 NOTE — Telephone Encounter (Signed)
Can you please contact the patient and see which pharmacy she wants her levothyroxine prescription to go to?     She called after hours on 10/26/2020 saying that was sent to the wrong pharmacy but there is no instruction on which pharmacy you should go to    Thanks

## 2020-11-03 NOTE — Telephone Encounter (Signed)
Patient states that she was able to get her medication. Patient states that she had call office back and medication was sent.

## 2020-11-18 ENCOUNTER — Telehealth: Payer: Self-pay | Admitting: Internal Medicine

## 2020-11-18 NOTE — Telephone Encounter (Signed)
Pt spouse calling in requesting that they can get proof for a Vit D lab was suggested by Dr Kelton Pillar in order to receive a discount for the test ran at [facility] (family) is self pay. Please call spouse with any info [phone]

## 2020-11-21 ENCOUNTER — Encounter: Payer: Self-pay | Admitting: Internal Medicine

## 2020-11-21 NOTE — Telephone Encounter (Signed)
Patient notified

## 2020-12-03 ENCOUNTER — Other Ambulatory Visit: Payer: Self-pay

## 2020-12-03 ENCOUNTER — Ambulatory Visit (HOSPITAL_COMMUNITY)
Admission: EM | Admit: 2020-12-03 | Discharge: 2020-12-03 | Disposition: A | Discharge status: Home / Self Care | Payer: Self-pay | Attending: Internal Medicine | Admitting: Internal Medicine

## 2020-12-03 DIAGNOSIS — N390 Urinary tract infection, site not specified: Secondary | ICD-10-CM

## 2020-12-03 LAB — POCT URINALYSIS DIPSTICK, ED / UC
Bilirubin Urine: NEGATIVE
Glucose, UA: NEGATIVE mg/dL
Ketones, ur: NEGATIVE mg/dL
Leukocytes,Ua: NEGATIVE
Nitrite: POSITIVE — AB
Protein, ur: NEGATIVE mg/dL
Specific Gravity, Urine: 1.01 (ref 1.005–1.030)
Urobilinogen, UA: 0.2 mg/dL (ref 0.0–1.0)
pH: 6 (ref 5.0–8.0)

## 2020-12-03 MED ORDER — FOSFOMYCIN TROMETHAMINE 3 G PO PACK: 3.0000 g | PACK | Freq: Once | ORAL | 0 refills | Status: AC

## 2020-12-03 MED ORDER — FLUCONAZOLE 150 MG PO TABS: ORAL_TABLET | ORAL | 0 refills | Status: DC

## 2020-12-03 NOTE — ED Triage Notes (Signed)
Pt reports UTI Sx's for one week  .

## 2020-12-03 NOTE — Discharge Instructions (Signed)
Take the Fosfomycin once with water.  You can take the Diflucan 1 pill today.  You can take the second pill in 3 days if you are still having symptoms.   You can take Tylenol and/or Ibuprofen as needed for pain relief and fever reduction.   Make sure you are drinking plenty of fluids, especially water.  You can drink cranberry juice to help with symptom relief, but make sure it is cranberry juice and not cranberry cocktail.  You can also try AZO, cranberry pills, or pyridium as needed.    Return or go to the Emergency Department if symptoms worsen or do not improve in the next few days.

## 2020-12-03 NOTE — ED Provider Notes (Signed)
[place]    CSN: [id number] Arrival date & time: 12/03/20  1006      History   Chief Complaint Chief Complaint  Patient presents with   Urinary Frequency   Back Pain    HPI Deborah Callahan is a 60 y.o. female.   Patient here for evaluation of UTI symptoms that have been ongoing for the past week.  Reports symptoms have been getting progressively worse and noticed some right flank pain this morning.  Reports taking Tylenol with minimal symptom relief.  Ports having some dysuria as well as urinary frequency.  Denies any trauma, injury, or other precipitating event.  Denies any specific alleviating or aggravating factors.  Denies any fevers, chest pain, shortness of breath, N/V/D, numbness, tingling, weakness, abdominal pain, or headaches.    The history is provided by the patient and the spouse.  Urinary Frequency  Back Pain  Past Medical History:  Diagnosis Date   Arthritis    degenerative cerv. spine , beginning signs of carpal tunnel syndrome- L hand     Complication of anesthesia    Cystocele    Environmental and seasonal allergies    Family history of anesthesia complication    N&V- mother & sister   GERD (gastroesophageal reflux disease)    DIET CONTROLLED   Headache(784.0)    migraines    History of hiatal hernia    History of kidney stones    Hypothyroidism    IBS (irritable bowel syndrome)    IC (interstitial cystitis)    Mild intermittent asthma    Pelvic relaxation    Pneumonia    PONV (postoperative nausea and vomiting)    Wears glasses     Patient Active Problem List   Diagnosis Date Noted   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   Anxiety 06/07/2020   Bilateral carpal tunnel syndrome 06/07/2020   Constipation 06/07/2020   Generalized anxiety disorder 06/07/2020   Hypothyroidism 06/07/2020   Kidney stone 06/07/2020   Leukocytopenia 06/07/2020   Moderate recurrent major depression ([place]) 06/07/2020   Sacroiliitis, not elsewhere  classified ([place]) 06/07/2020   Shoulder pain 06/07/2020   Ulnar neuropathy 06/07/2020   Pharyngoesophageal dysphagia 05/23/2020   Hoarseness 05/23/2020   Myelopathy ([place]) 02/09/2019   Chest pain of uncertain etiology 41/96/2229   Pelvic relaxation 09/23/2014    Class: Present on Admission   Cervical disc disorder with radiculopathy of cervical region 04/29/2012   Chronic interstitial cystitis 05/16/2011   Hematuria 05/16/2011    Past Surgical History:  Procedure Laterality Date   ABDOMINAL HYSTERECTOMY     partial hysterectomy   ANTERIOR AND POSTERIOR REPAIR  09/23/2014   Procedure: ANTERIOR (CYSTOCELE) AND POSTERIOR REPAIR (RECTOCELE);  Surgeon: Arvella Nigh, MD;  Location: [facility];  Service: Gynecology;;   ANTERIOR CERVICAL DECOMP/DISCECTOMY FUSION N/A 05/08/2012   Procedure: ACDF C5-7  ANTERIOR CERVICAL DECOMPRESSION/DISCECTOMY FUSION 2 LEVELS;  Surgeon: Melina Schools, MD;  Location: [facility];  Service: Orthopedics;  Laterality: N/A;   ANTERIOR CERVICAL DECOMP/DISCECTOMY FUSION N/A 02/09/2019   Procedure: ANTERIOR CERVICAL DECOMPRESSION FUSION CERVICAL 3-4, CERVICAL 4-5 WITH INSTRUMENTATION AND ALLOGRAFT;  Surgeon: Phylliss Bob, MD;  Location: [facility];  Service: Orthopedics;  Laterality: N/A;   COLONOSCOPY  10-19-2005   D & C HYSTEROSCOPE/ RESECTION POLYP/  POSTERIOR AND ENTEROOCELE REPAIRS/  CARDINAL UTEROSACRAL COLPOSUSPENSION /  PERINEAL BODY REPAIR  04-22-2003   FRACTURE SURGERY Right 2005   Right shoulder with Dr. Theda Sers at [place]  11-02-2004   w/  Release posterior vaginal scar   LAPAROSCOPIC CHOLECYSTECTOMY  10-11-2001   TUBAL LIGATION  1988    OB History   No obstetric history on file.      Home Medications    Prior to Admission medications   Medication Sig Start Date End Date Taking? Authorizing Provider  fluconazole (DIFLUCAN) 150 MG tablet Take one pill today.  Take the second pill in 3  days if you are are still having symptoms. 12/03/20  Yes Pearson Forster, NP  fosfomycin (MONUROL) 3 g PACK Take 3 g by mouth once for 1 dose. 12/03/20 12/03/20 Yes Pearson Forster, NP  acetaminophen (TYLENOL) 650 MG CR tablet Take 1,300 mg by mouth every 8 (eight) hours as needed for pain.     [provider]  Ascorbic Acid (VITAMIN C PO) Take 1 tablet by mouth daily.    [provider]  b complex vitamins tablet Take 1 tablet by mouth daily.    [provider]  CALCIUM PO Take 1 tablet by mouth daily.    [provider]  Cholecalciferol (VITAMIN D3 PO) Take 1 capsule by mouth daily.    [provider]  Homeopathic Products [facility] ALLERGY RELIEF) GEL Place 1 application into the nose at bedtime.    [provider]  levothyroxine (SYNTHROID) 100 MCG tablet Take 1 tablet (100 mcg total) by mouth daily. 10/28/20   Shamleffer, Melanie Crazier, MD  MELATONIN PO Take 1 tablet by mouth at bedtime as needed (sleep).    [provider]  Menaquinone-7 (VITAMIN K2 PO) Take 1 capsule by mouth daily.    [provider]  Multiple Vitamin (MULTIVITAMIN) capsule Take 1 capsule by mouth daily.    [provider]  VITAMIN E PO Take 1 capsule by mouth daily.    [provider]    Family History Family History  Problem Relation Age of Onset   Cancer Mother        breast   Arthritis/Rheumatoid Mother    Cancer Sister        breast   Cancer Maternal Grandmother        breast    Social History Social History   Tobacco Use   Smoking status: Never   Smokeless tobacco: Never  Vaping Use   Vaping Use: Never used  Substance Use Topics   Alcohol use: Yes    Comment: OCCASIONAL WINE   Drug use: No     Allergies   Codeine, Oxycodone-acetaminophen, Wheat bran, Eggs or egg-derived products, Lactose intolerance (gi), Latex, Lentil, Peanuts [peanut oil], Soybean-containing drug products, Tramadol, and Corn-containing  products   Review of Systems Review of Systems  Genitourinary:  Positive for frequency.  Musculoskeletal:  Positive for back pain.  All other systems reviewed and are negative.   Physical Exam Triage Vital Signs ED Triage Vitals [12/03/20 1043]  Enc Vitals Group     BP 130/78     Pulse Rate 75     Resp 18     Temp 98.2 F (36.8 C)     Temp src      SpO2 98 %     Weight      Height      Head Circumference      Peak Flow      Pain Score 8     Pain Loc      Pain Edu?      Excl. in [place]?    No  data found.  Updated Vital Signs BP 130/78   Pulse 75   Temp 98.2 F (36.8 C)   Resp 18   SpO2 98%   Visual Acuity Right Eye Distance:   Left Eye Distance:   Bilateral Distance:    Right Eye Near:   Left Eye Near:    Bilateral Near:     Physical Exam Vitals and nursing note reviewed.  Constitutional:      General: She is not in acute distress.    Appearance: Normal appearance. She is not ill-appearing, toxic-appearing or diaphoretic.  HENT:     Head: Normocephalic and atraumatic.  Eyes:     Conjunctiva/sclera: Conjunctivae normal.  Cardiovascular:     Rate and Rhythm: Normal rate.     Pulses: Normal pulses.  Pulmonary:     Effort: Pulmonary effort is normal.  Abdominal:     General: Abdomen is flat.     Tenderness: There is no right CVA tenderness or left CVA tenderness.  Musculoskeletal:        General: Normal range of motion.     Cervical back: Normal range of motion.  Skin:    General: Skin is warm and dry.  Neurological:     General: No focal deficit present.     Mental Status: She is alert and oriented to person, place, and time.  Psychiatric:        Mood and Affect: Mood normal.     UC Treatments / Results  Labs (all labs ordered are listed, but only abnormal results are displayed) Labs Reviewed  POCT URINALYSIS DIPSTICK, ED / UC - Abnormal; Notable for the following components:      Result Value   Hgb urine dipstick TRACE (*)    Nitrite  POSITIVE (*)    All other components within normal limits    EKG   Radiology No results found.  Procedures Procedures (including critical care time)  Medications Ordered in UC Medications - No data to display  Initial Impression / Assessment and Plan / UC Course  I have reviewed the triage vital signs and the nursing notes.  Pertinent labs & imaging results that were available during my care of the patient were reviewed by me and considered in my medical decision making (see chart for details).    Assessment negative for red flags or concerns.  Urinalysis with trace hemoglobin and positive for nitrites.  Likely a lower urinary tract infection.  We will treat with fosfomycin.  Patient does report frequent yeast infections with antibiotic use so will prescribe Diflucan 1 pill today and may repeat in 3 days if she develops symptoms.  Encourage fluids and rest.  Tylenol and or ibuprofen as needed.  May take AZO, cranberry pills, or Pyridium as needed for symptom management.  Follow-up with primary care provider for reevaluation of soon as possible. Final Clinical Impressions(s) / UC Diagnoses   Final diagnoses:  Lower urinary tract infectious disease     Discharge Instructions      Take the Fosfomycin once with water.  You can take the Diflucan 1 pill today.  You can take the second pill in 3 days if you are still having symptoms.   You can take Tylenol and/or Ibuprofen as needed for pain relief and fever reduction.   Make sure you are drinking plenty of fluids, especially water.  You can drink cranberry juice to help with symptom relief, but make sure it is cranberry juice and not cranberry cocktail.  You can  also try AZO, cranberry pills, or pyridium as needed.    Return or go to the Emergency Department if symptoms worsen or do not improve in the next few days.       ED Prescriptions     Medication Sig Dispense Auth. Provider   fosfomycin (MONUROL) 3 g PACK Take 3 g by  mouth once for 1 dose. 3 g Pearson Forster, NP   fluconazole (DIFLUCAN) 150 MG tablet Take one pill today.  Take the second pill in 3 days if you are are still having symptoms. 2 tablet Pearson Forster, NP      PDMP not reviewed this encounter.   Pearson Forster, NP 12/03/20 [phone]

## 2020-12-06 NOTE — Progress Notes (Signed)
Deborah Callahan is a 60 y.o. female here for a follow up of urinary frequency and back pain  History of Present Illness:   Chief Complaint  Patient presents with  . Dysuria  . Back Pain    HPI  Worsening UTI On October 15,2022 , Deborah Callahan presented to the UC with c/o urinary frequency and back pain. At the time her associated symptoms were dysuria, urinary frequency, and right flank pain. Denied any trauma, injury, fevers, CP, SOB, N/V/D, numbness, tingling, weakness, abdominal pain, or headaches at that time. She did undergo a POCT Urinalysis dipstick which was flagged as abnormal, showing traces of Hgb and positive for nitrite. Levander Campion has been compliant with her prescribed monurol 3g pack which provided no relief.   [name] reports experiencing chills, headache, and worsening back pain last night. She used Azo to treat her symptoms and was provided with minor relief. Deborah. Brossard says she does have a hx of kidney stones but not a kidney infection.    Past Medical History:  Diagnosis Date  . Arthritis    degenerative cerv. spine , beginning signs of carpal tunnel syndrome- L hand    . Complication of anesthesia   . Cystocele   . Environmental and seasonal allergies   . Family history of anesthesia complication    N&V- mother & sister  . GERD (gastroesophageal reflux disease)    DIET CONTROLLED  . Headache(784.0)    migraines   . History of hiatal hernia   . History of kidney stones   . Hypothyroidism   . IBS (irritable bowel syndrome)   . IC (interstitial cystitis)   . Mild intermittent asthma   . Pelvic relaxation   . Pneumonia   . PONV (postoperative nausea and vomiting)   . Wears glasses      Social History   Tobacco Use  . Smoking status: Never  . Smokeless tobacco: Never  Vaping Use  . Vaping Use: Never used  Substance Use Topics  . Alcohol use: Yes    Comment: OCCASIONAL WINE  . Drug use: No    Past Surgical History:  Procedure Laterality Date   . ABDOMINAL HYSTERECTOMY     partial hysterectomy  . ANTERIOR AND POSTERIOR REPAIR  09/23/2014   Procedure: ANTERIOR (CYSTOCELE) AND POSTERIOR REPAIR (RECTOCELE);  Surgeon: Arvella Nigh, MD;  Location: [facility];  Service: Gynecology;;  . ANTERIOR CERVICAL DECOMP/DISCECTOMY FUSION N/A 05/08/2012   Procedure: ACDF C5-7  ANTERIOR CERVICAL DECOMPRESSION/DISCECTOMY FUSION 2 LEVELS;  Surgeon: Melina Schools, MD;  Location: [facility];  Service: Orthopedics;  Laterality: N/A;  . ANTERIOR CERVICAL DECOMP/DISCECTOMY FUSION N/A 02/09/2019   Procedure: ANTERIOR CERVICAL DECOMPRESSION FUSION CERVICAL 3-4, CERVICAL 4-5 WITH INSTRUMENTATION AND ALLOGRAFT;  Surgeon: Phylliss Bob, MD;  Location: [facility];  Service: Orthopedics;  Laterality: N/A;  . COLONOSCOPY  10-19-2005  . D & C HYSTEROSCOPE/ RESECTION POLYP/  POSTERIOR AND ENTEROOCELE REPAIRS/  CARDINAL UTEROSACRAL COLPOSUSPENSION /  PERINEAL BODY REPAIR  04-22-2003  . FRACTURE SURGERY Right 2005   Right shoulder with Dr. Theda Sers at [place]  . LAPAROSCOPIC ASSISTED VAGINAL HYSTERECTOMY  11-02-2004   w/  Release posterior vaginal scar  . LAPAROSCOPIC CHOLECYSTECTOMY  10-11-2001  . TUBAL LIGATION  1988    Family History  Problem Relation Age of Onset  . Cancer Mother        breast  . Arthritis/Rheumatoid Mother   . Cancer Sister        breast  . Cancer  Maternal Grandmother        breast    Allergies  Allergen Reactions  . Codeine Anaphylaxis, Hives and Itching  . Oxycodone-Acetaminophen Hives, Shortness Of Breath, Itching and Swelling  . Wheat Bran Anaphylaxis, Hives, Diarrhea and Swelling    AND  GLUTEN CONTAINING PRODUCTS  . Eggs Or Egg-Derived Products Hives, Diarrhea and Nausea And Vomiting    Only egg yolk  . Lactose Intolerance (Gi) Hives, Diarrhea and Nausea And Vomiting    All milk products  (rice milk is ok) (Goat milk)  . Latex Itching and Other (See Comments)    Skin irritation/ reddness  . Lentil Diarrhea  and Swelling    Bowel irritation   . Peanuts [Peanut Oil] Hives, Diarrhea and Nausea And Vomiting    THIS INCLUDES PEANUTS AND ALMONDS ( TREE NUTS OK)  . Soybean-Containing Drug Products Hives, Nausea And Vomiting and Swelling  . Tramadol Hives, Nausea And Vomiting and Swelling  . Corn-Containing Products Diarrhea    Current Medications:   Current Outpatient Medications:  .  acetaminophen (TYLENOL) 650 MG CR tablet, Take 1,300 mg by mouth every 8 (eight) hours as needed for pain. , Disp: , Rfl:  .  Ascorbic Acid (VITAMIN C PO), Take 1 tablet by mouth daily., Disp: , Rfl:  .  b complex vitamins tablet, Take 1 tablet by mouth daily., Disp: , Rfl:  .  CALCIUM PO, Take 1 tablet by mouth daily., Disp: , Rfl:  .  Cholecalciferol (VITAMIN D3 PO), Take 1 capsule by mouth daily., Disp: , Rfl:  .  Homeopathic Products ([facility]IC[facility] ALLERGY RELIEF) GEL, Place 1 application into the nose at bedtime., Disp: , Rfl:  .  levothyroxine (SYNTHROID) 100 MCG tablet, Take 1 tablet (100 mcg total) by mouth daily., Disp: 30 tablet, Rfl: 6 .  MELATONIN PO, Take 1 tablet by mouth at bedtime as needed (sleep)., Disp: , Rfl:  .  Menaquinone-7 (VITAMIN K2 PO), Take 1 capsule by mouth daily., Disp: , Rfl:  .  Multiple Vitamin (MULTIVITAMIN) capsule, Take 1 capsule by mouth daily., Disp: , Rfl:  .  VITAMIN E PO, Take 1 capsule by mouth daily., Disp: , Rfl:    Review of Systems:   ROS Negative unless otherwise specified per HPI.  Vitals:   Vitals:   12/07/20 0811  BP: 130/80  Pulse: 74  Temp: 97.8 F (36.6 C)  TempSrc: Temporal  SpO2: 98%  Weight: 152 lb (68.9 kg)  Height: 5\' 6"  (1.676 m)     Body mass index is 24.53 kg/m.  Physical Exam:   Physical Exam Vitals and nursing note reviewed.  Constitutional:      General: She is not in acute distress.    Appearance: She is well-developed. She is not ill-appearing or toxic-appearing.  Cardiovascular:     Rate and Rhythm: Normal rate and regular rhythm.      Pulses: Normal pulses.     Heart sounds: Normal heart sounds, S1 normal and S2 normal.  Pulmonary:     Effort: Pulmonary effort is normal.     Breath sounds: Normal breath sounds.  Abdominal:     Tenderness: There is right CVA tenderness and left CVA tenderness.  Skin:    General: Skin is warm and dry.  Neurological:     Mental Status: She is alert.     GCS: GCS eye subscore is 4. GCS verbal subscore is 5. GCS motor subscore is 6.  Psychiatric:        Speech:  Speech normal.        Behavior: Behavior normal. Behavior is cooperative.   Results for orders placed or performed in visit on 12/07/20  POCT urinalysis dipstick  Result Value Ref Range   Color, UA orange    Clarity, UA clear    Glucose, UA Negative Negative   Bilirubin, UA 2    Ketones, UA Negative    Spec Grav, UA 1.015 1.010 - 1.025   Blood, UA 1    pH, UA 5.5 5.0 - 8.0   Protein, UA Negative Negative   Urobilinogen, UA 2.0 (A) 0.2 or 1.0 E.U./dL   Nitrite, UA Positive    Leukocytes, UA Small (1+) (A) Negative   Appearance     Odor      Assessment and Plan:   Dysuria; Acute bilateral low back pain without sciatica Concern for pyelo 1 gram rocephin injection today, tolerated well Start 500 mg rocephin and take twice daily x 1 week If any worsening, needs to go to the ER Update CBC and BMP today   I,Havlyn C Ratchford,acting as a scribe for [employer], PA.,have documented all relevant documentation on the behalf of Inda Coke, PA,as directed by  Inda Coke, PA while in the presence of Inda Coke, [place].   I, Inda Coke, [place], have reviewed all documentation for this visit. The documentation on 12/07/20 for the exam, diagnosis, procedures, and orders are all accurate and complete.   Inda Coke, PA-C

## 2020-12-07 ENCOUNTER — Encounter: Payer: Self-pay | Admitting: Physician Assistant

## 2020-12-07 ENCOUNTER — Ambulatory Visit (INDEPENDENT_AMBULATORY_CARE_PROVIDER_SITE_OTHER): Payer: Self-pay | Admitting: Physician Assistant

## 2020-12-07 ENCOUNTER — Other Ambulatory Visit: Payer: Self-pay

## 2020-12-07 ENCOUNTER — Telehealth: Payer: Self-pay

## 2020-12-07 VITALS — BP 130/80 | HR 74 | Temp 97.8°F | Ht 66.0 in | Wt 152.0 lb

## 2020-12-07 DIAGNOSIS — N12 Tubulo-interstitial nephritis, not specified as acute or chronic: Secondary | ICD-10-CM

## 2020-12-07 DIAGNOSIS — M545 Low back pain, unspecified: Secondary | ICD-10-CM

## 2020-12-07 DIAGNOSIS — R300 Dysuria: Secondary | ICD-10-CM

## 2020-12-07 LAB — POCT URINALYSIS DIPSTICK
Bilirubin, UA: 2
Blood, UA: 1
Glucose, UA: NEGATIVE
Ketones, UA: NEGATIVE
Nitrite, UA: POSITIVE
Protein, UA: NEGATIVE
Spec Grav, UA: 1.015 (ref 1.010–1.025)
Urobilinogen, UA: 2 E.U./dL — AB
pH, UA: 5.5 (ref 5.0–8.0)

## 2020-12-07 LAB — CBC WITH DIFFERENTIAL/PLATELET
Basophils Absolute: 0 10*3/uL (ref 0.0–0.1)
Basophils Relative: 0.4 % (ref 0.0–3.0)
Eosinophils Absolute: 0.2 10*3/uL (ref 0.0–0.7)
Eosinophils Relative: 3.7 % (ref 0.0–5.0)
HCT: 43.4 % (ref 36.0–46.0)
Hemoglobin: 14.5 g/dL (ref 12.0–15.0)
Lymphocytes Relative: 32.1 % (ref 12.0–46.0)
Lymphs Abs: 1.5 10*3/uL (ref 0.7–4.0)
MCHC: 33.4 g/dL (ref 30.0–36.0)
MCV: 93.9 fl (ref 78.0–100.0)
Monocytes Absolute: 0.4 10*3/uL (ref 0.1–1.0)
Monocytes Relative: 9 % (ref 3.0–12.0)
Neutro Abs: 2.6 10*3/uL (ref 1.4–7.7)
Neutrophils Relative %: 54.8 % (ref 43.0–77.0)
Platelets: 227 10*3/uL (ref 150.0–400.0)
RBC: 4.62 Mil/uL (ref 3.87–5.11)
RDW: 12.8 % (ref 11.5–15.5)
WBC: 4.7 10*3/uL (ref 4.0–10.5)

## 2020-12-07 LAB — BASIC METABOLIC PANEL
BUN: 16 mg/dL (ref 6–23)
CO2: 27 mEq/L (ref 19–32)
Calcium: 9.8 mg/dL (ref 8.4–10.5)
Chloride: 104 mEq/L (ref 96–112)
Creatinine, Ser: 0.69 mg/dL (ref 0.40–1.20)
GFR: 93.97 mL/min (ref >60.00)
Glucose, Bld: 103 mg/dL — ABNORMAL HIGH (ref 70–99)
Potassium: 4 mEq/L (ref 3.5–5.1)
Sodium: 139 mEq/L (ref 135–145)

## 2020-12-07 MED ORDER — CIPROFLOXACIN HCL 500 MG PO TABS: 500.0000 mg | ORAL_TABLET | Freq: Two times a day (BID) | ORAL | 0 refills | Status: AC

## 2020-12-07 MED ORDER — CEFTRIAXONE SODIUM 1 G IJ SOLR
1.0000 g | Freq: Once | INTRAMUSCULAR | Status: AC
Start: 2020-12-07 — End: 2020-12-07
  Administered 2020-12-07: 1 g via INTRAMUSCULAR

## 2020-12-07 NOTE — Patient Instructions (Signed)
It was great to see you!  You received 1 gram of rocephin antibiotic injection today  Start oral cipro antibiotic twice daily for 1 week  I will be in touch with urine culture results and blood work  General instructions Make sure you: Pee until your bladder is empty. Do not hold pee for a long time. Empty your bladder after sex. Wipe from front to back after pooping if you are a female. Use each tissue one time when you wipe. Drink enough fluid to keep your pee pale yellow. Keep all follow-up visits as told by your doctor. This is important. Contact a doctor if: You do not get better after 1-2 days. Your symptoms go away and then come back. Get help right away if: You have very bad back pain. You have very bad pain in your lower belly. You have a fever. You are sick to your stomach (nauseous). You are throwing up.   Take care,  Inda Coke PA-C

## 2020-12-07 NOTE — Telephone Encounter (Signed)
Patient states she forgot to ask Deborah Callahan what could be causing her fatigue and nausea. She wants to know if it could be medication side effect or if its just from the pain she has been in.

## 2020-12-07 NOTE — Telephone Encounter (Signed)
Spoke to pt told her Aldona Bar thinks it is from her Pyelonephritis that is going on. She said you should hopefully feel better in a day or two. Told her any increase in pain, fever or lightheadedness you need to go to the ED. Pt verbalized understanding.

## 2020-12-08 LAB — URINE CULTURE
MICRO NUMBER:: 12523315
Result:: NO GROWTH
SPECIMEN QUALITY:: ADEQUATE

## 2020-12-09 ENCOUNTER — Other Ambulatory Visit: Payer: Self-pay | Admitting: *Deleted

## 2020-12-09 DIAGNOSIS — N3010 Interstitial cystitis (chronic) without hematuria: Secondary | ICD-10-CM

## 2020-12-20 ENCOUNTER — Telehealth: Payer: Self-pay | Admitting: Internal Medicine

## 2020-12-20 NOTE — Telephone Encounter (Signed)
Defer to Dr. Carlean Purl on this, happy to see her but only if okay with him. thanks

## 2020-12-20 NOTE — Telephone Encounter (Signed)
Switching to Dr. Havery Moros is fine with me.

## 2020-12-20 NOTE — Telephone Encounter (Signed)
Good afternoon Dr. Carlean Purl,  This patient saw you over 12 years ago and would like to transfer her care to Dr. Havery Moros as he came highly recommended to her by friends and family.  Please let me know if it's OK to switch.  Thank you.

## 2020-12-21 ENCOUNTER — Other Ambulatory Visit: Payer: Self-pay | Admitting: Internal Medicine

## 2020-12-21 ENCOUNTER — Encounter: Payer: Self-pay | Admitting: Gastroenterology

## 2020-12-21 ENCOUNTER — Other Ambulatory Visit (INDEPENDENT_AMBULATORY_CARE_PROVIDER_SITE_OTHER): Payer: Self-pay

## 2020-12-21 ENCOUNTER — Other Ambulatory Visit: Payer: Self-pay

## 2020-12-21 DIAGNOSIS — E673 Hypervitaminosis D: Secondary | ICD-10-CM

## 2020-12-21 DIAGNOSIS — E039 Hypothyroidism, unspecified: Secondary | ICD-10-CM

## 2020-12-22 LAB — TSH: TSH: 0.137 u[IU]/mL — ABNORMAL LOW (ref 0.450–4.500)

## 2020-12-22 LAB — VITAMIN D 25 HYDROXY (VIT D DEFICIENCY, FRACTURES): Vit D, 25-Hydroxy: 96.2 ng/mL (ref 30.0–100.0)

## 2021-01-25 ENCOUNTER — Ambulatory Visit: Payer: Self-pay | Admitting: Gastroenterology

## 2021-01-31 ENCOUNTER — Telehealth: Payer: Self-pay | Admitting: Internal Medicine

## 2021-01-31 NOTE — Telephone Encounter (Signed)
Patient called to advise that her pharmacy could not fill her Levothyroxine because they need approval for a new manufacturer.  Patient is out of medication and her pharmacy is the [facility]  on [place].  Please send new RX to [facility] new manufacturer.  Patients number for call back is [phone]

## 2021-02-01 ENCOUNTER — Ambulatory Visit: Payer: Self-pay | Admitting: Physician Assistant

## 2021-02-01 ENCOUNTER — Encounter: Payer: Self-pay | Admitting: Physician Assistant

## 2021-02-01 ENCOUNTER — Encounter: Payer: Self-pay | Admitting: Internal Medicine

## 2021-02-01 VITALS — BP 138/66 | HR 89 | Ht 66.0 in | Wt 150.0 lb

## 2021-02-01 DIAGNOSIS — K219 Gastro-esophageal reflux disease without esophagitis: Secondary | ICD-10-CM

## 2021-02-01 DIAGNOSIS — R140 Abdominal distension (gaseous): Secondary | ICD-10-CM

## 2021-02-01 DIAGNOSIS — Z91018 Allergy to other foods: Secondary | ICD-10-CM

## 2021-02-01 DIAGNOSIS — K625 Hemorrhage of anus and rectum: Secondary | ICD-10-CM

## 2021-02-01 DIAGNOSIS — R1084 Generalized abdominal pain: Secondary | ICD-10-CM

## 2021-02-01 DIAGNOSIS — R194 Change in bowel habit: Secondary | ICD-10-CM

## 2021-02-01 MED ORDER — OMEPRAZOLE 40 MG PO CPDR: 40.0000 mg | DELAYED_RELEASE_CAPSULE | Freq: Two times a day (BID) | ORAL | 3 refills | Status: DC

## 2021-02-01 NOTE — Progress Notes (Signed)
Agree. Thank you Dr. Bryan Lemma for helping out with her care.

## 2021-02-01 NOTE — Progress Notes (Signed)
Agree with the assessment and plan as outlined by Ellouise Newer, PA-C.  Happy to assist in expediting her endoscopic evaluation with EGD/colonoscopy as described with planned referrals and follow-up as outlined by Anderson Malta.  Maytte Jacot, DO, [facility]

## 2021-02-01 NOTE — Progress Notes (Signed)
Chief Complaint: Rectal bleeding, abdominal pain and diarrhea  HPI:    Deborah Callahan is a 60 year old female with a past medical history as listed below including reflux, IBS, interstitial cystitis and others, known to Dr. Carlean Purl, with excepted transfer request to Dr. Havery Moros, who presents to clinic today with a complaint of abdominal pain, rectal bleeding and diarrhea.    12/19/2005 colonoscopy for rectal bleeding and a change in bowel habits with diarrhea with no polyps or cancers, strong IBS response in the sigmoid and old external hemorrhoids.  Told to repeat in 10 years and given a trial of Diltiazem gel for rectal pain.    12/07/2020 CBC normal.  TSH at the time was still low, she was told to decrease her levothyroxine.  BMP showed a minimally elevated glucose of 103 and otherwise normal.    Today, the patient presents to clinic with a myriad of GI complaints.  She begins by explaining that her son is a physician and she has discussed a lot of this with him.  She tells me "I am not really even sure where to start".  Apparently has a lot of self diagnosed food allergies.  Notes that certain foods give her trouble breathing, hives, itching and swelling or diarrhea/nausea and vomiting so she has just stopped eating them altogether including eggs or egg derived products, lactose, gluten and multiple other things.  She has never been to an allergist but believes that her endocrinologist drew labs at some point that told her that she had celiac disease.  Due to this she is on a very limited diet but tells me that she really does not lose any weight.  Regardless of this she continues with multiple GI complaints.  Describes upper GI symptoms including epigastric pain, right upper quadrant pain, occasional reflux and bloating.  Lower GI symptoms include a change in bowel habits noting that it can sometimes "look like peas or clumps or pudding or just run out", she also describes some episodes of fecal  incontinence at times which are "very embarrassing".  She keeps referring to "leaky gut", also describes some bright red blood on the toilet paper/in her stool occasionally.  She believes this is been related to NSAID use in the past which she only takes on occasion.  Apparently recent dental surgery last Thursday and has been on amoxicillin which is "been tearing up my gut" as well as Ibuprofen 2 tabs once a day, but today's the first day she no longer has jaw pain.  She does eat goat milk yogurt and tells me has a lot of probiotics.  Also describes some rectal discomfort at times.    Denies fever, chills or weight loss.  Past Medical History:  Diagnosis Date   Arthritis    degenerative cerv. spine , beginning signs of carpal tunnel syndrome- L hand     Complication of anesthesia    Cystocele    Environmental and seasonal allergies    Family history of anesthesia complication    N&V- mother & sister   GERD (gastroesophageal reflux disease)    DIET CONTROLLED   Headache(784.0)    migraines    History of hiatal hernia    History of kidney stones    Hypothyroidism    IBS (irritable bowel syndrome)    IC (interstitial cystitis)    Mild intermittent asthma    Pelvic relaxation    Pneumonia    PONV (postoperative nausea and vomiting)    Wears glasses  Past Surgical History:  Procedure Laterality Date   ABDOMINAL HYSTERECTOMY     partial hysterectomy   ANTERIOR AND POSTERIOR REPAIR  09/23/2014   Procedure: ANTERIOR (CYSTOCELE) AND POSTERIOR REPAIR (RECTOCELE);  Surgeon: Arvella Nigh, MD;  Location: [facility];  Service: Gynecology;;   ANTERIOR CERVICAL DECOMP/DISCECTOMY FUSION N/A 05/08/2012   Procedure: ACDF C5-7  ANTERIOR CERVICAL DECOMPRESSION/DISCECTOMY FUSION 2 LEVELS;  Surgeon: Melina Schools, MD;  Location: [place];  Service: Orthopedics;  Laterality: N/A;   ANTERIOR CERVICAL DECOMP/DISCECTOMY FUSION N/A 02/09/2019   Procedure: ANTERIOR CERVICAL DECOMPRESSION  FUSION CERVICAL 3-4, CERVICAL 4-5 WITH INSTRUMENTATION AND ALLOGRAFT;  Surgeon: Phylliss Bob, MD;  Location: [place];  Service: Orthopedics;  Laterality: N/A;   COLONOSCOPY  10/19/2005   D & C HYSTEROSCOPE/ RESECTION POLYP/  POSTERIOR AND ENTEROOCELE REPAIRS/  CARDINAL UTEROSACRAL COLPOSUSPENSION /  PERINEAL BODY REPAIR  04/22/2003   FRACTURE SURGERY Right 2005   Right shoulder with Dr. Theda Sers at [place]  11/02/2004   w/  Release posterior vaginal scar   LAPAROSCOPIC CHOLECYSTECTOMY  10/11/2001   TOOTH EXTRACTION     #31   TUBAL LIGATION  1988    Current Outpatient Medications  Medication Sig Dispense Refill   acetaminophen (TYLENOL) 650 MG CR tablet Take 1,300 mg by mouth every 8 (eight) hours as needed for pain.      Ascorbic Acid (VITAMIN C PO) Take 1 tablet by mouth daily.     b complex vitamins tablet Take 1 tablet by mouth daily.     CALCIUM PO Take 1 tablet by mouth daily.     Cholecalciferol (VITAMIN D3 PO) Take 1 capsule by mouth daily.     levothyroxine (SYNTHROID) 100 MCG tablet Take 1 tablet (100 mcg total) by mouth daily. 30 tablet 6   MELATONIN PO Take 1 tablet by mouth at bedtime as needed (sleep).     Menaquinone-7 (VITAMIN K2 PO) Take 1 capsule by mouth daily.     Multiple Vitamin (MULTIVITAMIN) capsule Take 1 capsule by mouth daily.     VITAMIN E PO Take 1 capsule by mouth daily.     Homeopathic Products (ZICAM ALLERGY RELIEF) GEL Place 1 application into the nose at bedtime.     No current facility-administered medications for this visit.    Allergies as of 02/01/2021 - Review Complete 02/01/2021  Allergen Reaction Noted   Codeine Anaphylaxis, Hives, and Itching 08/13/2011   Oxycodone-acetaminophen Hives, Shortness Of Breath, Itching, and Swelling 06/07/2020   Wheat bran Anaphylaxis, Hives, Diarrhea, and Swelling 04/29/2012   Eggs or egg-derived products Hives, Diarrhea, and Nausea And Vomiting  09/21/2014   Lactose intolerance (gi) Hives, Diarrhea, and Nausea And Vomiting 09/20/2014   Latex Itching and Other (See Comments) 04/29/2012   Lentil Diarrhea and Swelling 02/05/2019   Peanuts [peanut oil] Hives, Diarrhea, and Nausea And Vomiting 04/29/2012   Soybean-containing drug products Hives, Nausea And Vomiting, and Swelling 04/29/2012   Tramadol Hives, Nausea And Vomiting, and Swelling 04/29/2012   Corn-containing products Diarrhea 09/23/2014    Family History  Problem Relation Age of Onset   Cancer Mother        breast   Arthritis/Rheumatoid Mother    Diabetes Mother    Cancer Sister        breast   Cancer Maternal Grandmother        breast   Diabetes Paternal Grandfather     Social History   Socioeconomic History   Marital status:  Married    Spouse name: Harrie Jeans   Number of children: 2   Years of education: 16   Highest education level: Not on file  Occupational History    Comment: painter/artist  Tobacco Use   Smoking status: Never   Smokeless tobacco: Never  Vaping Use   Vaping Use: Never used  Substance and Sexual Activity   Alcohol use: Yes    Comment: OCCASIONAL WINE   Drug use: No   Sexual activity: Not on file  Other Topics Concern   Not on file  Social History Narrative   Lives at home with husband   Retired Statistician   Two sons -- one is 40 and 59 (2022)   One son is in [place]   One son is in [place]   Five grandchildren   Social Determinants of Radio broadcast assistant Strain: Not on file  Food Insecurity: Not on file  Transportation Needs: Not on file  Physical Activity: Not on file  Stress: Not on file  Social Connections: Not on file  Intimate Partner Violence: Not on file    Review of Systems:    Constitutional: No weight loss, fever, chills, weakness or fatigue HEENT: Eyes: No change in vision               Ears, Nose, Throat:  No change in hearing or congestion Skin: No rash or itching Cardiovascular: No  chest pain, chest pressure or palpitations   Respiratory: No SOB or cough Gastrointestinal: See HPI and otherwise negative Genitourinary: No dysuria or change in urinary frequency Neurological: No headache, dizziness or syncope Musculoskeletal: No new muscle or joint pain Hematologic: No bleeding or bruising Psychiatric: No history of depression or anxiety    Physical Exam:  Vital signs: Ht _0  (1.676 m)    Wt 150 lb (68 kg)    BMI 24.21 kg/m   Constitutional:   Pleasant Caucasian female appears to be in NAD, Well developed, Well nourished, alert and cooperative Head:  Normocephalic and atraumatic. Eyes:   PEERL, EOMI. No icterus. Conjunctiva pink. Ears:  Normal auditory acuity. Neck:  Supple Throat: Oral cavity and pharynx without inflammation, swelling or lesion.  Respiratory: Respirations even and unlabored. Lungs clear to auscultation bilaterally.   No wheezes, crackles, or rhonchi.  Cardiovascular: Normal S1, S2. No MRG. Regular rate and rhythm. No peripheral edema, cyanosis or pallor.  Gastrointestinal:  Soft, nondistended, nontender. No rebound or guarding. Normal bowel sounds. No appreciable masses or hepatomegaly. Rectal:  Not performed.  Msk:  Symmetrical without gross deformities. Without edema, no deformity or joint abnormality.  Neurologic:  Alert and  oriented x4;  grossly normal neurologically.  Skin:   Dry and intact without significant lesions or rashes. Psychiatric: Oriented to person, place and time. Demonstrates good judgement and reason without abnormal affect or behaviors.  RELEVANT LABS AND IMAGING: CBC    Component Value Date/Time   WBC 4.7 12/07/2020 0852   RBC 4.62 12/07/2020 0852   HGB 14.5 12/07/2020 0852   HCT 43.4 12/07/2020 0852   PLT 227.0 12/07/2020 0852   MCV 93.9 12/07/2020 0852   MCV 95.6 08/13/2011 1355   MCH 32.0 02/08/2020 1344   MCHC 33.4 12/07/2020 0852   RDW 12.8 12/07/2020 0852   LYMPHSABS 1.5 12/07/2020 0852   MONOABS 0.4  12/07/2020 0852   EOSABS 0.2 12/07/2020 0852   BASOSABS 0.0 12/07/2020 0852    CMP     Component Value Date/Time  NA 139 12/07/2020 0852   K 4.0 12/07/2020 0852   CL 104 12/07/2020 0852   CO2 27 12/07/2020 0852   GLUCOSE 103 (H) 12/07/2020 0852   BUN 16 12/07/2020 0852   CREATININE 0.69 12/07/2020 0852   CREATININE 0.71 08/13/2011 1348   CALCIUM 9.8 12/07/2020 0852   PROT 7.3 02/06/2019 0904   ALBUMIN 4.2 02/06/2019 0904   AST 23 02/06/2019 0904   ALT 25 02/06/2019 0904   ALKPHOS 93 02/06/2019 0904   BILITOT 0.7 02/06/2019 0904   GFRNONAA >60 02/08/2020 1344   GFRAA >60 02/06/2019 0904    Assessment: 1.  Generalized abdominal pain: Bloating and pain in her epigastrium and right upper quadrant as well as lower, a dull discomfort all the time to palpation, chronic thought related to food sensitivities in the past which most were self diagnosed; consider IBS versus colitis versus other 2.  Heartburn/reflux: Intermittent symptoms depending on what she eats 3.  Bloating: With all the above, at some point in the future consider SIBO testing 4.  Rectal bleeding/rectal pain: Off-and-on bleeding seems related to NSAID use, last colonoscopy in 2007, patient is overdue; consider hemorrhoids versus polyps versus colitis versus other 5.  Change in bowel habits: Wide range of stools; consider diet versus colitis versus other 6.  Food intolerances: Seems to have true diagnosis via blood work for celiac, though I do not have records and has also developed other intolerances  Plan: 1.  It does not sound like patient has ever been to the actual allergist to decipher many of these allergies.  I will refer her to one today. 2.  Recommend the patient be scheduled for diagnostic EGD and colonoscopy.  She has requested change of care to Dr. Havery Moros.  He does not have any appointments in the near future so she has been scheduled with Dr. Bryan Lemma for these.  She will continue to follow with Dr.  Havery Moros as her primary GI physician afterwards. 3.  Recommend the patient continue her probiotic yogurt. 4.  Would recommend patient add a fiber supplement such as Metamucil, Citrucel or Benefiber once daily. 5.  We will start the patient on high-dose PPI, Omeprazole 40 mg twice daily, 30-60 minutes for breakfast and dinner.  Prescribed #60 with 2 refills.  Explained the goal is to decrease this in the future. 6.  Patient to follow in clinic per recommendations after time of procedure.  It would likely be good for her to see Dr. Havery Moros in follow-up given that she wants to establish care with him, but I am happy to see her back.  Ellouise Newer, PA-C [facility] 02/01/2021, 9:21 AM  Cc: Inda Coke, [place]

## 2021-02-01 NOTE — Patient Instructions (Addendum)
We have sent the following medications to your pharmacy for you to pick up at your convenience: Omeprazole 40 mg twice daily 30-60 minutes before breakfast and dinner.  Start Benefiber or Citrucel 2 teaspoons in 8 ounces of liquid daily and may increase to twice daily if tolerated.   Referral placed to [facility] - [phone]  You have been scheduled for an endoscopy and colonoscopy. Please follow the written instructions given to you at your visit today. Please pick up your prep supplies at the pharmacy within the next 1-3 days. If you use inhalers (even only as needed), please bring them with you on the day of your procedure.  If you are age 85 or older, your body mass index should be between 23-30. Your Body mass index is 24.21 kg/m. If this is out of the aforementioned range listed, please consider follow up with your Primary Care Provider.  If you are age 25 or younger, your body mass index should be between 19-25. Your Body mass index is 24.21 kg/m. If this is out of the aformentioned range listed, please consider follow up with your Primary Care Provider.   ________________________________________________________  The [facility] providers would like to encourage you to use [facility] to communicate with providers for non-urgent requests or questions.  Due to long hold times on the telephone, sending your provider a message by [facility] may be a faster and more efficient way to get a response.  Please allow 48 business hours for a response.  Please remember that this is for non-urgent requests.  _______________________________________________________

## 2021-02-02 ENCOUNTER — Other Ambulatory Visit: Payer: Self-pay

## 2021-02-02 MED ORDER — LEVOTHYROXINE SODIUM 100 MCG PO TABS: 100.0000 ug | ORAL_TABLET | Freq: Every day | ORAL | 6 refills | Status: DC

## 2021-02-17 ENCOUNTER — Encounter: Payer: Self-pay | Admitting: Gastroenterology

## 2021-02-22 ENCOUNTER — Encounter: Payer: Self-pay | Admitting: Internal Medicine

## 2021-03-01 ENCOUNTER — Ambulatory Visit: Payer: Self-pay | Admitting: Internal Medicine

## 2021-03-07 ENCOUNTER — Encounter: Payer: Self-pay | Admitting: Gastroenterology

## 2021-03-09 ENCOUNTER — Encounter: Payer: Self-pay | Admitting: Physician Assistant

## 2021-03-10 ENCOUNTER — Telehealth: Payer: Self-pay | Admitting: Gastroenterology

## 2021-03-10 ENCOUNTER — Other Ambulatory Visit: Payer: Self-pay

## 2021-03-10 ENCOUNTER — Ambulatory Visit (INDEPENDENT_AMBULATORY_CARE_PROVIDER_SITE_OTHER): Payer: Self-pay | Admitting: Physician Assistant

## 2021-03-10 ENCOUNTER — Encounter: Payer: Self-pay | Admitting: Physician Assistant

## 2021-03-10 ENCOUNTER — Telehealth: Payer: Self-pay

## 2021-03-10 VITALS — BP 110/68 | HR 80 | Temp 97.4°F | Ht 66.0 in | Wt 154.4 lb

## 2021-03-10 DIAGNOSIS — T887XXA Unspecified adverse effect of drug or medicament, initial encounter: Secondary | ICD-10-CM

## 2021-03-10 DIAGNOSIS — F411 Generalized anxiety disorder: Secondary | ICD-10-CM

## 2021-03-10 DIAGNOSIS — R1084 Generalized abdominal pain: Secondary | ICD-10-CM

## 2021-03-10 MED ORDER — BUSPIRONE HCL 7.5 MG PO TABS: 7.5000 mg | ORAL_TABLET | Freq: Two times a day (BID) | ORAL | 2 refills | Status: DC

## 2021-03-10 MED ORDER — FAMOTIDINE 40 MG PO TABS: 40.0000 mg | ORAL_TABLET | Freq: Two times a day (BID) | ORAL | 2 refills | Status: DC

## 2021-03-10 NOTE — Telephone Encounter (Signed)
Please call pt and schedule an appt to discuss My Chart concerns per [facility].

## 2021-03-10 NOTE — Telephone Encounter (Signed)
Patient wrote a handwritten letter and dropped it off at the office for me to review, just seeing it now. She states she is having some side effects from the omeprazole to include skin irritation, itching, blisters in her mouth, muscle pain, fatigue, worsening interstitial cystitis. Unclear if this is truly due to omeprazole or not.  Recommend if she feels the omeprazole is worsening her symptoms to stop it and try pepcid 20mg  BID to see if she tolerates it better. I don't see that she scheduled her EGD or colonoscopy yet, I guess Dr. Bryan Lemma was going to help with that but she never scheduled?  [place] can you help clarify and let her know my recommendations regarding the omeprazole? thanks

## 2021-03-10 NOTE — Telephone Encounter (Signed)
Called and spoke with patient. She states that she saw her PCP today who discontinued the Omeprazole and prescribed her Pepcid 40 mg BID to try. Pt states that she will try this for a couple of weeks and if no improvement she will either let us or her PCP know. She also states that she plans to reach out to the recommended allergist for evaluation of food intolerance. Pt had no other concerns at the end of the call.

## 2021-03-10 NOTE — Patient Instructions (Signed)
It was great to see you!  Call the allergist to schedule appointment: [phone]   2. Start Pecpid (famotidine) 40 mg twice daily per [facility] instruction Follow-up with them if further concerns Take 1-2 hours after levothyroxine (or clarify with your pharmacist about timing) She states that her nurse will reach out to you regarding your cost questions  3. After about a week of Pepcid, start buspar 7.5 mg twice daily for anxiety If you develop suicidal thoughts, please tell someone and immediately proceed to our local 24/7 crisis center, [facility] at the [facility]. [address] [place], [place] [address] [phone].  Let's follow-up in 1 month, sooner if you have concerns.  Take care,  Inda Coke PA-C

## 2021-03-10 NOTE — Progress Notes (Signed)
Deborah Callahan is a 61 y.o. female here for a follow up of generalized abdominal pain.  History of Present Illness:   Chief Complaint  Patient presents with   Abdominal Pain   Gastroesophageal Reflux   Generalized Abdominal Pain; Medication Side Effect On 02/01/21, pt visited Ellouise Newer, gastroenterology with multiple GI complaints including rectal bleeding, heartburn/reflux, generalized abdominal pain, and changes in bowel habits. During this appointment, pt expressed that she now has a very limited diet due to certain foods such as dairy products causing her trouble breathing, hives, itching, swelling, diarrhea, or nausea/vomiting. Additionally she reported that she was experiencing rectal bleeding which she refers to as "leaky gut", but believed it to be caused by NSAID use. After evaluation of sx, pt was referred to an allergist to decipher possible allergies, recommended to complete a colonoscopy as well as add a fiber supplement to her diet, and prescribed omeprazole 40 mg twice daily for GERD.   Currently Deborah Callahan presents with continued abdominal pain and GERD. She has not followed up with an allergist as of yet but is planning to do so soon. States that she was initially compliant with omeprazole 40 mg twice daily but discontinued use as of yesterday due to multiple side effects. Expresses that once she started the medication she had a decrease in energy, headaches, neuropathy in hands, throat irritation, changes in appetite, muscle aches, itchiness accompanied by widespread tiny blisters over her body and in her mouth. Reports that since she has stopped the medication, she has noticed an improvement of sx including more energy and no muscle aches. According to pt, she did initially inform gastroenterology about her concerns with the medication but further action was not taken. At this time she is unsure of the best course of action in regards to medication use or having her  colonoscopy completed. She is willing to trial an additional medication.    Anxiety Deborah Callahan was initially on celexa 20 mg daily, but recently stopped the medication due to noticing that she no longer feels like herself. Pt states she is a glass full type of person but while taking the medication has become one who is very bland and "flower on the wall". She also noticed a decrease in her libido which is a big thing for her and her significant other. At this time she is interested in trialing a new medication. Denies SI/HI.    Past Medical History:  Diagnosis Date   Arthritis    degenerative cerv. spine , beginning signs of carpal tunnel syndrome- L hand     Complication of anesthesia    Cystocele    Environmental and seasonal allergies    Family history of anesthesia complication    N&V- mother & sister   GERD (gastroesophageal reflux disease)    DIET CONTROLLED   Headache(784.0)    migraines    History of hiatal hernia    History of kidney stones    Hypothyroidism    IBS (irritable bowel syndrome)    IC (interstitial cystitis)    Mild intermittent asthma    Pelvic relaxation    Pneumonia    PONV (postoperative nausea and vomiting)    Wears glasses      Social History   Tobacco Use   Smoking status: Never   Smokeless tobacco: Never  Vaping Use   Vaping Use: Never used  Substance Use Topics   Alcohol use: Yes    Comment: OCCASIONAL WINE   Drug use: No  Past Surgical History:  Procedure Laterality Date   ABDOMINAL HYSTERECTOMY     partial hysterectomy   ANTERIOR AND POSTERIOR REPAIR  09/23/2014   Procedure: ANTERIOR (CYSTOCELE) AND POSTERIOR REPAIR (RECTOCELE);  Surgeon: Arvella Nigh, MD;  Location: [facility];  Service: Gynecology;;   ANTERIOR CERVICAL DECOMP/DISCECTOMY FUSION N/A 05/08/2012   Procedure: ACDF C5-7  ANTERIOR CERVICAL DECOMPRESSION/DISCECTOMY FUSION 2 LEVELS;  Surgeon: Melina Schools, MD;  Location: [place];  Service: Orthopedics;   Laterality: N/A;   ANTERIOR CERVICAL DECOMP/DISCECTOMY FUSION N/A 02/09/2019   Procedure: ANTERIOR CERVICAL DECOMPRESSION FUSION CERVICAL 3-4, CERVICAL 4-5 WITH INSTRUMENTATION AND ALLOGRAFT;  Surgeon: Phylliss Bob, MD;  Location: [facility];  Service: Orthopedics;  Laterality: N/A;   COLONOSCOPY  10/19/2005   D & C HYSTEROSCOPE/ RESECTION POLYP/  POSTERIOR AND ENTEROOCELE REPAIRS/  CARDINAL UTEROSACRAL COLPOSUSPENSION /  PERINEAL BODY REPAIR  04/22/2003   FRACTURE SURGERY Right 2005   Right shoulder with Dr. Theda Sers at [facility]  11/02/2004   w/  Release posterior vaginal scar   LAPAROSCOPIC CHOLECYSTECTOMY  10/11/2001   TOOTH EXTRACTION     #31   TUBAL LIGATION  1988    Family History  Problem Relation Age of Onset   Cancer Mother        breast   Arthritis/Rheumatoid Mother    Diabetes Mother    Cancer Sister        breast   Cancer Maternal Grandmother        breast   Diabetes Paternal Grandfather     Allergies  Allergen Reactions   Codeine Anaphylaxis, Hives and Itching   Oxycodone-Acetaminophen Hives, Shortness Of Breath, Itching and Swelling   Wheat Bran Anaphylaxis, Hives, Diarrhea and Swelling    AND  GLUTEN CONTAINING PRODUCTS   Eggs Or Egg-Derived Products Hives, Diarrhea and Nausea And Vomiting    Only egg yolk   Lactose Intolerance (Gi) Hives, Diarrhea and Nausea And Vomiting    All milk products  (rice milk is ok) (Goat milk)   Latex Itching and Other (See Comments)    Skin irritation/ reddness   Lentil Diarrhea and Swelling    Bowel irritation    Peanuts [Peanut Oil] Hives, Diarrhea and Nausea And Vomiting    THIS INCLUDES PEANUTS AND ALMONDS ( TREE NUTS OK)   Soybean-Containing Drug Products Hives, Nausea And Vomiting and Swelling   Tramadol Hives, Nausea And Vomiting and Swelling   Corn-Containing Products Diarrhea    Current Medications:   Current Outpatient Medications:    acetaminophen  (TYLENOL) 650 MG CR tablet, Take 1,300 mg by mouth every 8 (eight) hours as needed for pain. , Disp: , Rfl:    Ascorbic Acid (VITAMIN C PO), Take 1 tablet by mouth daily., Disp: , Rfl:    b complex vitamins tablet, Take 1 tablet by mouth daily., Disp: , Rfl:    CALCIUM PO, Take 1 tablet by mouth daily., Disp: , Rfl:    Homeopathic Products (ZICAM ALLERGY RELIEF) GEL, Place 1 application into the nose at bedtime., Disp: , Rfl:    levothyroxine (SYNTHROID) 100 MCG tablet, Take 1 tablet (100 mcg total) by mouth daily., Disp: 30 tablet, Rfl: 6   MELATONIN PO, Take 1 tablet by mouth at bedtime as needed (sleep)., Disp: , Rfl:    Menaquinone-7 (VITAMIN K2 PO), Take 1 capsule by mouth daily., Disp: , Rfl:    Multiple Vitamin (MULTIVITAMIN) capsule, Take 1 capsule by mouth daily., Disp: , Rfl:  Review of Systems:   Review of Systems  Gastrointestinal:  Positive for abdominal pain.  Negative unless otherwise specified per HPI. Vitals:   Vitals:   03/10/21 1000  BP: 110/68  Pulse: 80  Temp: (!) 97.4 F (36.3 C)  TempSrc: Temporal  SpO2: 94%  Weight: 154 lb 6.1 oz (70 kg)  Height: 5\' 6"  (1.676 m)     Body mass index is 24.92 kg/m.  Physical Exam:   Physical Exam Vitals and nursing note reviewed.  Constitutional:      General: She is not in acute distress.    Appearance: She is well-developed. She is not ill-appearing or toxic-appearing.  Cardiovascular:     Rate and Rhythm: Normal rate and regular rhythm.     Pulses: Normal pulses.     Heart sounds: Normal heart sounds, S1 normal and S2 normal.  Pulmonary:     Effort: Pulmonary effort is normal.     Breath sounds: Normal breath sounds.  Skin:    General: Skin is warm and dry.  Neurological:     Mental Status: She is alert.     GCS: GCS eye subscore is 4. GCS verbal subscore is 5. GCS motor subscore is 6.  Psychiatric:        Speech: Speech normal.        Behavior: Behavior normal. Behavior is cooperative.    Assessment  and Plan:   GAD (generalized anxiety disorder) Uncontrolled Will trial buspar 7.5 mg twice daily  I recommend waiting around a week to start this after trying pepcid to make sure she is not having new side effects and she can determine where side effects are coming from should she have any Patient denies SI/HI I advised patient that if they develop any SI, to tell someone immediately and seek medical attention Follow up in 1 month, sooner if concerns occur  Medication side effect Stop prilosec/omeprazole  -- I've added this intolerance to her allergy list I also personally reached out to GI PA Alen Blew about patient, her recommendation was to start pepcid 40 mg twice daily  Informed patient to take pepcid about 1-2 hours after levothyroxine Follow up with gastroenterology if further concerns  Generalized abdominal pain Recommended patient schedule an appointment  with allergist for further evaluation of possible food allergies Gastroenterology will reach out to patient in regard to colonoscopy cost questions -- I also informed Ms Ignatius Specking of patient's concerns If pepcid causes trouble for her, I recommend that she reach out to GI for further evaluation  I,Havlyn C Ratchford,acting as a scribe for Inda Coke, PA.,have documented all relevant documentation on the behalf of Inda Coke, PA,as directed by  Inda Coke, PA while in the presence of Inda Coke, [place].  I, Inda Coke, [place], have reviewed all documentation for this visit. The documentation on 03/10/21 for the exam, diagnosis, procedures, and orders are all accurate and complete.   Inda Coke, PA-C

## 2021-03-10 NOTE — Telephone Encounter (Signed)
-----   Message from Levin Erp, [place] sent at 03/10/2021 10:28 AM EST ----- Regarding: Please call Can you please call patient- she has some questions apparently and has had a hard time getting through to anyone at our office.  Thanks-JLL

## 2021-03-10 NOTE — Telephone Encounter (Signed)
Called and spoke with patient. She states that she knows that she needs to have an EGD and colonoscopy. She has done some research and has found out that it would be $2,000 less for her to have her procedures at [facility]. I explained to her that our doctors only perform procedures at [facility] or [facility] hospital. She stated that she would like to keep Dr. Bryan Lemma as her provider, but do to being self-pay she is trying to find the most affordable option. She states that she will have to "start at ground zero". Pt was thankful for the return call. She did not have any other concerns at the end of the call.

## 2021-03-14 NOTE — Telephone Encounter (Signed)
Tried to contact pt no answer, voicemail box is full unable to leave message.

## 2021-03-14 NOTE — Telephone Encounter (Signed)
Samantha notified of patient's response.

## 2021-03-16 ENCOUNTER — Telehealth: Payer: Self-pay

## 2021-03-16 MED ORDER — SUCRALFATE 1 GM/10ML PO SUSP: 1.0000 g | Freq: Three times a day (TID) | ORAL | 0 refills | Status: DC

## 2021-03-16 NOTE — Telephone Encounter (Signed)
Patient states she seen Samantha on 1/20.  She states that [place] prescribed her a medication for ulcers.  States the medication is not working.  States she has consulted with her son and is requesting a script for sucralfate liquid.  Would like this to be sent to [facility] [address].     Also patient states she is self pay and is in need for a colonoscopy and endoscopy.  States she can not afford the prices with [facility].  States she has a friend who is a Marine scientist working at [facility] who states the cost of a colonoscopy and endoscopy is cheaper there.  Would like to know if Aldona Bar knows a a GI specialist who works out of [employer].

## 2021-03-16 NOTE — Telephone Encounter (Signed)
Spoke to pt told her Rx for Carafate was sent to pharmacy. Use three times a day with meals and at bedtime for 2 weeks. Pt verbalized understanding. Told her Aldona Bar does not know any GI providers at [facility]. Pt verbalized understanding. Rx sent.

## 2021-03-16 NOTE — Telephone Encounter (Signed)
Please see message and advise 

## 2021-03-29 NOTE — Telephone Encounter (Signed)
FYI, see message from patient.

## 2021-03-29 NOTE — Telephone Encounter (Signed)
Patient has called in stating she has had several people praying for the healing of her body.   States she is feeling so much better and wanted to share this information with Aldona Bar.

## 2021-04-11 ENCOUNTER — Other Ambulatory Visit: Payer: Self-pay

## 2021-04-11 ENCOUNTER — Ambulatory Visit (INDEPENDENT_AMBULATORY_CARE_PROVIDER_SITE_OTHER): Payer: Self-pay | Admitting: Physician Assistant

## 2021-04-11 ENCOUNTER — Encounter: Payer: Self-pay | Admitting: Physician Assistant

## 2021-04-11 VITALS — BP 100/62 | HR 76 | Ht 66.0 in | Wt 149.1 lb

## 2021-04-11 DIAGNOSIS — R194 Change in bowel habit: Secondary | ICD-10-CM

## 2021-04-11 DIAGNOSIS — R140 Abdominal distension (gaseous): Secondary | ICD-10-CM

## 2021-04-11 DIAGNOSIS — R1084 Generalized abdominal pain: Secondary | ICD-10-CM

## 2021-04-11 DIAGNOSIS — R197 Diarrhea, unspecified: Secondary | ICD-10-CM

## 2021-04-11 DIAGNOSIS — R1013 Epigastric pain: Secondary | ICD-10-CM

## 2021-04-11 DIAGNOSIS — R159 Full incontinence of feces: Secondary | ICD-10-CM

## 2021-04-11 NOTE — Progress Notes (Signed)
Chief Complaint: Abdominal pain, diarrhea and hematochezia with nausea and vomiting  HPI:    Deborah Callahan is a 61 year old female with a past medical history as listed below including reflux, IBS, ICD and multiple others, known to Dr. Havery Moros, who presents clinic today with abdominal pain, diarrhea and hematochezia with nausea and vomiting.    12/19/2005 colonoscopy for rectal bleeding and a change in bowel habits with diarrhea with no polyps or cancers, strong IBS response in the sigmoid and old external hemorrhoids. Told to repeat in 10 years and given a trial of Diltiazem gel for rectal pain .    02/01/2021 patient came in with a myriad of GI complaints and explained that her son was a physician and she had discussed a lot of this with him.  Discussed generalized abdominal pain, bloating and pain as well as heartburn/reflux, bloating, rectal bleeding/rectal pain, change in bowel habits and food intolerances.  She was referred to an allergist.  Also scheduled for diagnostic EGD and colonoscopy.  These were scheduled with Dr. Bryan Lemma the patient had requested change of care to Dr. Havery Moros.  He did not have availability for procedures.  She was also started on a high-dose PPI, omeprazole 40 mg twice daily.    03/10/2021 patient had dropped off a handwritten letter to the office.  She describes some side effects from Omeprazole including skin irritation, itching, blisters, muscle pain, fatigue, worsening IC.  It was discussed that time that she should stop this medicine if she thought it was giving her the symptoms and try Pepcid 20 mg twice daily.    03/16/2021 patient discussed Carafate with her PCP and was sent in a prescription.  Described that she could not afford the prices of an EGD and colonoscopy at our clinic.    Today, patient returns with her husband.  She has the same complaints as last visit though her stools have returned to mostly loose urgent stools with some incontinence which is her  most disturbing symptom now.  Tells me it does not seem to matter what she eats.  Apparently has limited her diet to just salmon or a white fish with an occasional veg but even this will run right through her.  Tells me that this comes so fast that she oftentimes cannot make it to the bathroom and is wearing pads/depends due to this.  Tells me she is very frustrated with ongoing symptoms.  Does describe though this morning that about 6:00 she was awakened with a solid more dark looking stool, "I been eating a lot of blueberries", and then this was followed up with loose urgent stool later in the morning prior to eating.      Also tried Carafate, Pepcid and Omeprazole but seemed to have more side effects from all of these medicines.  Continues with an epigastric discomfort as well.  Knows she needs procedures.      Her husband is with her today and tells me that he called around and it was cheaper for them to have it done at [facility] being self-pay.  He is trying to figure out how to get this done for her.    Denies fever, chills or symptoms that awaken her from sleep.  Past Medical History:  Diagnosis Date   Arthritis    degenerative cerv. spine , beginning signs of carpal tunnel syndrome- L hand     Complication of anesthesia    Cystocele    Environmental and seasonal allergies    Family  history of anesthesia complication    N&V- mother & sister   GERD (gastroesophageal reflux disease)    DIET CONTROLLED   Headache(784.0)    migraines    History of hiatal hernia    History of kidney stones    Hypothyroidism    IBS (irritable bowel syndrome)    IC (interstitial cystitis)    Mild intermittent asthma    Pelvic relaxation    Pneumonia    PONV (postoperative nausea and vomiting)    Wears glasses     Past Surgical History:  Procedure Laterality Date   ABDOMINAL HYSTERECTOMY     partial hysterectomy   ANTERIOR AND POSTERIOR REPAIR  09/23/2014   Procedure: ANTERIOR (CYSTOCELE) AND POSTERIOR  REPAIR (RECTOCELE);  Surgeon: Arvella Nigh, MD;  Location: [facility];  Service: Gynecology;;   ANTERIOR CERVICAL DECOMP/DISCECTOMY FUSION N/A 05/08/2012   Procedure: ACDF C5-7  ANTERIOR CERVICAL DECOMPRESSION/DISCECTOMY FUSION 2 LEVELS;  Surgeon: Melina Schools, MD;  Location: [place];  Service: Orthopedics;  Laterality: N/A;   ANTERIOR CERVICAL DECOMP/DISCECTOMY FUSION N/A 02/09/2019   Procedure: ANTERIOR CERVICAL DECOMPRESSION FUSION CERVICAL 3-4, CERVICAL 4-5 WITH INSTRUMENTATION AND ALLOGRAFT;  Surgeon: Phylliss Bob, MD;  Location: [place];  Service: Orthopedics;  Laterality: N/A;   COLONOSCOPY  10/19/2005   D & C HYSTEROSCOPE/ RESECTION POLYP/  POSTERIOR AND ENTEROOCELE REPAIRS/  CARDINAL UTEROSACRAL COLPOSUSPENSION /  PERINEAL BODY REPAIR  04/22/2003   FRACTURE SURGERY Right 2005   Right shoulder with Dr. Theda Sers at [facility]  11/02/2004   w/  Release posterior vaginal scar   LAPAROSCOPIC CHOLECYSTECTOMY  10/11/2001   TOOTH EXTRACTION     #31   TUBAL LIGATION  1988    Current Outpatient Medications  Medication Sig Dispense Refill   levothyroxine (SYNTHROID) 100 MCG tablet Take 1 tablet (100 mcg total) by mouth daily. 30 tablet 6   acetaminophen (TYLENOL) 650 MG CR tablet Take 1,300 mg by mouth every 8 (eight) hours as needed for pain.      Ascorbic Acid (VITAMIN C PO) Take 1 tablet by mouth daily.     b complex vitamins tablet Take 1 tablet by mouth daily.     CALCIUM PO Take 1 tablet by mouth daily.     famotidine (PEPCID) 40 MG tablet Take 1 tablet (40 mg total) by mouth 2 (two) times daily. 60 tablet 2   Homeopathic Products (ZICAM ALLERGY RELIEF) GEL Place 1 application into the nose at bedtime.     MELATONIN PO Take 1 tablet by mouth at bedtime as needed (sleep).     Menaquinone-7 (VITAMIN K2 PO) Take 1 capsule by mouth daily.     Multiple Vitamin (MULTIVITAMIN) capsule Take 1 capsule by mouth daily.     No  current facility-administered medications for this visit.    Allergies as of 04/11/2021 - Review Complete 04/11/2021  Allergen Reaction Noted   Codeine Anaphylaxis, Hives, and Itching 08/13/2011   Oxycodone-acetaminophen Hives, Shortness Of Breath, Itching, and Swelling 06/07/2020   Wheat bran Anaphylaxis, Hives, Diarrhea, and Swelling 04/29/2012   Buspar [buspirone] Hives and Itching 04/11/2021   Carafate [sucralfate] Hives, Itching, and Nausea And Vomiting 04/11/2021   Eggs or egg-derived products Hives, Diarrhea, and Nausea And Vomiting 09/21/2014   Lactose intolerance (gi) Hives, Diarrhea, and Nausea And Vomiting 09/20/2014   Latex Itching and Other (See Comments) 04/29/2012   Lentil Diarrhea and Swelling 02/05/2019   Omeprazole Other (See Comments) 03/10/2021   Peanuts [peanut  oil] Hives, Diarrhea, and Nausea And Vomiting 04/29/2012   Soybean-containing drug products Hives, Nausea And Vomiting, and Swelling 04/29/2012   Tramadol Hives, Nausea And Vomiting, and Swelling 04/29/2012   Corn-containing products Diarrhea 09/23/2014    Family History  Problem Relation Age of Onset   Cancer Mother        breast   Arthritis/Rheumatoid Mother    Diabetes Mother    Cancer Sister        breast   Cancer Maternal Grandmother        breast   Diabetes Paternal Grandfather     Social History   Socioeconomic History   Marital status: Married    Spouse name: Psychologist, prison and probation services   Number of children: 2   Years of education: 16   Highest education level: Not on file  Occupational History    Comment: painter/artist  Tobacco Use   Smoking status: Never   Smokeless tobacco: Never  Vaping Use   Vaping Use: Never used  Substance and Sexual Activity   Alcohol use: Yes    Comment: OCCASIONAL WINE   Drug use: No   Sexual activity: Not on file  Other Topics Concern   Not on file  Social History Narrative   Lives at home with husband   Retired Statistician   Two sons -- one is 59 and  51 (2022)   One son is in [place]   One son is in [place]   Five grandchildren   Social Determinants of Radio broadcast assistant Strain: Not on file  Food Insecurity: Not on file  Transportation Needs: Not on file  Physical Activity: Not on file  Stress: Not on file  Social Connections: Not on file  Intimate Partner Violence: Not on file    Review of Systems:    Constitutional: No weight loss, fever or chills Cardiovascular: No chest pain   Respiratory: No SOB Gastrointestinal: See HPI and otherwise negative   Physical Exam:  Vital signs: BP 100/62 (BP Location: Left Arm, Patient Position: Sitting, Cuff Size: Normal)    Pulse 76    Ht 5' 6"  (1.676 m) Comment: height measured without shoes   Wt 149 lb 2 oz (67.6 kg)    BMI 24.07 kg/m    Constitutional:   Pleasant Caucasian female appears to be in NAD, Well developed, Well nourished, alert and cooperative Respiratory: Respirations even and unlabored. Lungs clear to auscultation bilaterally.   No wheezes, crackles, or rhonchi.  Cardiovascular: Normal S1, S2. No MRG. Regular rate and rhythm. No peripheral edema, cyanosis or pallor.  Gastrointestinal:  Soft, nondistended, nontender. No rebound or guarding. Normal bowel sounds. No appreciable masses or hepatomegaly. Psychiatric: Anxious demonstrates good judgement and reason without abnormal affect or behaviors.  No recent labs or imaging.  Assessment: 1.  Generalized abdominal pain: Bloating and pain in her epigastrium and right upper quadrant as well as over her abdomen, chronic and she thinks related to food sensitivities, though nothing seems to help; consider IBS versus IBD versus other 2.  Heartburn/reflux: With epigastric discomfort, unable to tolerate Omeprazole, Pepcid or Carafate; consider H. pylori versus other 3.  Bloating: With above 4.  Rectal bleeding/rectal pain: Off-and-on, last colonoscopy in 2007; consider polyps versus hemorrhoids versus IBD 5.  Change in bowel  habits: To now liquid stool with urgency and incontinence most of the time 6.  Food intolerances  Plan: 1.  Ordered stool studies today to include O&P, fecal calprotectin, fecal pancreatic elastase  and GI pathogen panel and H. pylori 2.  Discussed EGD and colonoscopy with the patient further.  She needs to have these procedures done so we can get her diagnosis and give her the right medicine as it appears she has side effects from everything else.  She verbalized understanding.  I will check with our office staff so that we can get figure out how to get this scheduled for her given that she is self-pay. 3.  Patient to follow in clinic for recommendations after above.  Did go ahead and make her a follow-up appointment with Dr. Havery Moros she wants to establish care with him and is still not met him.  If she is scheduled for procedures that should be with Dr. Havery Moros if possible. 4.  Also encouraged patient to follow with allergist  Deborah Newer, PA-C [facility] 04/11/2021, 10:06 AM  Cc: Inda Coke, [place]

## 2021-04-11 NOTE — Addendum Note (Signed)
Addended by: Aleatha Borer on: 04/11/2021 02:28 PM   Modules accepted: Orders

## 2021-04-11 NOTE — Progress Notes (Signed)
Agree with assessment and plan as outlined.  She warrants EGD and colonoscopy to further evaluate, sounds like lack of insurance is an issue.  She should be quoted a price for these exams, hopefully she can proceed, if not due to financial issues, unclear when she may be getting insurance?  Thanks

## 2021-04-11 NOTE — Patient Instructions (Signed)
If you are age 62 or younger, your body mass index should be between 19-25. Your Body mass index is 24.07 kg/m. If this is out of the aformentioned range listed, please consider follow up with your Primary Care Provider.  ________________________________________________________  The [place] [facility] providers would like to encourage you to use [facility] to communicate with providers for non-urgent requests or questions.  Due to long hold times on the telephone, sending your provider a message by [facility] may be a faster and more efficient way to get a response.  Please allow 48 business hours for a response.  Please remember that this is for non-urgent requests.  _______________________________________________________  Your provider has requested that you go to the basement level for lab work before leaving today. Press "B" on the elevator. The lab is located at the first door on the left as you exit the elevator.  We will contact you with the results of you labs.  We have scheduled you to follow up with Dr. Havery Moros on June 06, 2021 at 1:40 pm.  Thank you for entrusting me with your care and choosing [facility].  Ellouise Newer, PA-C

## 2021-04-11 NOTE — Addendum Note (Signed)
Addended by: Kandy Garrison on: 04/11/2021 11:21 AM   Modules accepted: Orders

## 2021-04-13 ENCOUNTER — Telehealth: Payer: Self-pay | Admitting: Physician Assistant

## 2021-04-13 NOTE — Telephone Encounter (Signed)
Pt stopped by at the front desk with a letter to give to [place]. Barbara delivered letter to her.

## 2021-04-13 NOTE — Telephone Encounter (Signed)
Called and spoke with patient regarding Jennifer's recommendations. Pt states that she collected the stool sample the first time but she got urine in the sample and had to come pick up a fresh kit. Pt states that she is going to try to get the stool tests completed as soon as she can. She knows that Anderson Malta will further advise once results come back, she knows that it will take several days before we get the results . Advised pt that it is fine for her to kiss her husband. Pt was thankful for the call. Answered all of her questions. She verbalized understanding and had no concerns at the end the of the call.

## 2021-04-13 NOTE — Telephone Encounter (Signed)
Levin Erp, PA  [place], [place], RN  [place],   I received a handwritten letter from the patient at 2:25 today in regards to H. pylori testing.  Apparently she has had trouble with a home stool test.  We can order the urea breath test if she would like.  Also I do not think that she needs to stop kissing her husband.  Even though H. pylori can be passed through direct contact we do not think this is worsening her situation, both of them should be fine.   Please call her,  Thanks,  JLL

## 2021-04-17 ENCOUNTER — Telehealth: Payer: Self-pay | Admitting: Physician Assistant

## 2021-04-17 DIAGNOSIS — R194 Change in bowel habit: Secondary | ICD-10-CM

## 2021-04-17 DIAGNOSIS — R1013 Epigastric pain: Secondary | ICD-10-CM

## 2021-04-17 DIAGNOSIS — R1084 Generalized abdominal pain: Secondary | ICD-10-CM

## 2021-04-17 DIAGNOSIS — R140 Abdominal distension (gaseous): Secondary | ICD-10-CM

## 2021-04-17 DIAGNOSIS — R159 Full incontinence of feces: Secondary | ICD-10-CM

## 2021-04-17 DIAGNOSIS — R197 Diarrhea, unspecified: Secondary | ICD-10-CM

## 2021-04-17 NOTE — Telephone Encounter (Signed)
Pt returned call and has decided that she wants to complete the Urea (H. Pylori) breath test instead of the stool test. She is aware that I will D/C stool order and will enter/fax order to [facility] for the breath test. Pt is aware that she will go to [facility] on [address] at her convenience to complete the breath test. She has been advised that she can walk-in or make an appt online. Pt knows that she still needs to submit the remainder of stool tests. Pt verbalized understanding and had no concerns at the end of the call.  Urea breath test order in epic and has been faxed to [facility].

## 2021-04-17 NOTE — Telephone Encounter (Signed)
Returned call to patient. She states that over the weekend she had a good sample but since the lab is not open on the weekend she did not collect it because the sample would not have been viable. She states that she is trying to collect the samples but just doesn't know when her bowels are going to move. I told pt that it is no rush and she can get the samples back whenever she can. Pt had questions about the H. Pylori breath test, she was interested in possibly doing this instead of the H. Pylori stool test since it is more accurate. Pt is going to call [facility] to discuss pricing and she will call me back to let me know which test she has decided to complete.

## 2021-04-17 NOTE — Telephone Encounter (Signed)
Patient called and stated that she has been waiting for a " watery stool" to give a sample with and has not been successful. Patient is seeking advise if there is anything she can do to help her with that. Please advise.

## 2021-04-25 ENCOUNTER — Other Ambulatory Visit: Payer: Self-pay | Admitting: Physician Assistant

## 2021-04-26 ENCOUNTER — Encounter: Payer: Self-pay | Admitting: Gastroenterology

## 2021-04-26 LAB — H. PYLORI BREATH TEST: H. pylori Breath Test: NOT DETECTED

## 2021-04-26 NOTE — Telephone Encounter (Signed)
Please let patient know that she is exactly right, unfortunately we will not have any more answers until after her upcoming procedures.  This is the reason we are doing them, to try and figure out what is causing her symptoms.  ?Thanks, JLL ? ?Called and spoke with patient regarding information from [employer]. PT states that she thought the H. Pylori breath test covered all of the test ordered. It old her that 5 stool test were ordered and would test for other causes of her symptoms. Pt stated that she misunderstood and will complete the remainder of the stool test as soon as she can. Pt is aware of her f/u appt with Dr. Havery Moros. Pt verbalized understanding and had no concerns at the end of the call. ?

## 2021-05-03 ENCOUNTER — Other Ambulatory Visit: Payer: Self-pay

## 2021-05-03 DIAGNOSIS — R1013 Epigastric pain: Secondary | ICD-10-CM

## 2021-05-03 DIAGNOSIS — R1084 Generalized abdominal pain: Secondary | ICD-10-CM

## 2021-05-03 DIAGNOSIS — R197 Diarrhea, unspecified: Secondary | ICD-10-CM

## 2021-05-03 DIAGNOSIS — R159 Full incontinence of feces: Secondary | ICD-10-CM

## 2021-05-06 LAB — GI PROFILE, STOOL, PCR
Adenovirus F 40/41: NOT DETECTED
Astrovirus: NOT DETECTED
C difficile toxin A/B: NOT DETECTED
Campylobacter: NOT DETECTED
Cryptosporidium: DETECTED — AB
Cyclospora cayetanensis: NOT DETECTED
Entamoeba histolytica: NOT DETECTED
Enteroaggregative E coli: NOT DETECTED
Enteropathogenic E coli: NOT DETECTED
Enterotoxigenic E coli: NOT DETECTED
Giardia lamblia: NOT DETECTED
Norovirus GI/GII: NOT DETECTED
Plesiomonas shigelloides: NOT DETECTED
Rotavirus A: NOT DETECTED
Salmonella: NOT DETECTED
Sapovirus: NOT DETECTED
Shiga-toxin-producing E coli: NOT DETECTED
Shigella/Enteroinvasive E coli: NOT DETECTED
Vibrio cholerae: NOT DETECTED
Vibrio: NOT DETECTED
Yersinia enterocolitica: NOT DETECTED

## 2021-05-07 LAB — CALPROTECTIN, FECAL: Calprotectin, Fecal: 18 ug/g (ref 0–120)

## 2021-05-08 ENCOUNTER — Other Ambulatory Visit: Payer: Self-pay

## 2021-05-08 DIAGNOSIS — R1084 Generalized abdominal pain: Secondary | ICD-10-CM

## 2021-05-08 DIAGNOSIS — R194 Change in bowel habit: Secondary | ICD-10-CM

## 2021-05-08 DIAGNOSIS — A072 Cryptosporidiosis: Secondary | ICD-10-CM

## 2021-05-08 MED ORDER — NITAZOXANIDE 500 MG PO TABS: 500.0000 mg | ORAL_TABLET | Freq: Two times a day (BID) | ORAL | 0 refills | Status: AC

## 2021-05-08 NOTE — Telephone Encounter (Signed)
This has been addressed, see 05/03/21 stool study result note. ?

## 2021-05-10 LAB — OVA AND PARASITE EXAMINATION
CONCENTRATE RESULT:: NONE SEEN
MICRO NUMBER:: 13134172
SPECIMEN QUALITY:: ADEQUATE
TRICHROME RESULT:: NONE SEEN

## 2021-05-12 LAB — PANCREATIC ELASTASE, FECAL: Pancreatic Elastase-1, Stool: 449 mcg/g

## 2021-06-06 ENCOUNTER — Ambulatory Visit: Payer: Self-pay | Admitting: Gastroenterology

## 2021-06-08 ENCOUNTER — Telehealth: Payer: Self-pay | Admitting: Gastroenterology

## 2021-06-08 NOTE — Telephone Encounter (Signed)
Patient sent a MyChart message this morning with the same information. See 04/26/21 patient message for details.  ?

## 2021-06-08 NOTE — Addendum Note (Signed)
Addended by: Yevette Edwards on: 06/08/2021 08:11 AM ? ? Modules accepted: Orders ? ?

## 2021-06-08 NOTE — Telephone Encounter (Signed)
Patient called, states she found a pharmacy that has NITAZOXANIDE medication available. [facility]: [address] [place], [place], [place] [address]. Tel: [phone]. Patient would like that script sent there. Patient also has questions about that medication. Patient wants to know if this medication will get rid of the infection completely? Please advise.  ?

## 2021-06-09 NOTE — Addendum Note (Signed)
Addended by: Yevette Edwards on: 06/09/2021 12:46 PM ? ? Modules accepted: Orders ? ?

## 2021-06-09 NOTE — Telephone Encounter (Signed)
Praveen Coia can you help relay the following:  ?I would not use nitazoxanide given the reaction to soy she endorses. However, in cases where it cannot be used, we can try Paromomycin (500 mg three times daily for one week) if she has no contraindications to this, if you can put it through the pharmacy to see if any interactions, etc, and if not, see if she wants to try it? Thanks  ?Dr. Loni Muse  ? ? ?

## 2021-06-17 IMAGING — DX DG CHEST 2V
2 series · 2 of 2 positions shown · non-contrast
Comparison: None.

CLINICAL DATA: Chest pain

EXAM:
CHEST - 2 VIEW

[chest pa]
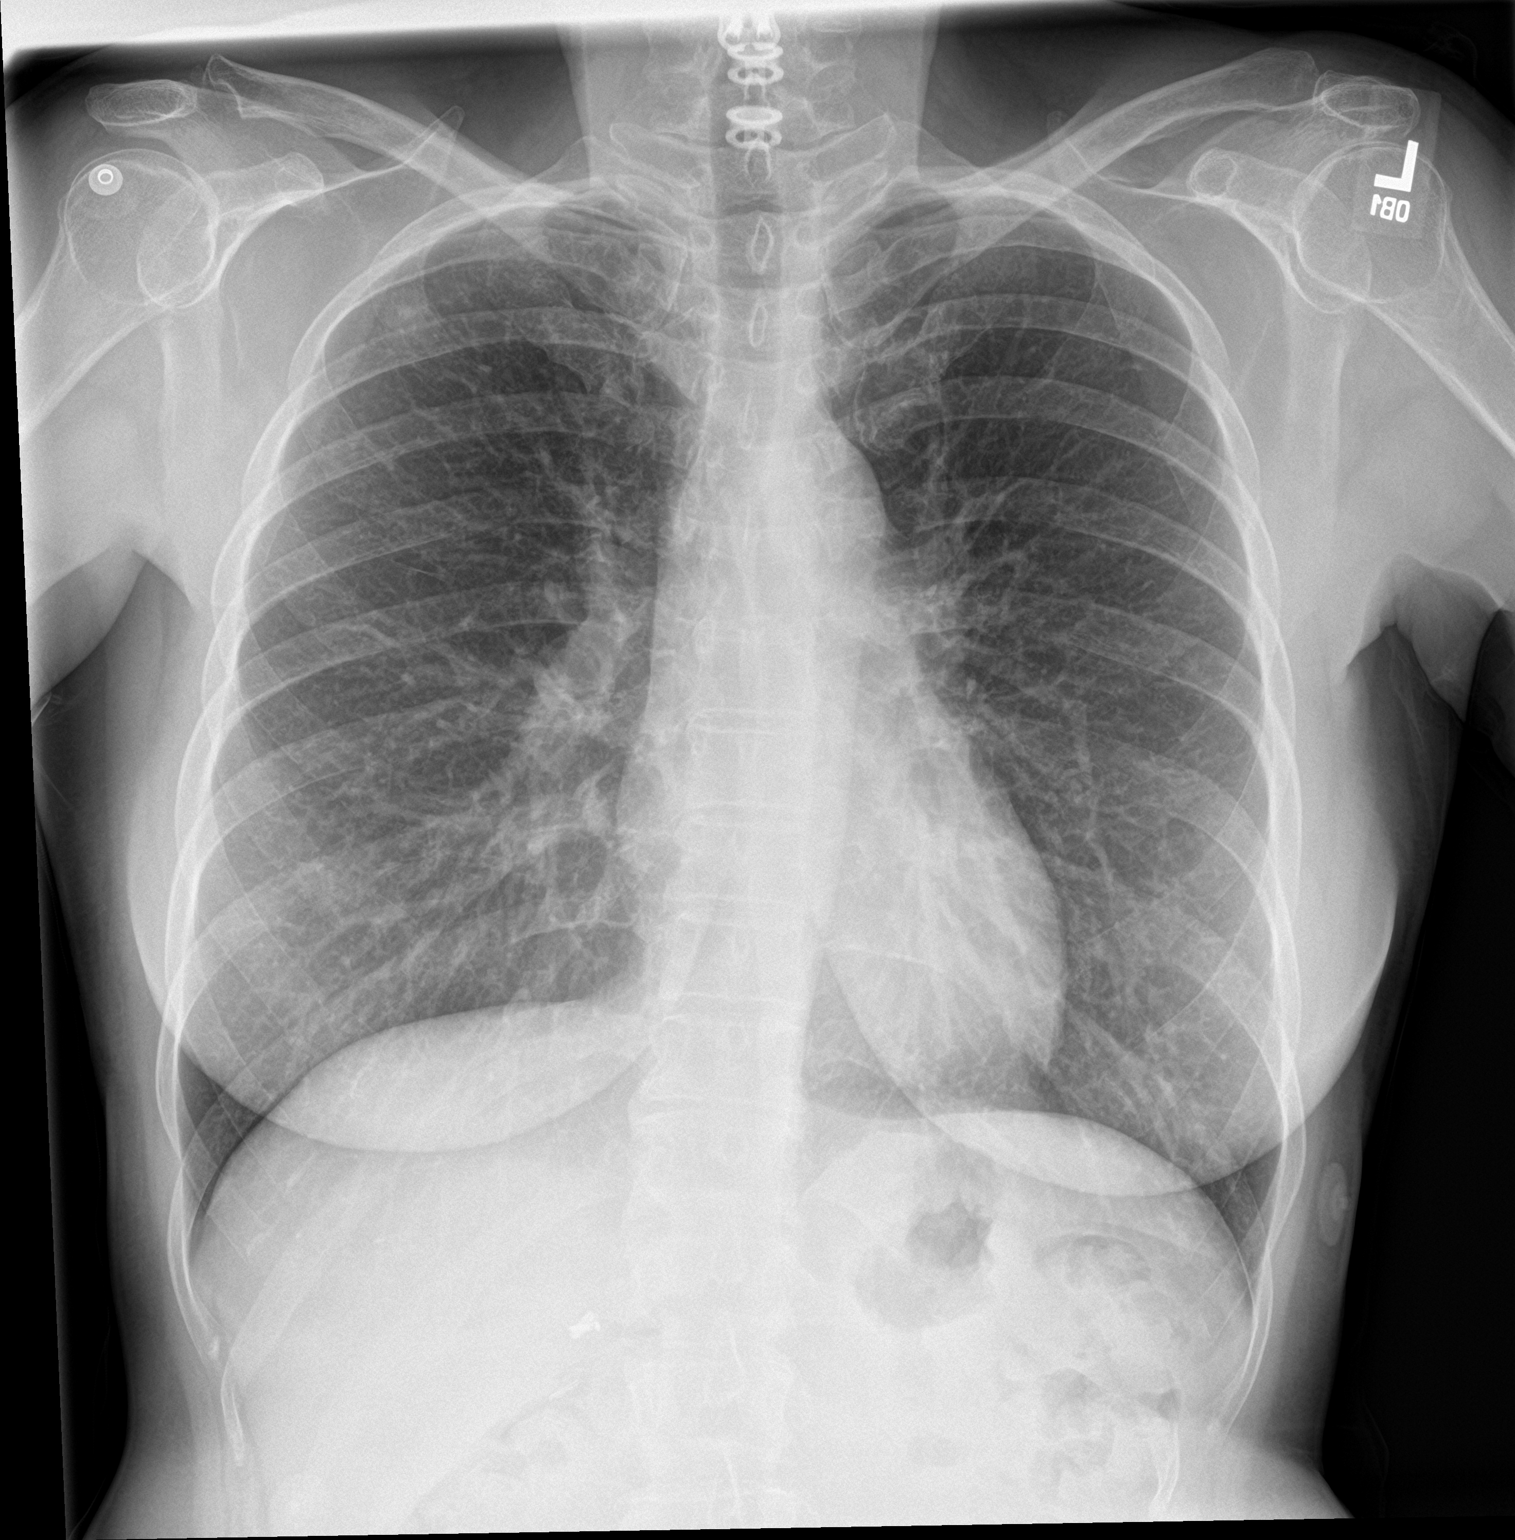

[chest lat]
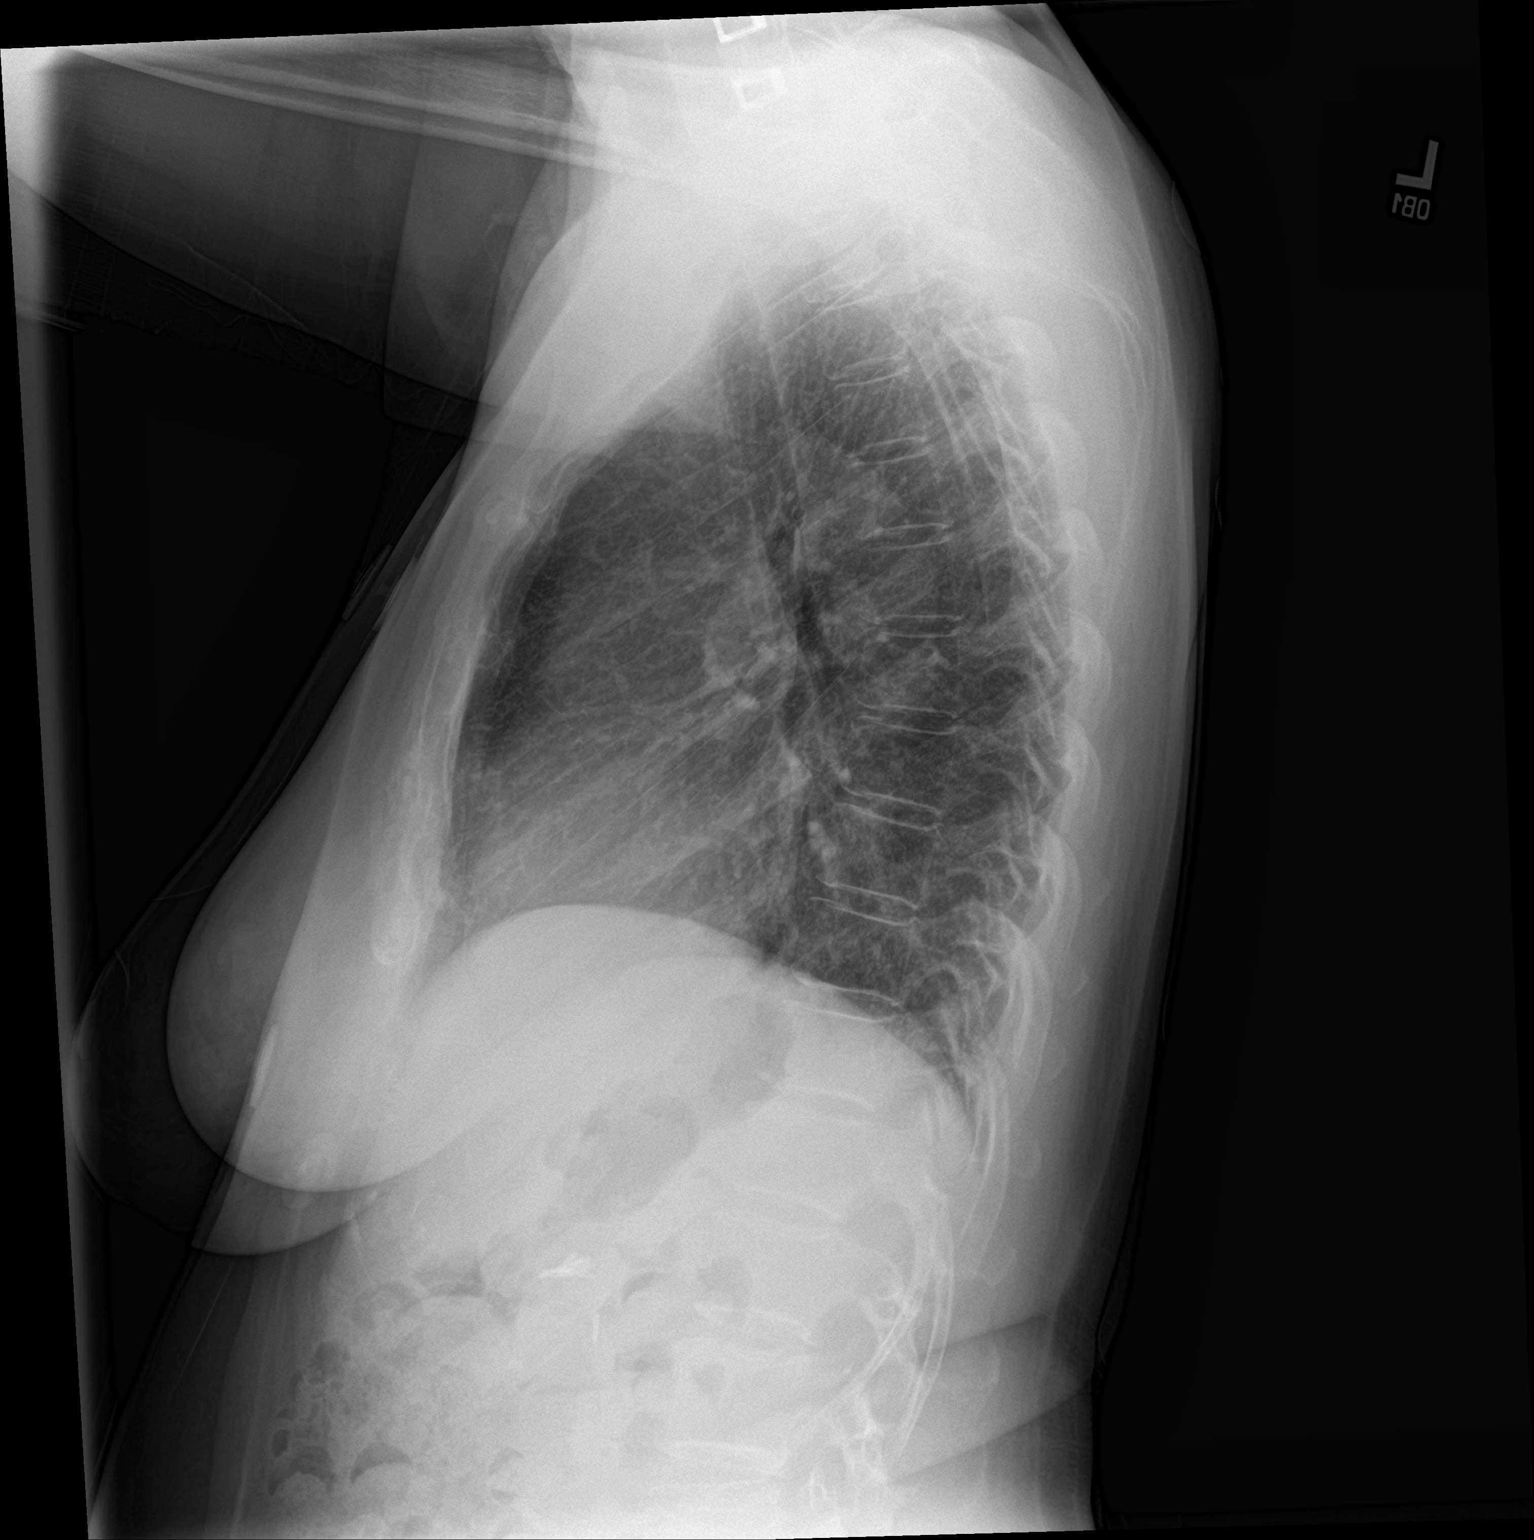

[2 of 2 positions shown; findings below may reference images not displayed]

FINDINGS: The heart size and mediastinal contours are within normal limits.
Both lungs are clear. No pleural effusion. No pneumothorax.
Partially imaged cervical spine anterior fusion. Cholecystectomy
clips.
IMPRESSION: No acute process in the chest.

## 2021-07-11 ENCOUNTER — Encounter: Payer: Self-pay | Admitting: Gastroenterology

## 2021-07-11 ENCOUNTER — Ambulatory Visit: Payer: Self-pay | Admitting: Gastroenterology

## 2021-07-11 VITALS — BP 104/66 | HR 77 | Ht 66.0 in | Wt 148.0 lb

## 2021-07-11 DIAGNOSIS — K219 Gastro-esophageal reflux disease without esophagitis: Secondary | ICD-10-CM

## 2021-07-11 DIAGNOSIS — K9049 Malabsorption due to intolerance, not elsewhere classified: Secondary | ICD-10-CM

## 2021-07-11 DIAGNOSIS — R1310 Dysphagia, unspecified: Secondary | ICD-10-CM

## 2021-07-11 DIAGNOSIS — R140 Abdominal distension (gaseous): Secondary | ICD-10-CM

## 2021-07-11 DIAGNOSIS — K529 Noninfective gastroenteritis and colitis, unspecified: Secondary | ICD-10-CM

## 2021-07-11 DIAGNOSIS — K625 Hemorrhage of anus and rectum: Secondary | ICD-10-CM

## 2021-07-11 MED ORDER — NA SULFATE-K SULFATE-MG SULF 17.5-3.13-1.6 GM/177ML PO SOLN: 1.0000 | Freq: Once | ORAL | 0 refills | Status: AC

## 2021-07-11 NOTE — Patient Instructions (Addendum)
If you are age 62 or older, your body mass index should be between 23-30. Your Body mass index is 23.89 kg/m. If this is out of the aforementioned range listed, please consider follow up with your Primary Care Provider.  If you are age 74 or younger, your body mass index should be between 19-25. Your Body mass index is 23.89 kg/m. If this is out of the aformentioned range listed, please consider follow up with your Primary Care Provider.   ________________________________________________________  The [place] [facility] providers would like to encourage you to use [facility] to communicate with providers for non-urgent requests or questions.  Due to long hold times on the telephone, sending your provider a message by [facility] may be a faster and more efficient way to get a response.  Please allow 48 business hours for a response.  Please remember that this is for non-urgent requests.  _______________________________________________________  Deborah Callahan have been scheduled for an endoscopy and colonoscopy. Please follow the written instructions given to you at your visit today. Please pick up your prep supplies at the pharmacy within the next 1-3 days. If you use inhalers (even only as needed), please bring them with you on the day of your procedure.   We will put you on the wait list for a sooner procedure.  Thank you for entrusting me with your care and for choosing [facility], Dr. Russell Cellar

## 2021-07-11 NOTE — Progress Notes (Signed)
HPI :  61 year old female here for a follow-up visit today for a myriad of gastrointestinal complaints.  This is my first time meeting the patient and her husband, she has been seen by Ellouise Newer on a few occasions in recent months.  Recall she has had ongoing issues with her stomach and her bowels.  She endorses a history of reflux, as well as dysphagia at times.  Along with occasional nausea and vomiting.  She has a lot of bloating in her abdomen and discomfort in her abdomen with this..  She has had intermittent loose stools with urgency that have bothered her.  She is also had rectal bleeding in the setting which has been thought to be due to hemorrhoids or fissures in the past.  Her last colonoscopy was in 2007 which was normal.  She has never had an EGD.  She was initially scheduled for an EGD and colonoscopy to evaluate these complaints but are essentially self-pay and cost was prohibitive here.  They found out that her procedures were cheaper to have done at [place] which is within the [facility].  The patient's husband says he negotiated with [place] to have the same rate here as they would have to pay in [place].  They are now ready to proceed with upper endoscopy and colonoscopy for the symptoms.  The patient reports a history of multiple food allergies in the past and food intolerances.  She states soy, milk, gluten, lentils, onions, garlic etc. all make her to have a variety of reactions is related to hives, diarrhea, swelling.  She has seen an allergist many years ago but not recently.  She has been on a strict gluten-free diet.  States he had blood testing for celiac disease years ago and was told she had an intolerance and not celiac disease but no records of that on file.  She has been on omeprazole in the past for reflux but had multiple intolerances, states she had blisters all over her body, fatigue, headache, neuropathy, did not like the way she felt on it and  stopped it.  They report about 4 to 6 weeks ago she eliminated all oats from her diet.  She states this change has made a dramatic difference and she is feeling much better over the past month or so.  Her symptoms have significantly improved and not nearly as bothersome as they have been.  She reports drinking a concoction of a skinned apple, avocado, cinnamon, green tea, turmeric, and sunflower butter every morning.  She states she tolerates this well.  She has also eliminated eggs.  She feels that with dietary therapy she is significantly improved.  Husband also reports she has stressed that she carries with her anxiety which feels can also influence her symptoms.  Her lab work-up has shown normal CBC and thyroid.  She had a negative H. pylori breath test.  She had a negative fecal calprotectin and pancreatic fecal elastase as well as ova and parasites.  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  We had recommended treating her with nitazoxanide for a few days.  However due to her soy allergy she could not have nitazoxanide.  In this setting I recommended Paromomycin 539m TID for one week.  She states she never took this.  We had discussed that it was unclear if Cryptosporidium was an innocent bystander or even causing symptoms as she is immunocompetent.    H pylori breath test negative 04/25/21  Fecal calprotectin 18 Pancreatic fecal elastase 449 GI pathogen panel positive for cryptosporidium 05/03/21 Ova/parasites negative  Past Medical History:  Diagnosis Date   Arthritis    degenerative cerv. spine , beginning signs of carpal tunnel syndrome- L hand     Complication of anesthesia    Cystocele    Environmental and seasonal allergies    Family history of anesthesia complication    N&V- mother & sister   GERD (gastroesophageal reflux disease)    DIET CONTROLLED   Headache(784.0)    migraines    History of hiatal hernia    History of kidney stones     Hypothyroidism    IBS (irritable bowel syndrome)    IC (interstitial cystitis)    Mild intermittent asthma    Pelvic relaxation    Pneumonia    PONV (postoperative nausea and vomiting)    Wears glasses      Past Surgical History:  Procedure Laterality Date   ABDOMINAL HYSTERECTOMY     partial hysterectomy   ANTERIOR AND POSTERIOR REPAIR  09/23/2014   Procedure: ANTERIOR (CYSTOCELE) AND POSTERIOR REPAIR (RECTOCELE);  Surgeon: Arvella Nigh, MD;  Location: [facility];  Service: Gynecology;;   ANTERIOR CERVICAL DECOMP/DISCECTOMY FUSION N/A 05/08/2012   Procedure: ACDF C5-7  ANTERIOR CERVICAL DECOMPRESSION/DISCECTOMY FUSION 2 LEVELS;  Surgeon: Melina Schools, MD;  Location: [facility];  Service: Orthopedics;  Laterality: N/A;   ANTERIOR CERVICAL DECOMP/DISCECTOMY FUSION N/A 02/09/2019   Procedure: ANTERIOR CERVICAL DECOMPRESSION FUSION CERVICAL 3-4, CERVICAL 4-5 WITH INSTRUMENTATION AND ALLOGRAFT;  Surgeon: Phylliss Bob, MD;  Location: [facility];  Service: Orthopedics;  Laterality: N/A;   COLONOSCOPY  10/19/2005   D & C HYSTEROSCOPE/ RESECTION POLYP/  POSTERIOR AND ENTEROOCELE REPAIRS/  CARDINAL UTEROSACRAL COLPOSUSPENSION /  PERINEAL BODY REPAIR  04/22/2003   FRACTURE SURGERY Right 2005   Right shoulder with Dr. Theda Sers at [place]  11/02/2004   w/  Release posterior vaginal scar   LAPAROSCOPIC CHOLECYSTECTOMY  10/11/2001   TOOTH EXTRACTION     #31   TUBAL LIGATION  1988   Family History  Problem Relation Age of Onset   Cancer Mother        breast   Arthritis/Rheumatoid Mother    Diabetes Mother    Cancer Sister        breast   Cancer Maternal Grandmother        breast   Diabetes Paternal Grandfather    Social History   Tobacco Use   Smoking status: Never   Smokeless tobacco: Never  Vaping Use   Vaping Use: Never used  Substance Use Topics   Alcohol use: Yes    Comment: OCCASIONAL WINE   Drug use: No    Current Outpatient Medications  Medication Sig Dispense Refill   acetaminophen (TYLENOL) 650 MG CR tablet Take 1,300 mg by mouth every 8 (eight) hours as needed for pain.      Ascorbic Acid (VITAMIN C PO) Take 1 tablet by mouth daily.     b complex vitamins tablet Take 1 tablet by mouth daily.     CALCIUM PO Take 1 tablet by mouth daily.     Homeopathic Products (ZICAM ALLERGY RELIEF) GEL Place 1 application into the nose at bedtime.     levothyroxine (SYNTHROID) 100 MCG tablet Take 1 tablet (100 mcg total) by mouth daily. 30 tablet 6   MELATONIN PO Take 1 tablet by mouth at bedtime as needed (sleep).  Menaquinone-7 (VITAMIN K2 PO) Take 1 capsule by mouth daily.     Multiple Vitamin (MULTIVITAMIN) capsule Take 1 capsule by mouth daily.     Turmeric (QC TUMERIC COMPLEX PO) Take by mouth.     No current facility-administered medications for this visit.   Allergies  Allergen Reactions   Codeine Anaphylaxis, Hives and Itching   Oxycodone-Acetaminophen Hives, Shortness Of Breath, Itching and Swelling   Wheat Bran Anaphylaxis, Hives, Diarrhea and Swelling    AND  GLUTEN CONTAINING PRODUCTS   Buspar [Buspirone] Hives and Itching    Bleeding in scalp   Carafate [Sucralfate] Hives, Itching and Nausea And Vomiting    Bleeding in scalp, headaches   Eggs Or Egg-Derived Products Hives, Diarrhea and Nausea And Vomiting   Gluten Meal     Other reaction(s): Unknown   Lactose Intolerance (Gi) Hives, Diarrhea and Nausea And Vomiting    All milk products  (rice milk is ok) (Goat milk)   Latex Itching and Other (See Comments)    Skin irritation/ reddness   Lentil Diarrhea and Swelling    Bowel irritation    Omeprazole Other (See Comments)    Multiple intolerances -- decrease in energy, headaches, neuropathy in hands, throat irritation, changes in appetite, muscle aches, itchiness accompanied by widespread tiny blisters over her body and in her mouth   Peanuts [Peanut Oil] Hives, Diarrhea and  Nausea And Vomiting    THIS INCLUDES PEANUTS AND ALMONDS ( TREE NUTS OK)   Pepcid [Famotidine]    Soybean-Containing Drug Products Hives, Nausea And Vomiting and Swelling   Tramadol Hives, Nausea And Vomiting and Swelling   Corn-Containing Products Diarrhea     Review of Systems: All systems reviewed and negative except where noted in HPI.   Lab Results  Component Value Date   WBC 4.7 12/07/2020   HGB 14.5 12/07/2020   HCT 43.4 12/07/2020   MCV 93.9 12/07/2020   PLT 227.0 12/07/2020    Lab Results  Component Value Date   CREATININE 0.69 12/07/2020   BUN 16 12/07/2020   NA 139 12/07/2020   K 4.0 12/07/2020   CL 104 12/07/2020   CO2 27 12/07/2020    Lab Results  Component Value Date   ALT 25 02/06/2019   AST 23 02/06/2019   ALKPHOS 93 02/06/2019   BILITOT 0.7 02/06/2019     Physical Exam: BP 104/66   Pulse 77   Ht 5' 6"  (1.676 m)   Wt 148 lb (67.1 kg)   SpO2 97%   BMI 23.89 kg/m  Constitutional: Pleasant,well-developed, female in no acute distress. Neurological: Alert and oriented to person place and time.Marland Kitchen Psychiatric: Normal mood and affect. Behavior is normal.   ASSESSMENT AND PLAN: 61 year old female here for reassessment of the following:  Chronic diarrhea Bloating Food intolerance Rectal bleeding GERD Dysphagia  Detailed history is as above.  Her work-up with stool studies showed positive for Cryptosporidium although unclear if that is just a red herring given she is immunocompetent and usually does not warrant any therapy.  As above she did not receive therapy for this due to allergy to soy.  We have discussed its possible she could have underlying celiac disease, she needs to be tested for that.  She is very overdue for colonoscopy and in light of her myriad of symptoms, EGD and colonoscopy is recommended to further evaluate.  I discussed risk benefits of endoscopy and anesthesia with them and they want to proceed.  They are ready to proceed with  it at this point time at the [facility], we will get her scheduled for that.  We will plan on dilating any strictures we see.  We will plan on testing her for celiac disease with biopsies of the small intestine initially.  If this is normal, we may consider genetic testing for celiac disease given she is on gluten-free diet at baseline.  She has made changes to her diet since we have last seen her and essentially at eliminating oats and eggs and she feels that she has been doing much better on this regimen in recent weeks, this is very good news.  I do think she would benefit from seeing an allergist, they want to hold off on that for now while we await the endoscopic evaluation.  I do think her rectal bleeding is likely due to hemorrhoids in the setting of her diarrhea, but will await the results of her exam.  Further recommendations pending the results and her course.  Plan: - schedule EGD and colonoscopy in the [place] - consider celiac genetic testing pending EGD results as she is on gluten free diet at baseline - referral to allergy clinic may be helpful, they wish to await EGD and colonoscopy first - continue dietary measures - avoiding oats / eggs and other known food allergens  Jolly Mango, MD [facility]

## 2021-07-26 ENCOUNTER — Telehealth: Payer: Self-pay | Admitting: Gastroenterology

## 2021-07-26 NOTE — Telephone Encounter (Signed)
John,  Do you mind calling this patient? She is self pay. Her EGD/colon is scheduled for 08/08/21. She is wanting to know the estimate on how much the anesthesia will cost. Thank you.

## 2021-08-01 ENCOUNTER — Telehealth: Payer: Self-pay | Admitting: Gastroenterology

## 2021-08-01 NOTE — Telephone Encounter (Signed)
Patient has a procedure on 08-08-2021 and would someone to go over prep instructions with her please.

## 2021-08-01 NOTE — Telephone Encounter (Signed)
Returned call to patient. We have reviewed her instructions as previously outlined. Pt had no concerns at the end of the call.

## 2021-08-03 ENCOUNTER — Telehealth: Payer: Self-pay | Admitting: *Deleted

## 2021-08-03 NOTE — Telephone Encounter (Signed)
Dr. Havery Moros,  This pt is scheduled with you on 6/20.  She is a documented difficult intubation and her procedure will need to be done at the hospital.    Thanks,  Osvaldo Angst

## 2021-08-03 NOTE — Telephone Encounter (Signed)
  I discussed this case with Deborah Callahan, due to her limited range of motion of her neck she was determined to be a high risk / difficult airway back on her last surgery. Deborah Callahan has called the patient and discussed the situation.  She needs to be cancelled from the [facility] schedule on 6/20.  Unfortunately her case must now be done in the hospital. We currently have a roughly 3 month wait to have her exams done there due to limited access for outpatients right now. She is also self pay and will need to have another estimate given to her of how much this will cost to be done in the hospital setting.   Deborah Callahan can you help with this? It is a bit complicated with no insurance paying out of pocket, not sure who can give her an estimate of how much it would be at the hospital. Please apologize to her for the inconvenience. We will call her to schedule once we have openings come up. It could be sooner than 3 months if we have any cancellations. Thanks

## 2021-08-08 ENCOUNTER — Encounter: Payer: Self-pay | Admitting: Gastroenterology

## 2021-08-09 NOTE — Telephone Encounter (Signed)
Patient notified of information below via MyChart.  Patient has been added to Dr. Doyne Keel hospital wait list.

## 2021-08-29 ENCOUNTER — Telehealth: Payer: Self-pay | Admitting: Gastroenterology

## 2021-08-29 ENCOUNTER — Other Ambulatory Visit: Payer: Self-pay

## 2021-08-29 DIAGNOSIS — R140 Abdominal distension (gaseous): Secondary | ICD-10-CM

## 2021-08-29 DIAGNOSIS — R1310 Dysphagia, unspecified: Secondary | ICD-10-CM

## 2021-08-29 DIAGNOSIS — K529 Noninfective gastroenteritis and colitis, unspecified: Secondary | ICD-10-CM

## 2021-08-29 DIAGNOSIS — K625 Hemorrhage of anus and rectum: Secondary | ICD-10-CM

## 2021-08-29 DIAGNOSIS — K219 Gastro-esophageal reflux disease without esophagitis: Secondary | ICD-10-CM

## 2021-08-29 MED ORDER — ONDANSETRON HCL 4 MG PO TABS: 4.0000 mg | ORAL_TABLET | ORAL | 0 refills | Status: DC | PRN

## 2021-08-29 MED ORDER — DICYCLOMINE HCL 20 MG PO TABS: 20.0000 mg | ORAL_TABLET | Freq: Four times a day (QID) | ORAL | 0 refills | Status: DC | PRN

## 2021-08-29 NOTE — Telephone Encounter (Signed)
Called and spoke with patient regarding Dr. Blanch Media recommendations. Pt is aware that prescriptions for Zofran and Dicyclomine have been sent to her pharmacy on file. Pt stated that she will had a PLENVU sample. Pt would like instructions for PLENVU and Miralax prep. I told pt that I will send both to her MyChart and I will place copies in the mail as well. Pt verbalized understanding and had no concerns at the end of the call.

## 2021-08-29 NOTE — Telephone Encounter (Signed)
Dr. Henrene Pastor as DOD AM of 08/29/21, please see note below and advise. Thanks  Dr. Doyne Keel pt with a hx of chronic diarrhea, rectal bleeding, GERD, dysphagia, food intolerance, and bloating  Returned call to patient. She reports that she is still having BRBPR, pt also reports frequent diarrhea, "almost like water". I told pt that the rectal bleeding could possibly be coming from hemorrhoids. I asked pt if she has tried any Imodium OTC and she has not. I advised pt to try that and see if that helps slow down her stools. I also advised patient to use Preparation H suppositories to help with hemorrhoidal bleeding. Pt reports that her son is a Nutritional therapist and recommended that she ask for a prescription of Zofran since she gets nauseas when she gets abdominal pain and Dicyclomine 20 mg PRN. Pt is aware that Dr. Doyne Keel next available [facility] slot is 10/26/21. Pt would like to be scheduled on this day. I told pt that I will schedule her at [facility] at 12 pm, she knows that she will need to arrive at 10:30 am with a care partner. Pt knows that I will send prep to her pharmacy on file. Pt knows to expect her instructions via MyChart and by mail. Pt has been advised that Dr. Havery Moros is out of the office and we will send message to covering MD. Pt knows that I will contact her with MD's recommendations.   Ambulatory referral to GI in epic.  EGD/colon instructions sent to pt via MyChart and a copy has been mailed.

## 2021-08-29 NOTE — Telephone Encounter (Signed)
Dr. Havery Moros note from Jul 11, 2021 reviewed.  1.  Agree with Imodium and Preparation H 2.  Okay to prescribe Zofran 4 mg; #30; take 1 by mouth every 4 hours as needed nausea; no refills 3.  Okay to prescribe dicyclomine 20 mg daily; #30; take 1 by mouth every 6 hours as needed for abdominal cramping pain; no refills 4.  Dr. Havery Moros can weigh in additionally, if need be, upon his return.  Thanks,  Docia Chuck. Geri Seminole., M.D. [facility] Division of Gastroenterology

## 2021-09-01 NOTE — Telephone Encounter (Signed)
Thanks for [employer], agree with plan. Hopefully she is feeling better.

## 2021-09-07 ENCOUNTER — Telehealth: Payer: Self-pay | Admitting: Gastroenterology

## 2021-09-07 ENCOUNTER — Other Ambulatory Visit: Payer: Self-pay

## 2021-09-07 DIAGNOSIS — K625 Hemorrhage of anus and rectum: Secondary | ICD-10-CM

## 2021-09-07 NOTE — Telephone Encounter (Signed)
Pt made aware of all of Dr. Havery Moros recommendations: Pt states that she is not on any iron, pepto bismol or NSAIDS: Orders for labs placed in Epic  Pt made aware Pt states that she will come in AM to have labs drawn Pt verbalized understanding with all questions answered.

## 2021-09-07 NOTE — Telephone Encounter (Signed)
Sorry to hear this.  Unfortunately her case needs to be done at the hospital due to difficult airway.  September is the first opens but we have. We have to clarify at this point in time if her dark stools are due to bleeding in her bowel somewhere or otherwise due to ingestion.  Can you ask her if she is taking any iron supplement, Pepto-Bismol etc.  If she is taking Pepto-Bismol that can often cause dark stools.  To be safe we can ask her to go to the lab for CBC and BMET to make sure her hemoglobin and BUN levels are okay.  I would ask her to come to the lab as soon as she can to have that done.  Of note I am out of the office tomorrow so if she comes to the lab tomorrow, she will need to call in for the results to be reviewed with her by a staff member.  If she is taking any NSAIDs she should otherwise stop those.  If she is having chest pain, shortness of breath, lightheadedness, dizziness, concern for active bleeding, she may be best served going to the hospital to get this lab work done to make sure she is okay.  If this is a stable issue over the past 2 weeks and otherwise feels well I think okay to wait to go to the lab tomorrow.  Of note, she has no insurance.  She had priced out options for her EGD and colonoscopy at [facility] and at the [facility] but unfortunately her case had to be changed to be done at the hospital due to the airway issue.  She needs to make sure that she understands the pricing may change at the hospital and should call the endoscopy department at the hospital to discuss pricing to make sure she has no surprises for her planned procedures.  Thanks

## 2021-09-07 NOTE — Telephone Encounter (Signed)
Pt states that she has been having ongoing diarrhea on and off associated with Constipation: Pt is scheduled for an Endo/Colon on 10/26/2021 with Dr. Havery Moros: Pt states that she has been having black tarry stools for about two weeks now. Pt states that this is new for her and has not experienced this in the past: Please advise

## 2021-09-07 NOTE — Telephone Encounter (Signed)
Patient called stating she would like a phone call from Dr. Doyne Keel nurse as she has some medical questions she needs to have answered.  She has been experiencing bright red blood in her stools, is having constant diarrhea, and having black, tarry stools as well.  Please call patient and advise.  Thank you.

## 2021-09-08 ENCOUNTER — Other Ambulatory Visit (INDEPENDENT_AMBULATORY_CARE_PROVIDER_SITE_OTHER): Payer: Self-pay

## 2021-09-08 DIAGNOSIS — K625 Hemorrhage of anus and rectum: Secondary | ICD-10-CM

## 2021-09-08 LAB — CBC WITH DIFFERENTIAL/PLATELET
Basophils Absolute: 0 10*3/uL (ref 0.0–0.1)
Basophils Relative: 0.8 % (ref 0.0–3.0)
Eosinophils Absolute: 0.3 10*3/uL (ref 0.0–0.7)
Eosinophils Relative: 7.8 % — ABNORMAL HIGH (ref 0.0–5.0)
HCT: 41.5 % (ref 36.0–46.0)
Hemoglobin: 14.1 g/dL (ref 12.0–15.0)
Lymphocytes Relative: 30.1 % (ref 12.0–46.0)
Lymphs Abs: 1.2 10*3/uL (ref 0.7–4.0)
MCHC: 33.9 g/dL (ref 30.0–36.0)
MCV: 94.3 fl (ref 78.0–100.0)
Monocytes Absolute: 0.4 10*3/uL (ref 0.1–1.0)
Monocytes Relative: 10 % (ref 3.0–12.0)
Neutro Abs: 2 10*3/uL (ref 1.4–7.7)
Neutrophils Relative %: 51.3 % (ref 43.0–77.0)
Platelets: 218 10*3/uL (ref 150.0–400.0)
RBC: 4.41 Mil/uL (ref 3.87–5.11)
RDW: 12.6 % (ref 11.5–15.5)
WBC: 3.8 10*3/uL — ABNORMAL LOW (ref 4.0–10.5)

## 2021-09-08 LAB — BASIC METABOLIC PANEL
BUN: 11 mg/dL (ref 6–23)
CO2: 28 mEq/L (ref 19–32)
Calcium: 9.6 mg/dL (ref 8.4–10.5)
Chloride: 104 mEq/L (ref 96–112)
Creatinine, Ser: 0.67 mg/dL (ref 0.40–1.20)
GFR: 94.14 mL/min (ref >60.00)
Glucose, Bld: 93 mg/dL (ref 70–99)
Potassium: 3.7 mEq/L (ref 3.5–5.1)
Sodium: 141 mEq/L (ref 135–145)

## 2021-10-12 ENCOUNTER — Telehealth: Payer: Self-pay

## 2021-10-12 NOTE — Telephone Encounter (Signed)
Dr. Havery Moros had a cancellation at [facility] on 8/29 for a double procedure.   I called and spoke with pt to offer her a sooner appt. Pt is able to come for ECL at [facility] on Tuesday, 10/17/21 at 9:30 am. Pt is aware that she will need to arrive at [facility] by 8 am with a care partner. Pt states that she ate beets today. I told her that this is fine and will not affect her having her procedure. Pt is aware that I will send her updated instructions via MyChart. Pt verbalized understanding and had no concerns at the end of the call.

## 2021-10-13 ENCOUNTER — Encounter (HOSPITAL_COMMUNITY): Payer: Self-pay | Admitting: Gastroenterology

## 2021-10-13 NOTE — Progress Notes (Signed)
Attempted to obtain medical history via telephone, unable to reach at this time. Unable to leave voicemail to return pre surgical testing department's phone call,due to mailbox full.  

## 2021-10-17 ENCOUNTER — Ambulatory Visit (HOSPITAL_BASED_OUTPATIENT_CLINIC_OR_DEPARTMENT_OTHER): Payer: Self-pay | Admitting: Certified Registered"

## 2021-10-17 ENCOUNTER — Ambulatory Visit (HOSPITAL_COMMUNITY): Payer: Self-pay | Admitting: Certified Registered"

## 2021-10-17 ENCOUNTER — Ambulatory Visit (HOSPITAL_COMMUNITY)
Admission: RE | Admit: 2021-10-17 | Discharge: 2021-10-17 | Disposition: A | Discharge status: Home / Self Care | Payer: Self-pay | Attending: Gastroenterology | Admitting: Gastroenterology

## 2021-10-17 ENCOUNTER — Other Ambulatory Visit: Payer: Self-pay

## 2021-10-17 ENCOUNTER — Encounter (HOSPITAL_COMMUNITY): Payer: Self-pay | Admitting: Gastroenterology

## 2021-10-17 ENCOUNTER — Encounter (HOSPITAL_COMMUNITY)
Admission: RE | Disposition: A | Discharge status: Home / Self Care | Payer: Self-pay | Source: Home / Self Care | Attending: Gastroenterology

## 2021-10-17 DIAGNOSIS — D120 Benign neoplasm of cecum: Secondary | ICD-10-CM | POA: Insufficient documentation

## 2021-10-17 DIAGNOSIS — K625 Hemorrhage of anus and rectum: Secondary | ICD-10-CM

## 2021-10-17 DIAGNOSIS — K648 Other hemorrhoids: Secondary | ICD-10-CM

## 2021-10-17 DIAGNOSIS — D123 Benign neoplasm of transverse colon: Secondary | ICD-10-CM | POA: Insufficient documentation

## 2021-10-17 DIAGNOSIS — E039 Hypothyroidism, unspecified: Secondary | ICD-10-CM

## 2021-10-17 DIAGNOSIS — Q438 Other specified congenital malformations of intestine: Secondary | ICD-10-CM

## 2021-10-17 DIAGNOSIS — K635 Polyp of colon: Secondary | ICD-10-CM

## 2021-10-17 DIAGNOSIS — R1310 Dysphagia, unspecified: Secondary | ICD-10-CM | POA: Insufficient documentation

## 2021-10-17 DIAGNOSIS — K529 Noninfective gastroenteritis and colitis, unspecified: Secondary | ICD-10-CM | POA: Insufficient documentation

## 2021-10-17 DIAGNOSIS — D122 Benign neoplasm of ascending colon: Secondary | ICD-10-CM | POA: Insufficient documentation

## 2021-10-17 DIAGNOSIS — K2100 Gastro-esophageal reflux disease with esophagitis, without bleeding: Secondary | ICD-10-CM | POA: Insufficient documentation

## 2021-10-17 DIAGNOSIS — K319 Disease of stomach and duodenum, unspecified: Secondary | ICD-10-CM | POA: Insufficient documentation

## 2021-10-17 DIAGNOSIS — R140 Abdominal distension (gaseous): Secondary | ICD-10-CM

## 2021-10-17 DIAGNOSIS — K449 Diaphragmatic hernia without obstruction or gangrene: Secondary | ICD-10-CM

## 2021-10-17 DIAGNOSIS — D126 Benign neoplasm of colon, unspecified: Secondary | ICD-10-CM

## 2021-10-17 DIAGNOSIS — K219 Gastro-esophageal reflux disease without esophagitis: Secondary | ICD-10-CM

## 2021-10-17 HISTORY — PX: SAVORY DILATION: SHX5439

## 2021-10-17 HISTORY — PX: POLYPECTOMY: SHX5525

## 2021-10-17 HISTORY — PX: COLONOSCOPY WITH PROPOFOL: SHX5780

## 2021-10-17 HISTORY — PX: BIOPSY: SHX5522

## 2021-10-17 HISTORY — PX: ESOPHAGOGASTRODUODENOSCOPY (EGD) WITH PROPOFOL: SHX5813

## 2021-10-17 SURGERY — ESOPHAGOGASTRODUODENOSCOPY (EGD) WITH PROPOFOL
Anesthesia: Monitor Anesthesia Care

## 2021-10-17 MED ORDER — PROPOFOL 500 MG/50ML IV EMUL
INTRAVENOUS | Status: DC | PRN
  Administered 2021-10-17 (×4): 100 mg via INTRAVENOUS
  Administered 2021-10-17: 90 ug/kg/min via INTRAVENOUS
  Administered 2021-10-17 (×3): 100 mg via INTRAVENOUS

## 2021-10-17 MED ORDER — PROPOFOL 500 MG/50ML IV EMUL: INTRAVENOUS | Status: DC | PRN

## 2021-10-17 MED ORDER — LACTATED RINGERS IV SOLN
INTRAVENOUS | Status: AC | PRN
  Administered 2021-10-17: 1000 mL via INTRAVENOUS

## 2021-10-17 MED ORDER — PROPOFOL 1000 MG/100ML IV EMUL
INTRAVENOUS | Status: AC
  Filled 2021-10-17: qty 100

## 2021-10-17 MED ORDER — SODIUM CHLORIDE 0.9 % IV SOLN: INTRAVENOUS | Status: DC

## 2021-10-17 MED ORDER — LIDOCAINE HCL (PF) 2 % IJ SOLN
INTRAMUSCULAR | Status: DC | PRN
  Administered 2021-10-17: 100 mg via INTRADERMAL

## 2021-10-17 SURGICAL SUPPLY — 25 items

## 2021-10-17 NOTE — Op Note (Signed)
[facility] Patient Name: Deborah Callahan Procedure Date: 10/17/2021 MRN: [id number] Attending MD: Carlota Raspberry. Havery Moros , MD Date of Birth: May 08, 1960 CSN: [id number] Age: 61 Admit Type: Outpatient Procedure:                Upper GI endoscopy Indications:              Dysphagia, history of gastro-esophageal reflux                            disease, Abdominal bloating, Diarrhea. Multiple                            food allergies / intolerances, intolerant to prior                            trial of omeprazole Providers:                Carlota Raspberry. Havery Moros, MD, Dulcy Fanny, Gloris Ham, Technician Referring MD:              Medicines:                Monitored Anesthesia Care Complications:            No immediate complications. Estimated blood loss:                            Minimal. Estimated Blood Loss:     Estimated blood loss was minimal. Procedure:                Pre-Anesthesia Assessment:                           - Prior to the procedure, a History and Physical                            was performed, and patient medications and                            allergies were reviewed. The patient's tolerance of                            previous anesthesia was also reviewed. The risks                            and benefits of the procedure and the sedation                            options and risks were discussed with the patient.                            All questions were answered, and informed consent                            was obtained. Prior Anticoagulants: The patient  has                            taken no previous anticoagulant or antiplatelet                            agents. ASA Grade Assessment: II - A patient with                            mild systemic disease. After reviewing the risks                            and benefits, the patient was deemed in                            satisfactory condition  to undergo the procedure.                           After obtaining informed consent, the endoscope was                            passed under direct vision. Throughout the                            procedure, the patient's blood pressure, pulse, and                            oxygen saturations were monitored continuously. The                            [id number]-H190 ([id number]) Olympus endoscope was introduced                            through the mouth, and advanced to the second part                            of duodenum. The upper GI endoscopy was                            accomplished without difficulty. The patient                            tolerated the procedure well. Scope In: Scope Out: Findings:      Esophagogastric landmarks were identified: the Z-line was found at 38       cm, the gastroesophageal junction was found at 38 cm and the upper       extent of the gastric folds was found at 39 cm from the incisors.      A 1 cm hiatal hernia was present.      The exam of the esophagus was otherwise normal.      A guidewire was placed and the scope was withdrawn. Empiric dilation was       performed in the entire esophagus with a Savary dilator with mild       resistance at 16 mm. Attempt at empiric  dilation with 60m dilator not       successful due to resistance, patient has limited range of motion of       neck, could not traverse posterior pharynx. Relook endoscopy showed no       mucosal wrents.      Biopsies were taken with a cold forceps in the entire esophagus for       histology.      The entire examined stomach was normal. Biopsies were taken with a cold       forceps for Helicobacter pylori testing.      The examined duodenum was normal. Biopsies for histology were taken with       a cold forceps for evaluation of celiac disease. Impression:               - Esophagogastric landmarks identified.                           - 1 cm hiatal hernia.                           -  Normal esophagus otherwise - empiric dilation                            performed to 113m biopsies taken to rule out EoE.                           - Normal stomach. Biopsied.                           - Normal examined duodenum. Biopsied. Moderate Sedation:      No moderate sedation, case performed with MAC Recommendation:           - Patient has a contact number available for                            emergencies. The signs and symptoms of potential                            delayed complications were discussed with the                            patient. Return to normal activities tomorrow.                            Written discharge instructions were provided to the                            patient.                           - Resume previous diet.                           - Continue present medications.                           - Await pathology results with further  recommendations Procedure Code(s):        --- Professional ---                           [id number], Esophagogastroduodenoscopy, flexible,                            transoral; with insertion of guide wire followed by                            passage of dilator(s) through esophagus over guide                            wire                           43239, 59, Esophagogastroduodenoscopy, flexible,                            transoral; with biopsy, single or multiple Diagnosis Code(s):        --- Professional ---                           K44.9, Diaphragmatic hernia without obstruction or                            gangrene                           R13.10, Dysphagia, unspecified                           K21.9, Gastro-esophageal reflux disease without                            esophagitis                           R14.0, Abdominal distension (gaseous)                           R19.7, Diarrhea, unspecified CPT copyright 2019 American Medical Association. All rights reserved. The codes  documented in this report are preliminary and upon coder review may  be revised to meet current compliance requirements. Remo Lipps P. Havery Moros, MD 10/17/2021 10:33:39 AM This report has been signed electronically. Number of Addenda: 0

## 2021-10-17 NOTE — Anesthesia Preprocedure Evaluation (Addendum)
Anesthesia Evaluation  Patient identified by MRN, date of birth, ID band Patient awake    Reviewed: Allergy & Precautions, NPO status , Patient's Chart, lab work & pertinent test results  History of Anesthesia Complications (+) PONV and history of anesthetic complications  Airway Mallampati: I       Dental no notable dental hx.    Pulmonary    Pulmonary exam normal        Cardiovascular negative cardio ROS Normal cardiovascular exam     Neuro/Psych    GI/Hepatic Neg liver ROS,   Endo/Other  Hypothyroidism   Renal/GU   negative genitourinary   Musculoskeletal   Abdominal Normal abdominal exam  (+)   Peds  Hematology   Anesthesia Other Findings   Reproductive/Obstetrics                             Anesthesia Physical Anesthesia Plan  ASA: 2  Anesthesia Plan: MAC   Post-op Pain Management:    Induction:   PONV Risk Score and Plan: Propofol infusion and TIVA  Airway Management Planned: Natural Airway and Mask  Additional Equipment: None  Intra-op Plan:   Post-operative Plan:   Informed Consent: I have reviewed the patients History and Physical, chart, labs and discussed the procedure including the risks, benefits and alternatives for the proposed anesthesia with the patient or authorized representative who has indicated his/her understanding and acceptance.     Dental advisory given  Plan Discussed with: CRNA  Anesthesia Plan Comments:         Anesthesia Quick Evaluation

## 2021-10-17 NOTE — Discharge Instructions (Signed)
YOU HAD AN ENDOSCOPIC PROCEDURE TODAY: Refer to the procedure report and other information in the discharge instructions given to you for any specific questions about what was found during the examination. If this information does not answer your questions, please call [place] office at [phone] to clarify.  ° °YOU SHOULD EXPECT: Some feelings of bloating in the abdomen. Passage of more gas than usual. Walking can help get rid of the air that was put into your GI tract during the procedure and reduce the bloating. If you had a lower endoscopy (such as a colonoscopy or flexible sigmoidoscopy) you may notice spotting of blood in your stool or on the toilet paper. Some abdominal soreness may be present for a day or two, also. ° °DIET: Your first meal following the procedure should be a light meal and then it is ok to progress to your normal diet. A half-sandwich or bowl of soup is an example of a good first meal. Heavy or fried foods are harder to digest and may make you feel nauseous or bloated. Drink plenty of fluids but you should avoid alcoholic beverages for 24 hours. If you had a esophageal dilation, please see attached instructions for diet.   ° °ACTIVITY: Your care partner should take you home directly after the procedure. You should plan to take it easy, moving slowly for the rest of the day. You can resume normal activity the day after the procedure however YOU SHOULD NOT DRIVE, use power tools, machinery or perform tasks that involve climbing or major physical exertion for 24 hours (because of the sedation medicines used during the test).  ° °SYMPTOMS TO REPORT IMMEDIATELY: °A gastroenterologist can be reached at any hour. Please call [phone]  for any of the following symptoms:  °Following lower endoscopy (colonoscopy, flexible sigmoidoscopy) °Excessive amounts of blood in the stool  °Significant tenderness, worsening of abdominal pains  °Swelling of the abdomen that is new, acute  °Fever of 100° or  higher  °Following upper endoscopy (EGD, EUS, ERCP, esophageal dilation) °Vomiting of blood or coffee ground material  °New, significant abdominal pain  °New, significant chest pain or pain under the shoulder blades  °Painful or persistently difficult swallowing  °New shortness of breath  °Black, tarry-looking or red, bloody stools ° °FOLLOW UP:  °If any biopsies were taken you will be contacted by phone or by letter within the next 1-3 weeks. Call [phone]  if you have not heard about the biopsies in 3 weeks.  °Please also call with any specific questions about appointments or follow up tests. ° °

## 2021-10-17 NOTE — H&P (Signed)
[facility] History and Physical   Primary Care Physician:  Inda Coke, [place]   Reason for Procedure:  Chronic diarrhea, rectal bleeding, bloating, GERD, dysphagia   Plan:   EGD, possible dilation, and colonoscopy      HPI: Deborah Callahan is a 61 y.o. female  here for EGD and colonoscopy to evaluate issues as above. She has a difficult airway for intubation which is why she is at the hospital for her anesthesia care for these exams. Ongoing intermittent diarrhea / rectal bleeding, last colonoscopy a long time ago. Also with ongoing dysphagia and globus. Allergy listed to omeprazole and carafate. Otherwise feels well without any cardiopulmonary symptoms.   I have discussed risks / benefits of anesthesia and endoscopic procedure with Despina Pole and they wish to proceed with the exams as outlined today.    Past Medical History:  Diagnosis Date   Arthritis    degenerative cerv. spine , beginning signs of carpal tunnel syndrome- L hand     Complication of anesthesia    Cystocele    Environmental and seasonal allergies    Family history of anesthesia complication    N&V- mother & sister   GERD (gastroesophageal reflux disease)    DIET CONTROLLED   Headache(784.0)    migraines    History of hiatal hernia    History of kidney stones    Hypothyroidism    IBS (irritable bowel syndrome)    IC (interstitial cystitis)    Mild intermittent asthma    Pelvic relaxation    Pneumonia    PONV (postoperative nausea and vomiting)    Wears glasses     Past Surgical History:  Procedure Laterality Date   ABDOMINAL HYSTERECTOMY     partial hysterectomy   ANTERIOR AND POSTERIOR REPAIR  09/23/2014   Procedure: ANTERIOR (CYSTOCELE) AND POSTERIOR REPAIR (RECTOCELE);  Surgeon: Arvella Nigh, MD;  Location: [facility];  Service: Gynecology;;   ANTERIOR CERVICAL DECOMP/DISCECTOMY FUSION N/A 05/08/2012   Procedure: ACDF C5-7  ANTERIOR  CERVICAL DECOMPRESSION/DISCECTOMY FUSION 2 LEVELS;  Surgeon: Melina Schools, MD;  Location: [place];  Service: Orthopedics;  Laterality: N/A;   ANTERIOR CERVICAL DECOMP/DISCECTOMY FUSION N/A 02/09/2019   Procedure: ANTERIOR CERVICAL DECOMPRESSION FUSION CERVICAL 3-4, CERVICAL 4-5 WITH INSTRUMENTATION AND ALLOGRAFT;  Surgeon: Phylliss Bob, MD;  Location: [place];  Service: Orthopedics;  Laterality: N/A;   COLONOSCOPY  10/19/2005   D & C HYSTEROSCOPE/ RESECTION POLYP/  POSTERIOR AND ENTEROOCELE REPAIRS/  CARDINAL UTEROSACRAL COLPOSUSPENSION /  PERINEAL BODY REPAIR  04/22/2003   FRACTURE SURGERY Right 2005   Right shoulder with Dr. Theda Sers at [place]  11/02/2004   w/  Release posterior vaginal scar   LAPAROSCOPIC CHOLECYSTECTOMY  10/11/2001   TOOTH EXTRACTION     #31   [place]    Prior to Admission medications   Medication Sig Start Date End Date Taking? Authorizing Provider  Cyanocobalamin (VITAMIN B-12) 5000 MCG SUBL Take 5,000 mcg by mouth in the morning.   Yes [provider]  dicyclomine (BENTYL) 20 MG tablet Take 1 tablet (20 mg total) by mouth every 6 (six) hours as needed (abdominal cramping pain). 08/29/21  Yes Irene Shipper, MD  Homeopathic Products [facility] ALLERGY RELIEF) GEL Place 1 application  into the nose every other day. At night (Zicam Cold Remedy Nasal Swabs)   Yes [provider]  levothyroxine (SYNTHROID) 100 MCG tablet Take 1 tablet (100 mcg  total) by mouth daily. 02/02/21  Yes Shamleffer, Melanie Crazier, MD  LYSINE PO Take 1 Scoop by mouth daily.   Yes [provider]  MAGNESIUM CITRATE PO Take 500 mg by mouth daily. 250 mg/tablet   Yes [provider]  MELATONIN PO Take 6 mg by mouth at bedtime as needed (sleep).   Yes [provider]  Menaquinone-7 (VITAMIN K2 PO) Take 1 capsule by mouth daily. Health As It Ought To Be Vitamin K2 Mk-7 147mg   Yes [provider]  Misc Natural Products (IMMUNE FORMULA PO) Take 1 tablet by mouth in the morning. SCanalouImmune Plus   Yes [provider]  Multiple Vitamins-Minerals (ADULT ONE DAILY GUMMIES PO) Take 2 tablets by mouth daily. Vitafusion MultiVites   Yes [provider]  ondansetron (ZOFRAN) 4 MG tablet Take 1 tablet (4 mg total) by mouth every 4 (four) hours as needed for nausea. 08/29/21  Yes PIrene Shipper MD  Polyethyl Glycol-Propyl Glycol (SYSTANE) 0.4-0.3 % SOLN Place 1-2 drops into both eyes 3 (three) times daily as needed (dry/irritated eyes.).   Yes [provider]  Polyethylene Glycol 400 (BLINK TEARS) 0.25 % SOLN Place 1-2 drops into both eyes daily.   Yes [provider]  TURMERIC-GINGER PO Take 2 tablets by mouth daily. Gummies   Yes [provider]    Current Facility-Administered Medications  Medication Dose Route Frequency Provider Last Rate Last Admin   0.9 %  sodium chloride infusion   Intravenous Continuous Jasminne Mealy, SCarlota Raspberry MD       lactated ringers infusion    Continuous PRN AHavery MorosSCarlota Raspberry MD 125 mL/hr at 10/17/21 0854 1,000 mL at 10/17/21 0854    Allergies as of 08/29/2021 - Review Complete 07/11/2021  Allergen Reaction Noted   Codeine Anaphylaxis, Hives, and Itching 08/13/2011   Oxycodone-acetaminophen Hives, Shortness Of Breath, Itching, and Swelling 06/07/2020   Wheat bran Anaphylaxis, Hives, Diarrhea, and Swelling 04/29/2012   Buspar [buspirone] Hives and Itching 04/11/2021   Carafate [sucralfate] Hives, Itching, and Nausea And Vomiting 04/11/2021   Eggs or egg-derived products Hives, Diarrhea, and Nausea And Vomiting 09/21/2014   Gluten meal  05/15/2020   Lactose intolerance (gi) Hives, Diarrhea, and Nausea And Vomiting 09/20/2014   Latex Itching and Other (See Comments) 04/29/2012   Lentil Diarrhea and Swelling 02/05/2019   Omeprazole Other (See Comments) 03/10/2021   Peanuts [peanut oil] Hives,  Diarrhea, and Nausea And Vomiting 04/29/2012   Pepcid [famotidine]  04/11/2021   Soybean-containing drug products Hives, Nausea And Vomiting, and Swelling 04/29/2012   Tramadol Hives, Nausea And Vomiting, and Swelling 04/29/2012   Corn-containing products Diarrhea 09/23/2014    Family History  Problem Relation Age of Onset   Cancer Mother        breast   Arthritis/Rheumatoid Mother    Diabetes Mother    Cancer Sister        breast   Cancer Maternal Grandmother        breast   Diabetes Paternal Grandfather     Social History   Socioeconomic History   Marital status: Married    Spouse name: CHarrie Jeans  Number of children: 2   Years of education: 113  Highest education level: Not on file  Occupational History    Comment: painter/artist  Tobacco Use   Smoking status: Never   Smokeless tobacco: Never  Vaping Use   Vaping Use: Never used  Substance and Sexual Activity   Alcohol use:  Yes    Comment: OCCASIONAL WINE   Drug use: No   Sexual activity: Not on file  Other Topics Concern   Not on file  Social History Narrative   Lives at home with husband   Retired Statistician   Two sons -- one is 46 and 31 (2022)   One son is in [place]   One son is in [place]   Five grandchildren   Social Determinants of Radio broadcast assistant Strain: Not on file  Food Insecurity: Not on file  Transportation Needs: Not on file  Physical Activity: Not on file  Stress: Not on file  Social Connections: Not on file  Intimate Partner Violence: Not on file    Review of Systems: All other review of systems negative except as mentioned in the HPI.  Physical Exam: Vital signs BP (!) 175/64   Pulse 71   Temp 97.8 F (36.6 C) (Temporal)   Resp 19   Ht '5\' 6"'$  (1.676 m)   Wt 65.3 kg   SpO2 100%   BMI 23.24 kg/m   General:   Alert,  Well-developed, pleasant and cooperative in NAD Lungs:  Clear throughout to auscultation.   Heart:  Regular rate and rhythm Abdomen:   Soft, nontender and nondistended.   Neuro/Psych:  Alert and cooperative. Normal mood and affect. A and O x 3  Jolly Mango, MD [facility]

## 2021-10-17 NOTE — Op Note (Signed)
[facility] Patient Name: Deborah Callahan Procedure Date: 10/17/2021 MRN: [id number] Attending MD: Carlota Raspberry. Havery Moros , MD Date of Birth: 07-Mar-1960 CSN: [id number] Age: 61 Admit Type: Outpatient Procedure:                Colonoscopy Indications:              Chronic diarrhea, Rectal bleeding Providers:                Remo Lipps P. Havery Moros, MD, Dulcy Fanny, Gloris Ham, Technician Referring MD:              Medicines:                Monitored Anesthesia Care Complications:            No immediate complications. Estimated blood loss:                            Minimal. Estimated Blood Loss:     Estimated blood loss was minimal. Procedure:                Pre-Anesthesia Assessment:                           - Prior to the procedure, a History and Physical                            was performed, and patient medications and                            allergies were reviewed. The patient's tolerance of                            previous anesthesia was also reviewed. The risks                            and benefits of the procedure and the sedation                            options and risks were discussed with the patient.                            All questions were answered, and informed consent                            was obtained. Prior Anticoagulants: The patient has                            taken no previous anticoagulant or antiplatelet                            agents. ASA Grade Assessment: II - A patient with                            mild systemic  disease. After reviewing the risks                            and benefits, the patient was deemed in                            satisfactory condition to undergo the procedure.                           After obtaining informed consent, the colonoscope                            was passed under direct vision. Throughout the                            procedure, the  patient's blood pressure, pulse, and                            oxygen saturations were monitored continuously. The                            PCF-HQ190L ([id number] Olympus colonoscope was                            introduced through the anus and advanced to the the                            terminal ileum, with identification of the                            appendiceal orifice and IC valve. The colonoscopy                            was technically difficult and complex due to a                            tortuous colon. The patient tolerated the procedure                            well. The quality of the bowel preparation was                            good. The terminal ileum, ileocecal valve,                            appendiceal orifice, and rectum were photographed. Scope In: 9:52:32 AM Scope Out: 10:21:46 AM Scope Withdrawal Time: 0 hours 22 minutes 31 seconds  Total Procedure Duration: 0 hours 29 minutes 14 seconds  Findings:      The perianal and digital rectal examinations were normal.      The terminal ileum appeared normal.      A diminutive polyp was found in the cecum. The polyp was sessile. The       polyp was removed with a cold biopsy forceps. Resection and retrieval  were complete.      A diminutive polyp was found in the ascending colon. The polyp was       sessile. The polyp was removed with a cold biopsy forceps. Resection and       retrieval were complete.      Two sessile polyps were found in the ascending colon. The polyps were 3       to 5 mm in size. These polyps were removed with a cold snare. Resection       and retrieval were complete.      A 4 mm polyp was found in the transverse colon. The polyp was sessile.       The polyp was removed with a cold snare. Resection and retrieval were       complete.      The colon was quite tortuous.      Internal hemorrhoids were found during retroflexion. The hemorrhoids       were small.      The exam was  otherwise without abnormality.      Biopsies for histology were taken with a cold forceps from the right       colon, left colon and transverse colon for evaluation of microscopic       colitis. Impression:               - The examined portion of the ileum was normal.                           - One diminutive polyp in the cecum, removed with a                            cold biopsy forceps. Resected and retrieved.                           - One diminutive polyp in the ascending colon,                            removed with a cold biopsy forceps. Resected and                            retrieved.                           - Two 3 to 5 mm polyps in the ascending colon,                            removed with a cold snare. Resected and retrieved.                           - One 4 mm polyp in the transverse colon, removed                            with a cold snare. Resected and retrieved.                           - Tortuous colon.                           -  Internal hemorrhoids.                           - The examination was otherwise normal.                           - Biopsies were taken with a cold forceps from the                            right colon, left colon and transverse colon for                            evaluation of microscopic colitis. Moderate Sedation:      No moderate sedation, case performed with MAC Recommendation:           - Patient has a contact number available for                            emergencies. The signs and symptoms of potential                            delayed complications were discussed with the                            patient. Return to normal activities tomorrow.                            Written discharge instructions were provided to the                            patient.                           - Resume previous diet.                           - Continue present medications.                           - Await pathology results  with further                            recommendations Procedure Code(s):        --- Professional ---                           [phone], Colonoscopy, flexible; with removal of                            tumor(s), polyp(s), or other lesion(s) by snare                            technique                           45380, 59, Colonoscopy, flexible; with biopsy,  single or multiple Diagnosis Code(s):        --- Professional ---                           K64.8, Other hemorrhoids                           K63.5, Polyp of colon                           K52.9, Noninfective gastroenteritis and colitis,                            unspecified                           K62.5, Hemorrhage of anus and rectum                           Q43.8, Other specified congenital malformations of                            intestine CPT copyright 2019 American Medical Association. All rights reserved. The codes documented in this report are preliminary and upon coder review may  be revised to meet current compliance requirements. Remo Lipps P. Havery Moros, MD 10/17/2021 10:27:52 AM This report has been signed electronically. Number of Addenda: 0

## 2021-10-17 NOTE — Transfer of Care (Signed)
Immediate Anesthesia Transfer of Care Note  Patient: Deborah Callahan  Procedure(s) Performed: ESOPHAGOGASTRODUODENOSCOPY (EGD) WITH PROPOFOL COLONOSCOPY WITH PROPOFOL BIOPSY SAVORY DILATION POLYPECTOMY  Patient Location: PACU  Anesthesia Type:MAC  Level of Consciousness: awake  Airway & Oxygen Therapy: Patient Spontanous Breathing  Post-op Assessment: Report given to RN  Post vital signs: stable  Last Vitals:  Vitals Value Taken Time  BP 122/63 10/17/21 1030  Temp    Pulse 77 10/17/21 1033  Resp 14 10/17/21 1033  SpO2 100 % 10/17/21 1033  Vitals shown include unvalidated device data.  Last Pain:  Vitals:   10/17/21 1030  TempSrc:   PainSc: 0-No pain         Complications: No notable events documented.

## 2021-10-17 NOTE — Anesthesia Postprocedure Evaluation (Signed)
Anesthesia Post Note  Patient: Deborah Callahan  Procedure(s) Performed: ESOPHAGOGASTRODUODENOSCOPY (EGD) WITH PROPOFOL COLONOSCOPY WITH PROPOFOL BIOPSY SAVORY DILATION POLYPECTOMY     Patient location during evaluation: Endoscopy Anesthesia Type: MAC Level of consciousness: awake Pain management: pain level not controlled Vital Signs Assessment: post-procedure vital signs reviewed and stable Respiratory status: spontaneous breathing Cardiovascular status: stable Postop Assessment: no apparent nausea or vomiting Anesthetic complications: no   No notable events documented.  Last Vitals:  Vitals:   10/17/21 0849 10/17/21 1030  BP: (!) 175/64 122/63  Pulse: 71 81  Resp: 19 10  Temp: 36.6 C   SpO2: 100% 99%    Last Pain:  Vitals:   10/17/21 1030  TempSrc:   PainSc: Dickinson Jr

## 2021-10-18 LAB — SURGICAL PATHOLOGY

## 2021-10-19 ENCOUNTER — Encounter (HOSPITAL_COMMUNITY): Payer: Self-pay | Admitting: Gastroenterology

## 2021-11-08 ENCOUNTER — Telehealth: Payer: Self-pay

## 2021-11-08 ENCOUNTER — Other Ambulatory Visit: Payer: Self-pay | Admitting: Internal Medicine

## 2021-11-08 MED ORDER — SYNTHROID 100 MCG PO TABS: 100.0000 ug | ORAL_TABLET | Freq: Every day | ORAL | 5 refills | Status: DC

## 2021-11-08 NOTE — Telephone Encounter (Signed)
Patient states she is having lightheadedness, diarrhea,hives, and stomach bloating from the Levothyroxine. Patient would like to know if she could get on a different thyroid medication possibly a compound.

## 2021-11-08 NOTE — Telephone Encounter (Signed)
Patient advised and was transferred to front for scheduling. Also given information to the holistic clinic [facility].

## 2021-11-08 NOTE — Telephone Encounter (Signed)
Patient would like to change to the Synthroid if appropriate.

## 2021-11-13 ENCOUNTER — Encounter: Payer: Self-pay | Admitting: Internal Medicine

## 2021-11-13 ENCOUNTER — Ambulatory Visit (INDEPENDENT_AMBULATORY_CARE_PROVIDER_SITE_OTHER): Payer: Self-pay | Admitting: Internal Medicine

## 2021-11-13 VITALS — BP 120/70 | HR 94 | Ht 66.0 in | Wt 150.0 lb

## 2021-11-13 DIAGNOSIS — E039 Hypothyroidism, unspecified: Secondary | ICD-10-CM

## 2021-11-13 MED ORDER — LEVOTHYROXINE SODIUM 100 MCG PO TABS: 100.0000 ug | ORAL_TABLET | Freq: Every day | ORAL | 6 refills | Status: DC

## 2021-11-13 NOTE — Patient Instructions (Signed)

## 2021-11-13 NOTE — Progress Notes (Signed)
Name: Deborah Callahan  MRN/ DOB: [id number], 1960-10-06    Age/ Sex: 61 y.o., female    PCP: Inda Coke, PA   Reason for Endocrinology Evaluation: Hypothyroidism     Date of Initial Endocrinology Evaluation: 10/26/2020    HPI: Deborah Callahan is a 61 y.o. female with a past medical history of hypothyroidism, interstitial cystitis and IBS . The patient presented for initial endocrinology clinic visit on 10/26/2020 for consultative assistance with her Hypothyroidism .   She has been diagnosed with hypothyroidism many years ago She presented to her PCP in 08/2020 with complaints of fatigue, hair loss and brittle nails.    She had a thyroid ultrasound in early 2022 due to complaints of dysphagia, and choking sensation. The ultrasound was unrevealing without nodularity . Saw ENT in 05/2020 for this.   Saw integrative doctor, was on armour thyroid which was switched to levothyroxine years ago.     Mother with thyroid disease   On her initial visit to our clinic she had a low TSH at 0.03 u IU/mL, levothyroxine dose was reduced  A year after the intake of levothyroxine she called our office stating that she is allergic to levothyroxine with vomiting and diarrhea, and asked to switch to Synthroid.   She was also noted with elevated vitamin D at 111 NG/mL, we discontinued vitamin D and vitamin D levels normalized  SUBJECTIVE:    Today (11/13/21):  Deborah Callahan is here for a follow up on hypothyroid management.   She was seen by GI for intermittent diarrhea and rectal bleed in August 2023, along with dysphagia and globus sensation, She is S/P EGD showing reflux esophagitis/Colonoscopy was nonrevealing  Synthroid is cost prohibitive , switched back to levothyroxine   Saw Dermatology for whelps  , has been using FODMAP  She has been having fatigue She is on MVI   GI symptoms have been improving She is off Biotin  Levothyroxine 100 mcg daily  HISTORY:   Past Medical History:  Past Medical History:  Diagnosis Date   Arthritis    degenerative cerv. spine , beginning signs of carpal tunnel syndrome- L hand     Complication of anesthesia    Cystocele    Environmental and seasonal allergies    Family history of anesthesia complication    N&V- mother & sister   GERD (gastroesophageal reflux disease)    DIET CONTROLLED   Headache(784.0)    migraines    History of hiatal hernia    History of kidney stones    Hypothyroidism    IBS (irritable bowel syndrome)    IC (interstitial cystitis)    Mild intermittent asthma    Pelvic relaxation    Pneumonia    PONV (postoperative nausea and vomiting)    Wears glasses    Past Surgical History:  Past Surgical History:  Procedure Laterality Date   ABDOMINAL HYSTERECTOMY     partial hysterectomy   ANTERIOR AND POSTERIOR REPAIR  09/23/2014   Procedure: ANTERIOR (CYSTOCELE) AND POSTERIOR REPAIR (RECTOCELE);  Surgeon: Arvella Nigh, MD;  Location: [facility];  Service: Gynecology;;   ANTERIOR CERVICAL DECOMP/DISCECTOMY FUSION N/A 05/08/2012   Procedure: ACDF C5-7  ANTERIOR CERVICAL DECOMPRESSION/DISCECTOMY FUSION 2 LEVELS;  Surgeon: Melina Schools, MD;  Location: [facility];  Service: Orthopedics;  Laterality: N/A;   ANTERIOR CERVICAL DECOMP/DISCECTOMY FUSION N/A 02/09/2019   Procedure: ANTERIOR CERVICAL DECOMPRESSION FUSION CERVICAL 3-4, CERVICAL 4-5 WITH INSTRUMENTATION AND ALLOGRAFT;  Surgeon: Phylliss Bob,  MD;  Location: [facility];  Service: Orthopedics;  Laterality: N/A;   BIOPSY  10/17/2021   Procedure: BIOPSY;  Surgeon: Yetta Flock, MD;  Location: [facility];  Service: Gastroenterology;;   COLONOSCOPY  10/19/2005   COLONOSCOPY WITH PROPOFOL N/A 10/17/2021   Procedure: COLONOSCOPY WITH PROPOFOL;  Surgeon: Yetta Flock, MD;  Location: [facility];  Service: Gastroenterology;  Laterality: N/A;   D & C HYSTEROSCOPE/ RESECTION POLYP/  POSTERIOR AND ENTEROOCELE REPAIRS/   CARDINAL UTEROSACRAL COLPOSUSPENSION /  PERINEAL BODY REPAIR  04/22/2003   ESOPHAGOGASTRODUODENOSCOPY (EGD) WITH PROPOFOL N/A 10/17/2021   Procedure: ESOPHAGOGASTRODUODENOSCOPY (EGD) WITH PROPOFOL;  Surgeon: Yetta Flock, MD;  Location: [facility];  Service: Gastroenterology;  Laterality: N/A;   FRACTURE SURGERY Right 2005   Right shoulder with Dr. Theda Sers at [place]  11/02/2004   w/  Release posterior vaginal scar   LAPAROSCOPIC CHOLECYSTECTOMY  10/11/2001   POLYPECTOMY  10/17/2021   Procedure: POLYPECTOMY;  Surgeon: Yetta Flock, MD;  Location: [facility];  Service: Gastroenterology;;   Azzie Almas DILATION N/A 10/17/2021   Procedure: Azzie Almas DILATION;  Surgeon: Yetta Flock, MD;  Location: [facility];  Service: Gastroenterology;  Laterality: N/A;   TOOTH EXTRACTION     #31   TUBAL LIGATION  1988    Social History:  reports that she has never smoked. She has never used smokeless tobacco. She reports current alcohol use. She reports that she does not use drugs. Family History: family history includes Arthritis/Rheumatoid in her mother; Cancer in her maternal grandmother, mother, and sister; Diabetes in her mother and paternal grandfather.   HOME MEDICATIONS: Allergies as of 11/13/2021       Reactions   Codeine Anaphylaxis, Hives, Itching   Oxycodone-acetaminophen Hives, Shortness Of Breath, Itching, Swelling   Wheat Bran Anaphylaxis, Hives, Diarrhea, Swelling   AND  GLUTEN CONTAINING PRODUCTS   Bismuth-containing Compounds Other (See Comments)   Ulcer (pt to avoid due to md instruction)   Buspar [buspirone] Hives, Itching   Bleeding in scalp   Carafate [sucralfate] Hives, Itching, Nausea And Vomiting   Bleeding in scalp, headaches   Eggs Or Egg-derived Products Hives, Diarrhea, Nausea And Vomiting   Tolerated propofol on multiple occasions with no difficulty   Gluten Meal    Other reaction(s): Unknown    Lactose Intolerance (gi) Hives, Diarrhea, Nausea And Vomiting   All milk products  (rice milk is ok) (Goat milk)   Latex Itching, Other (See Comments)   Skin irritation/ reddness   Lentil Diarrhea, Swelling   Bowel irritation    Omeprazole Other (See Comments)   Multiple intolerances -- decrease in energy, headaches, neuropathy in hands, throat irritation, changes in appetite, muscle aches, itchiness accompanied by widespread tiny blisters over her body and in her mouth   Peanuts [peanut Oil] Hives, Diarrhea, Nausea And Vomiting   THIS INCLUDES PEANUTS AND ALMONDS ( TREE NUTS OK)   Pepcid [famotidine] Other (See Comments)   Soybean-containing Drug Products Hives, Nausea And Vomiting, Swelling   Tramadol Hives, Nausea And Vomiting, Swelling   Corn-containing Products Diarrhea        Medication List        Accurate as of November 13, 2021  3:51 PM. If you have any questions, ask your nurse or doctor.          STOP taking these medications    Synthroid 100 MCG tablet Generic drug: levothyroxine Stopped by: Dorita Sciara, MD  TAKE these medications    ADULT ONE DAILY GUMMIES PO Take 2 tablets by mouth daily. Vitafusion MultiVites   Blink Tears 0.25 % Soln Generic drug: Polyethylene Glycol 400 Place 1-2 drops into both eyes daily.   dicyclomine 20 MG tablet Commonly known as: BENTYL Take 1 tablet (20 mg total) by mouth every 6 (six) hours as needed (abdominal cramping pain).   IMMUNE FORMULA PO Take 1 tablet by mouth in the morning. [place] Immune Plus   LYSINE PO Take 1 Scoop by mouth daily.   MAGNESIUM CITRATE PO Take 500 mg by mouth daily. 250 mg/tablet   MELATONIN PO Take 6 mg by mouth at bedtime as needed (sleep).   ondansetron 4 MG tablet Commonly known as: ZOFRAN Take 1 tablet (4 mg total) by mouth every 4 (four) hours as needed for nausea.   Systane 0.4-0.3 % Soln Generic drug: Polyethyl Glycol-Propyl Glycol Place 1-2 drops  into both eyes 3 (three) times daily as needed (dry/irritated eyes.).   TURMERIC-GINGER PO Take 2 tablets by mouth daily. Gummies   Vitamin B-12 5000 MCG Subl Take 5,000 mcg by mouth in the morning.   VITAMIN K2 PO Take 1 capsule by mouth daily. Health As It Ought To Be Vitamin K2 Mk-7 179mg   Zicam Allergy Relief Gel Place 1 application  into the nose every other day. At night (Zicam Cold Remedy Nasal Swabs)          REVIEW OF SYSTEMS: A comprehensive ROS was conducted with the patient and is negative except as per HPI     OBJECTIVE:  VS: BP 120/70 (BP Location: Left Arm, Patient Position: Sitting, Cuff Size: Small)   Pulse 94   Ht '5\' 6"'$  (1.676 m)   Wt 150 lb (68 kg)   SpO2 95%   BMI 24.21 kg/m    Wt Readings from Last 3 Encounters:  11/13/21 150 lb (68 kg)  10/17/21 144 lb (65.3 kg)  07/11/21 148 lb (67.1 kg)     EXAM: General: Pt appears well and is in NAD  Neck: General: Supple without adenopathy. Thyroid: Thyroid size normal.  No goiter or nodules appreciated.   Lungs: Clear with good BS bilat with no rales, rhonchi, or wheezes  Heart: Auscultation: RRR.  Abdomen: Normoactive bowel sounds, soft, nontender, without masses or organomegaly palpable  Extremities:  BL LE: No pretibial edema normal ROM and strength.  Mental Status: Judgment, insight: Intact Orientation: Oriented to time, place, and person Mood and affect: No depression, anxiety, or agitation     DATA REVIEWED:   Latest Reference Range & Units 12/21/20 11:04  TSH 0.450 - 4.500 uIU/mL 0.137 (L)     ASSESSMENT/PLAN/RECOMMENDATIONS:   Hypothyroidism:  -Patient with multiple nonspecific symptoms that could be attributed to her thyroid -She initially requested to switch to Synthroid due to nausea and vomiting that she had attributed to an allergic reaction to levothyroxine, of note she has been on levothyroxine for over a year -She switched back to levothyroxine as the Synthroid was cost  prohibitive -She would like to have labs done at [facility] patient was printed a [facility] order for TSH -No reported allergies to dyes, but if so we will have to switch her to levothyroxine 50 mcg tablet    Medications :  Continue levothyroxine 100 MCG daily for now       Signed electronically by: AMack Guise MD  [facility]  [facility] [address] Phone: [phone] FAX: [phone]   CC: Inda Coke, [address] [place] [address] Phone: [phone] Fax: [phone]   Return to Endocrinology clinic as below: Future Appointments  Date Time Provider [place]  12/26/2021  9:00 AM Armbruster, Carlota Raspberry, MD [facility]  11/14/2022  1:20 PM Geneveive Furness, Melanie Crazier, MD [facility] None

## 2021-11-16 ENCOUNTER — Other Ambulatory Visit: Payer: Self-pay | Admitting: Internal Medicine

## 2021-11-17 ENCOUNTER — Other Ambulatory Visit: Payer: Self-pay | Admitting: Internal Medicine

## 2021-11-17 LAB — TSH: TSH: 0.4 mIU/L (ref 0.40–4.50)

## 2021-11-17 MED ORDER — LEVOTHYROXINE SODIUM 88 MCG PO TABS: 88.0000 ug | ORAL_TABLET | Freq: Every day | ORAL | 11 refills | Status: DC

## 2021-12-18 ENCOUNTER — Ambulatory Visit (INDEPENDENT_AMBULATORY_CARE_PROVIDER_SITE_OTHER): Payer: Self-pay | Admitting: Family Medicine

## 2021-12-18 ENCOUNTER — Encounter: Payer: Self-pay | Admitting: Family Medicine

## 2021-12-18 VITALS — BP 130/50 | HR 96 | Temp 98.7°F | Ht 66.0 in | Wt 150.0 lb

## 2021-12-18 DIAGNOSIS — J1282 Pneumonia due to coronavirus disease 2019: Secondary | ICD-10-CM

## 2021-12-18 DIAGNOSIS — R051 Acute cough: Secondary | ICD-10-CM

## 2021-12-18 DIAGNOSIS — U071 COVID-19: Secondary | ICD-10-CM

## 2021-12-18 LAB — POC COVID19 BINAXNOW: SARS Coronavirus 2 Ag: POSITIVE — AB

## 2021-12-18 MED ORDER — NIRMATRELVIR/RITONAVIR (PAXLOVID)TABLET: 3.0000 | ORAL_TABLET | Freq: Two times a day (BID) | ORAL | 0 refills | Status: AC

## 2021-12-18 MED ORDER — PROMETHAZINE-DM 6.25-15 MG/5ML PO SYRP: 5.0000 mL | ORAL_SOLUTION | Freq: Four times a day (QID) | ORAL | 0 refills | Status: DC | PRN

## 2021-12-18 NOTE — Progress Notes (Signed)
Subjective  CC:  Chief Complaint  Patient presents with   Cough    Pt stated that she has been having a cough since 12/16/2021   Same day acute visit; PCP not available. New pt to me. Chart reviewed.   HPI: Deborah Callahan is a 61 y.o. female who presents to the office today to address the problems listed above in the chief complaint. 61 year old female with hypothyroidism presents due to febrile illness.  Reports fevers to 103 for the last 2 nights.  He has URI symptoms including nasal congestion, sinus pressure, cough, but also reports feeling winded on exertion and some pleuritic chest pain.  Appetite is decreased, change in taste is present.  She was visiting her grandchildren this weekend, they did have colds.  She is not immunized against COVID.  She denies asthma or lung disease.  She has malaise and fatigue.  No renal dysfunction.  Reviewed medication list in detail. Assessment  1. Pneumonia due to COVID-19 virus   2. Acute cough      Plan  COVID infection with likely pneumonia by clinical exam: Respiratory status is stable.  Education and symptom surveillance discussed.  We will treat with Paxlovid and cough syrup.  Over-the-counter supportive medications discussed.  After visit summary includes instructions.  Isolation for 5 days.  Recommend urgent follow-up if she has worsening shortness of breath.  Recommend fluids and rest.  Follow up: As needed Visit date not found  Orders Placed This Encounter  Procedures   POC COVID-19   No orders of the defined types were placed in this encounter.     I reviewed the patients updated PMH, FH, and SocHx.    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   Anxiety 06/07/2020   Bilateral carpal tunnel syndrome 06/07/2020   Constipation 06/07/2020   Generalized  anxiety disorder 06/07/2020   Hypothyroidism 06/07/2020   Kidney stone 06/07/2020   Leukocytopenia 06/07/2020   Moderate recurrent major depression ([place]) 06/07/2020   Sacroiliitis, not elsewhere classified ([place]) 06/07/2020   Shoulder pain 06/07/2020   Ulnar neuropathy 06/07/2020   Dysphagia 05/23/2020   Hoarseness 05/23/2020   Myelopathy ([place]) 02/09/2019   Chest pain of uncertain etiology 82/42/3536   Pelvic relaxation 09/23/2014    Class: Present on Admission   Cervical disc disorder with radiculopathy of cervical region 04/29/2012   Chronic interstitial cystitis 05/16/2011   Hematuria 05/16/2011   Current Meds  Medication Sig   Cyanocobalamin (VITAMIN B-12) 5000 MCG SUBL Take 5,000 mcg by mouth in the morning.   dicyclomine (BENTYL) 20 MG tablet Take 1 tablet (20 mg total) by mouth every 6 (six) hours as needed (abdominal cramping pain).   Homeopathic Products (ZICAM ALLERGY RELIEF) GEL Place 1 application  into the nose every other day. At night (Zicam Cold Remedy Nasal Swabs)   levothyroxine (SYNTHROID) 88 MCG tablet Take 1 tablet (88 mcg total) by mouth daily.   LYSINE PO Take 1 Scoop by mouth daily.   MAGNESIUM CITRATE PO Take 500 mg by mouth daily. 250 mg/tablet   MELATONIN PO Take 6 mg by mouth at bedtime as needed (sleep).   Menaquinone-7 (VITAMIN K2 PO) Take 1 capsule by mouth daily. Health As It Ought To Be Vitamin K2 Mk-7 126mg   Misc Natural Products (  IMMUNE FORMULA PO) Take 1 tablet by mouth in the morning. [facility] Immune Plus   Multiple Vitamins-Minerals (ADULT ONE DAILY GUMMIES PO) Take 2 tablets by mouth daily. Vitafusion MultiVites   ondansetron (ZOFRAN) 4 MG tablet Take 1 tablet (4 mg total) by mouth every 4 (four) hours as needed for nausea.   Polyethyl Glycol-Propyl Glycol (SYSTANE) 0.4-0.3 % SOLN Place 1-2 drops into both eyes 3 (three) times daily as needed (dry/irritated eyes.).   Polyethylene Glycol 400 (BLINK TEARS) 0.25 % SOLN Place 1-2 drops into  both eyes daily.   TURMERIC-GINGER PO Take 2 tablets by mouth daily. Gummies    Allergies: Patient is allergic to codeine, oxycodone-acetaminophen, wheat bran, bismuth-containing compounds, buspar [buspirone], carafate [sucralfate], eggs or egg-derived products, gluten meal, lactose intolerance (gi), latex, lentil, omeprazole, peanuts [peanut oil], pepcid [famotidine], soybean-containing drug products, tramadol, and corn-containing products. Family History: Patient family history includes Arthritis/Rheumatoid in her mother; Cancer in her maternal grandmother, mother, and sister; Diabetes in her mother and paternal grandfather. Social History:  Patient  reports that she has never smoked. She has never used smokeless tobacco. She reports current alcohol use. She reports that she does not use drugs.  Review of Systems: Constitutional: Negative for fever malaise or anorexia Cardiovascular: negative for chest pain Respiratory: negative for SOB or persistent cough Gastrointestinal: negative for abdominal pain  Objective  Vitals: BP (!) 130/50   Pulse 96   Temp 98.7 F (37.1 C)   Ht '5\' 6"'$  (1.676 m)   Wt 150 lb (68 kg)   SpO2 98%   BMI 24.21 kg/m  General: no acute respiratory distress , A&Ox3, nontoxic-appearing HEENT: PEERL, conjunctiva normal, neck is supple, congestion present Cardiovascular:  RRR without murmur or gallop.  Respiratory:  Good breath sounds bilaterally, rales at left middle lobe audible, no other rales, wheezing or rhonchi present.  Good air movement.   Soft nontender abdomen  skin:  Warm, no rashes  Office Visit on 12/18/2021  Component Date Value Ref Range Status   SARS Coronavirus 2 Ag 12/18/2021 Positive (A)  Negative Final     Commons side effects, risks, benefits, and alternatives for medications and treatment plan prescribed today were discussed, and the patient expressed understanding of the given instructions. Patient is instructed to call or message via  MyChart if he/she has any questions or concerns regarding our treatment plan. No barriers to understanding were identified. We discussed Red Flag symptoms and signs in detail. Patient expressed understanding regarding what to do in case of urgent or emergency type symptoms.  Medication list was reconciled, printed and provided to the patient in AVS. Patient instructions and summary information was reviewed with the patient as documented in the AVS. This note was prepared with assistance of Dragon voice recognition software. Occasional wrong-word or sound-a-like substitutions may have occurred due to the inherent limitations of voice recognition software  This visit occurred during the SARS-CoV-2 public health emergency.  Safety protocols were in place, including screening questions prior to the visit, additional usage of staff PPE, and extensive cleaning of exam room while observing appropriate contact time as indicated for disinfecting solutions.

## 2021-12-18 NOTE — Patient Instructions (Signed)
Please follow up if symptoms do not improve or as needed.    COVID-19 COVID-19, or coronavirus disease 2019, is an infection that is caused by a new (novel) coronavirus called SARS-CoV-2. COVID-19 can cause many symptoms. In some people, the virus may not cause any symptoms. In others, it may cause mild or severe symptoms. Some people with severe infection develop severe disease. What are the causes? This illness is caused by a virus. The virus may be in the air as tiny specks of fluid (aerosols) or droplets, or it may be on surfaces. You may catch the virus by: Breathing in droplets from an infected person. Droplets can be spread by a person breathing, speaking, singing, coughing, or sneezing. Touching something, like a table or a doorknob, that has virus on it (is contaminated) and then touching your mouth, nose, or eyes. What increases the risk? Risk for infection: You are more likely to get infected with the COVID-19 virus if: You are within 6 ft (1.8 m) of a person with COVID-19 for 15 minutes or longer. You are providing care for a person who is infected with COVID-19. You are in close personal contact with other people. Close personal contact includes hugging, kissing, or sharing eating or drinking utensils. Risk for serious illness caused by COVID-19: You are more likely to get seriously ill from the COVID-19 virus if: You have cancer. You have a long-term (chronic) disease, such as: Chronic lung disease. This includes pulmonary embolism, chronic obstructive pulmonary disease, and cystic fibrosis. Long-term disease that lowers your body's ability to fight infection (immunocompromise). Serious cardiac conditions, such as heart failure, coronary artery disease, or cardiomyopathy. Diabetes. Chronic kidney disease. Liver diseases. These include cirrhosis, nonalcoholic fatty liver disease, alcoholic liver disease, or autoimmune hepatitis. You have obesity. You are pregnant or were  recently pregnant. You have sickle cell disease. What are the signs or symptoms? Symptoms of this condition can range from mild to severe. Symptoms may appear any time from 2 to 14 days after being exposed to the virus. They include: Fever or chills. Shortness of breath or trouble breathing. Feeling tired or very tired. Headaches, body aches, or muscle aches. Runny or stuffy nose, sneezing, coughing, or sore throat. New loss of taste or smell. This is rare. Some people may also have stomach problems, such as nausea, vomiting, or diarrhea. Other people may not have any symptoms of COVID-19. How is this diagnosed? This condition may be diagnosed by testing samples to check for the COVID-19 virus. The most common tests are the PCR test and the antigen test. Tests may be done in the lab or at home. They include: Using a swab to take a sample of fluid from the back of your nose and throat (nasopharyngeal fluid), from your nose, or from your throat. Testing a sample of saliva from your mouth. Testing a sample of coughed-up mucus from your lungs (sputum). How is this treated? Treatment for COVID-19 infection depends on the severity of the condition. Mild symptoms can be managed at home with rest, fluids, and over-the-counter medicines. Serious symptoms may be treated in a hospital intensive care unit (ICU). Treatment in the ICU may include: Supplemental oxygen. Extra oxygen is given through a tube in the nose, a face mask, or a hood. Medicines. These may include: Antivirals, such as monoclonal antibodies. These help your body fight off certain viruses that can cause disease. Anti-inflammatories, such as corticosteroids. These reduce inflammation and suppress the immune system. Antithrombotics. These prevent or treat  blood clots, if they develop. Convalescent plasma. This helps boost your immune system, if you have an underlying immunosuppressive condition or are getting immunosuppressive  treatments. Prone positioning. This means you will lie on your stomach. This helps oxygen to get into your lungs. Infection control measures. If you are at risk for more serious illness caused by COVID-19, your health care provider may prescribe two long-acting monoclonal antibodies, given together every 6 months. How is this prevented? To protect yourself: Use preventive medicine (pre-exposure prophylaxis). You may get pre-exposure prophylaxis if you have moderate or severe immunocompromise. Get vaccinated. Anyone 68 months old or older who meets guidelines can get a COVID-19 vaccine or vaccine series. This includes people who are pregnant or making breast milk (lactating). Get an added dose of COVID-19 vaccine after your first vaccine or vaccine series if you have moderate to severe immunocompromise. This applies if you have had a solid organ transplant or have been diagnosed with an immunocompromising condition. You should get the added dose 4 weeks after you got the first COVID-19 vaccine or vaccine series. If you get an mRNA vaccine, you will need a 3-dose primary series. If you get the J&J/Janssen vaccine, you will need a 2-dose primary series, with the second dose being an mRNA vaccine. Talk to your health care provider about getting experimental monoclonal antibodies. This treatment is approved under emergency use authorization to prevent severe illness before or after being exposed to the COVID-19 virus. You may be given monoclonal antibodies if: You have moderate or severe immunocompromise. This includes treatments that lower your immune response. People with immunocompromise may not develop protection against COVID-19 when they are vaccinated. You cannot be vaccinated. You may not get a vaccine if you have a severe allergic reaction to the vaccine or its components. You are not fully vaccinated. You are in a facility where COVID-19 is present and: Are in close contact with a person who is  infected with the COVID-19 virus. Are at high risk of being exposed to the COVID-19 virus. You are at risk of illness from new variants of the COVID-19 virus. To protect others: If you have symptoms of COVID-19, take steps to prevent the virus from spreading to others. Stay home. Leave your house only to get medical care. Do not use public transit, if possible. Do not travel while you are sick. Wash your hands often with soap and water for at least 20 seconds. If soap and water are not available, use alcohol-based hand sanitizer. Make sure that all people in your household wash their hands well and often. Cough or sneeze into a tissue or your sleeve or elbow. Do not cough or sneeze into your hand or into the air. Where to find more information Centers for Disease Control and Prevention: [employer] World Health Organization: https://www.castaneda.info/ Get help right away if: You have trouble breathing. You have pain or pressure in your chest. You are confused. You have bluish lips and fingernails. You have trouble waking from sleep. You have symptoms that get worse. These symptoms may be an emergency. Get help right away. Call 911. Do not wait to see if the symptoms will go away. Do not drive yourself to the hospital. Summary COVID-19 is an infection that is caused by a new coronavirus. Sometimes, there are no symptoms. Other times, symptoms range from mild to severe. Some people with a severe COVID-19 infection develop severe disease. The virus that causes COVID-19 can spread from person to person through droplets or aerosols  from breathing, speaking, singing, coughing, or sneezing. Mild symptoms of COVID-19 can be managed at home with rest, fluids, and over-the-counter medicines. This information is not intended to replace advice given to you by your health care provider. Make sure you discuss any questions you have with your health care provider. Document  Revised: 01/24/2021 Document Reviewed: 01/26/2021 Elsevier Patient Education  [place].

## 2021-12-19 ENCOUNTER — Telehealth: Payer: Self-pay | Admitting: Physician Assistant

## 2021-12-19 NOTE — Telephone Encounter (Signed)
Patient states: -She is having an issue with excessive phlegm causing her nausea  - She is also experiencing worsened coughing  - She has seen Dr. Jonni Sanger on 10/30 and was prescribe paxlovid and promethazine due to [place] dx  Patient requests: - She would like a cough medicine that could help with phlegm but that doesn't contain codeine since she is allergic  - A callback with updates

## 2021-12-19 NOTE — Telephone Encounter (Signed)
Spoke to pt told her [facility] said okay to use Delsym and Mucinex OTC for your cough. Pt verbalized understanding.

## 2021-12-26 ENCOUNTER — Ambulatory Visit: Payer: Self-pay | Admitting: Gastroenterology

## 2022-01-09 ENCOUNTER — Telehealth: Payer: Self-pay | Admitting: Physician Assistant

## 2022-01-09 ENCOUNTER — Ambulatory Visit: Payer: Self-pay | Admitting: Family Medicine

## 2022-01-09 ENCOUNTER — Ambulatory Visit: Payer: Self-pay | Admitting: Nurse Practitioner

## 2022-01-09 NOTE — Telephone Encounter (Signed)
Pt called in to cancel her app with Dr. Carolann Littler for an OV on 01/09/22. She just got a call from her dentist to get a tooth pulled that has caused an infection, she needs to cancel this app. I did not send her a letter.

## 2022-01-13 ENCOUNTER — Ambulatory Visit
Admission: EM | Admit: 2022-01-13 | Discharge: 2022-01-13 | Disposition: A | Discharge status: Home / Self Care | Payer: Self-pay | Attending: Urgent Care | Admitting: Urgent Care

## 2022-01-13 ENCOUNTER — Ambulatory Visit (INDEPENDENT_AMBULATORY_CARE_PROVIDER_SITE_OTHER): Payer: Self-pay

## 2022-01-13 DIAGNOSIS — R051 Acute cough: Secondary | ICD-10-CM

## 2022-01-13 DIAGNOSIS — R059 Cough, unspecified: Secondary | ICD-10-CM

## 2022-01-13 DIAGNOSIS — Z8616 Personal history of COVID-19: Secondary | ICD-10-CM

## 2022-01-13 DIAGNOSIS — R0602 Shortness of breath: Secondary | ICD-10-CM

## 2022-01-13 DIAGNOSIS — J4520 Mild intermittent asthma, uncomplicated: Secondary | ICD-10-CM

## 2022-01-13 MED ORDER — PROMETHAZINE-DM 6.25-15 MG/5ML PO SYRP: 5.0000 mL | ORAL_SOLUTION | Freq: Three times a day (TID) | ORAL | 0 refills | Status: DC | PRN

## 2022-01-13 MED ORDER — PREDNISONE 50 MG PO TABS: 50.0000 mg | ORAL_TABLET | Freq: Every day | ORAL | 0 refills | Status: DC

## 2022-01-13 MED ORDER — ALBUTEROL SULFATE HFA 108 (90 BASE) MCG/ACT IN AERS: 1.0000 | INHALATION_SPRAY | Freq: Four times a day (QID) | RESPIRATORY_TRACT | 0 refills | Status: DC | PRN

## 2022-01-13 NOTE — ED Triage Notes (Signed)
Pt c/o cough, SHOB, fever, wheezing x 2 weeks-states she was dx with covid 10/28-also states she completed a zpack today after dental issue-NAD-steady gait

## 2022-01-13 NOTE — ED Provider Notes (Signed)
[facility]  Note:  This document was prepared using Systems analyst and may include unintentional dictation errors.  MRN: [id number] DOB: [birth year]  Subjective:   Deborah Callahan is a 61 y.o. female presenting for 2 week history of acute onset wheezing, coughing fits, chest tightness and pressure.  Was diagnosed with COVID-19 at the end of October.  Underwent a course of Paxlovid.  Has continued to have symptoms but more prominently in the past 2 weeks.  She is actually had a dental issue for which she finished a course of azithromycin, last dose was today.  Has a history of asthma but has not needed an inhaler for 10 years.  No history of CKD.  No current facility-administered medications for this encounter.  Current Outpatient Medications:    Cyanocobalamin (VITAMIN B-12) 5000 MCG SUBL, Take 5,000 mcg by mouth in the morning., Disp: , Rfl:    dicyclomine (BENTYL) 20 MG tablet, Take 1 tablet (20 mg total) by mouth every 6 (six) hours as needed (abdominal cramping pain)., Disp: 30 tablet, Rfl: 0   Homeopathic Products (ZICAM ALLERGY RELIEF) GEL, Place 1 application  into the nose every other day. At night (Zicam Cold Remedy Nasal Swabs), Disp: , Rfl:    levothyroxine (SYNTHROID) 88 MCG tablet, Take 1 tablet (88 mcg total) by mouth daily., Disp: 30 tablet, Rfl: 11   LYSINE PO, Take 1 Scoop by mouth daily., Disp: , Rfl:    MAGNESIUM CITRATE PO, Take 500 mg by mouth daily. 250 mg/tablet, Disp: , Rfl:    MELATONIN PO, Take 6 mg by mouth at bedtime as needed (sleep)., Disp: , Rfl:    Menaquinone-7 (VITAMIN K2 PO), Take 1 capsule by mouth daily. Health As It Ought To Be Vitamin K2 Mk-7 15mg, Disp: , Rfl:    Misc Natural Products (IMMUNE FORMULA PO), Take 1 tablet by mouth in the morning. [place]e Plus, Disp: , Rfl:    Multiple Vitamins-Minerals (ADULT ONE DAILY GUMMIES PO), Take 2 tablets by mouth daily. Vitafusion MultiVites,  Disp: , Rfl:    ondansetron (ZOFRAN) 4 MG tablet, Take 1 tablet (4 mg total) by mouth every 4 (four) hours as needed for nausea., Disp: 30 tablet, Rfl: 0   Polyethyl Glycol-Propyl Glycol (SYSTANE) 0.4-0.3 % SOLN, Place 1-2 drops into both eyes 3 (three) times daily as needed (dry/irritated eyes.)., Disp: , Rfl:    Polyethylene Glycol 400 (BLINK TEARS) 0.25 % SOLN, Place 1-2 drops into both eyes daily., Disp: , Rfl:    promethazine-dextromethorphan (PROMETHAZINE-DM) 6.25-15 MG/5ML syrup, Take 5 mLs by mouth 4 (four) times daily as needed for cough., Disp: 240 mL, Rfl: 0   TURMERIC-GINGER PO, Take 2 tablets by mouth daily. Gummies, Disp: , Rfl:    Allergies  Allergen Reactions   Codeine Anaphylaxis, Hives and Itching   Oxycodone-Acetaminophen Hives, Shortness Of Breath, Itching and Swelling   Wheat Bran Anaphylaxis, Hives, Diarrhea and Swelling    AND  GLUTEN CONTAINING PRODUCTS   Bismuth-Containing Compounds Other (See Comments)    Ulcer (pt to avoid due to md instruction)   Buspar [Buspirone] Hives and Itching    Bleeding in scalp   Carafate [Sucralfate] Hives, Itching and Nausea And Vomiting    Bleeding in scalp, headaches   Eggs Or Egg-Derived Products Hives, Diarrhea and Nausea And Vomiting    Tolerated propofol on multiple occasions with no difficulty   Gluten Meal     Other reaction(s): Unknown  Lactose Intolerance (Gi) Hives, Diarrhea and Nausea And Vomiting    All milk products  (rice milk is ok) (Goat milk)   Latex Itching and Other (See Comments)    Skin irritation/ reddness   Lentil Diarrhea and Swelling    Bowel irritation    Omeprazole Other (See Comments)    Multiple intolerances -- decrease in energy, headaches, neuropathy in hands, throat irritation, changes in appetite, muscle aches, itchiness accompanied by widespread tiny blisters over her body and in her mouth   Peanuts [Peanut Oil] Hives, Diarrhea and Nausea And Vomiting    THIS INCLUDES PEANUTS AND ALMONDS ( TREE  NUTS OK)   Pepcid [Famotidine] Other (See Comments)   Soybean-Containing Drug Products Hives, Nausea And Vomiting and Swelling   Tramadol Hives, Nausea And Vomiting and Swelling   Corn-Containing Products Diarrhea    Past Medical History:  Diagnosis Date   Arthritis    degenerative cerv. spine , beginning signs of carpal tunnel syndrome- L hand     Complication of anesthesia    Cystocele    Environmental and seasonal allergies    Family history of anesthesia complication    N&V- mother & sister   GERD (gastroesophageal reflux disease)    DIET CONTROLLED   Headache(784.0)    migraines    History of hiatal hernia    History of kidney stones    Hypothyroidism    IBS (irritable bowel syndrome)    IC (interstitial cystitis)    Mild intermittent asthma    Pelvic relaxation    Pneumonia    PONV (postoperative nausea and vomiting)    Wears glasses      Past Surgical History:  Procedure Laterality Date   ABDOMINAL HYSTERECTOMY     partial hysterectomy   ANTERIOR AND POSTERIOR REPAIR  09/23/2014   Procedure: ANTERIOR (CYSTOCELE) AND POSTERIOR REPAIR (RECTOCELE);  Surgeon: Arvella Nigh, MD;  Location: [facility];  Service: Gynecology;;   ANTERIOR CERVICAL DECOMP/DISCECTOMY FUSION N/A 05/08/2012   Procedure: ACDF C5-7  ANTERIOR CERVICAL DECOMPRESSION/DISCECTOMY FUSION 2 LEVELS;  Surgeon: Melina Schools, MD;  Location: [place];  Service: Orthopedics;  Laterality: N/A;   ANTERIOR CERVICAL DECOMP/DISCECTOMY FUSION N/A 02/09/2019   Procedure: ANTERIOR CERVICAL DECOMPRESSION FUSION CERVICAL 3-4, CERVICAL 4-5 WITH INSTRUMENTATION AND ALLOGRAFT;  Surgeon: Phylliss Bob, MD;  Location: [place];  Service: Orthopedics;  Laterality: N/A;   BIOPSY  10/17/2021   Procedure: BIOPSY;  Surgeon: Yetta Flock, MD;  Location: [facility];  Service: Gastroenterology;;   COLONOSCOPY  10/19/2005   COLONOSCOPY WITH PROPOFOL N/A 10/17/2021   Procedure: COLONOSCOPY WITH PROPOFOL;  Surgeon:  Yetta Flock, MD;  Location: [facility];  Service: Gastroenterology;  Laterality: N/A;   D & C HYSTEROSCOPE/ RESECTION POLYP/  POSTERIOR AND ENTEROOCELE REPAIRS/  CARDINAL UTEROSACRAL COLPOSUSPENSION /  PERINEAL BODY REPAIR  04/22/2003   ESOPHAGOGASTRODUODENOSCOPY (EGD) WITH PROPOFOL N/A 10/17/2021   Procedure: ESOPHAGOGASTRODUODENOSCOPY (EGD) WITH PROPOFOL;  Surgeon: Yetta Flock, MD;  Location: [facility];  Service: Gastroenterology;  Laterality: N/A;   FRACTURE SURGERY Right 2005   Right shoulder with Dr. Theda Sers at [place]  11/02/2004   w/  Release posterior vaginal scar   LAPAROSCOPIC CHOLECYSTECTOMY  10/11/2001   POLYPECTOMY  10/17/2021   Procedure: POLYPECTOMY;  Surgeon: Yetta Flock, MD;  Location: [facility];  Service: Gastroenterology;;   Azzie Almas DILATION N/A 10/17/2021   Procedure: Azzie Almas DILATION;  Surgeon: Yetta Flock, MD;  Location: [facility];  Service: Gastroenterology;  Laterality: N/A;   TOOTH EXTRACTION     #31   TUBAL LIGATION  1988    Family History  Problem Relation Age of Onset   Cancer Mother        breast   Arthritis/Rheumatoid Mother    Diabetes Mother    Cancer Sister        breast   Cancer Maternal Grandmother        breast   Diabetes Paternal Grandfather     Social History   Tobacco Use   Smoking status: Never   Smokeless tobacco: Never  Vaping Use   Vaping Use: Never used  Substance Use Topics   Alcohol use: Yes    Comment: OCCASIONAL WINE   Drug use: No    ROS   Objective:   Vitals: BP (!) 146/76 (BP Location: Left Arm)   Pulse 88   Temp 100 F (37.8 C) (Oral)   Resp 16   SpO2 95%   Physical Exam Constitutional:      General: She is not in acute distress.    Appearance: Normal appearance. She is well-developed. She is not ill-appearing, toxic-appearing or diaphoretic.  HENT:     Head: Normocephalic and atraumatic.     Nose: Nose  normal.     Mouth/Throat:     Mouth: Mucous membranes are moist.  Eyes:     General: No scleral icterus.       Right eye: No discharge.        Left eye: No discharge.     Extraocular Movements: Extraocular movements intact.  Cardiovascular:     Rate and Rhythm: Normal rate and regular rhythm.     Heart sounds: Normal heart sounds. No murmur heard.    No friction rub. No gallop.  Pulmonary:     Effort: Pulmonary effort is normal. No respiratory distress.     Breath sounds: No stridor. Wheezing (With coughing and inspiration) present. No rhonchi or rales.  Chest:     Chest wall: No tenderness.  Skin:    General: Skin is warm and dry.  Neurological:     General: No focal deficit present.     Mental Status: She is alert and oriented to person, place, and time.  Psychiatric:        Mood and Affect: Mood normal.        Behavior: Behavior normal.    DG Chest 2 View  Result Date: 01/13/2022 CLINICAL DATA:  Cough and shortness of breath.  Fever. EXAM: CHEST - 2 VIEW COMPARISON:  02/08/2020 FINDINGS: The lungs are clear without focal pneumonia, edema, pneumothorax or pleural effusion. The cardiopericardial silhouette is within normal limits for size. The visualized bony structures of the thorax are unremarkable. IMPRESSION: No active cardiopulmonary disease. Electronically Signed   By: Misty Stanley M.D.   On: 01/13/2022 13:53     Assessment and Plan :   PDMP not reviewed this encounter.  1. Acute cough   2. Mild intermittent asthma without complication   3. History of COVID-19     Chest x-ray negative.  Given the ongoing respiratory symptoms, wheezing on exam recommended a oral prednisone course.  Refilled her albuterol inhaler.  Use supportive care otherwise. Counseled patient on potential for adverse effects with medications prescribed/recommended today, ER and return-to-clinic precautions discussed, patient verbalized understanding.    Jaynee Eagles, PA-C 01/13/22 1357

## 2022-01-18 ENCOUNTER — Ambulatory Visit
Admission: EM | Admit: 2022-01-18 | Discharge: 2022-01-18 | Disposition: A | Discharge status: Home / Self Care | Payer: Self-pay | Attending: Nurse Practitioner | Admitting: Nurse Practitioner

## 2022-01-18 DIAGNOSIS — J0100 Acute maxillary sinusitis, unspecified: Secondary | ICD-10-CM

## 2022-01-18 MED ORDER — AMOXICILLIN-POT CLAVULANATE 875-125 MG PO TABS: 1.0000 | ORAL_TABLET | Freq: Two times a day (BID) | ORAL | 0 refills | Status: AC

## 2022-01-18 NOTE — Discharge Instructions (Signed)
Augmentin twice daily for 10 days Start Flonase over-the-counter each nostril once a day Aggie Moats that you tolerate Rest and fluids Please follow-up with your PCP 2 to 3 days for recheck Please go to the emergency room for any worsening symptoms I hope you feel better soon!

## 2022-01-18 NOTE — ED Triage Notes (Signed)
Pt states congestion and headache states she has been sick for over a month. Has been on antibiotics  and steroids.

## 2022-01-18 NOTE — ED Provider Notes (Signed)
[facility]    CSN: [id number] Arrival date & time: 01/18/22  1248      History   Chief Complaint Chief Complaint  Patient presents with   Nasal Congestion    HPI Deborah Callahan is a 61 y.o. female presents for evaluation of sinus pressure/pain.  Patient reports 1 month of sinus pressure/pain with cough.  Patient was seen by her primary care on October 30 2 nights of cough and fever.  She did test positive for COVID and was treated with Paxlovid cough medication.  She was also told at that time her exam indicated that she had pneumonia.  No antibiotics were initiated.  She continued to have a cough and on 11/25 she did go to urgent care for evaluation.  Chest x-ray was negative at that time.  She was started on albuterol inhaler and prednisone.  She reports minimal improvement on prednisone.  Today she reports over the past few days her sinus pressure has worsened and she endorses purulent/bloody nasal discharge.  Continues to have a productive cough with phlegm as well.  Denies sore throat, fevers, shortness of breath.  She is vaccinated for COVID and flu.  She has been using over-the-counter Delsym in addition to prescribed medications for her symptoms.  No other concerns at this time.  HPI  Past Medical History:  Diagnosis Date   Arthritis    degenerative cerv. spine , beginning signs of carpal tunnel syndrome- L hand     Complication of anesthesia    Cystocele    Environmental and seasonal allergies    Family history of anesthesia complication    N&V- mother & sister   GERD (gastroesophageal reflux disease)    DIET CONTROLLED   Headache(784.0)    migraines    History of hiatal hernia    History of kidney stones    Hypothyroidism    IBS (irritable bowel syndrome)    IC (interstitial cystitis)    Mild intermittent asthma    Pelvic relaxation    Pneumonia    PONV (postoperative nausea and vomiting)    Wears glasses     Patient Active Problem List    Diagnosis Date Noted   Chronic diarrhea    Benign neoplasm of colon    Rectal bleeding    Gastroesophageal reflux disease    Bloating    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   Anxiety 06/07/2020   Bilateral carpal tunnel syndrome 06/07/2020   Constipation 06/07/2020   Generalized anxiety disorder 06/07/2020   Hypothyroidism 06/07/2020   Kidney stone 06/07/2020   Leukocytopenia 06/07/2020   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/07/2020   Sacroiliitis, not elsewhere classified ([place]) 06/07/2020   Shoulder pain 06/07/2020   Ulnar neuropathy 06/07/2020   Dysphagia 05/23/2020   Hoarseness 05/23/2020   Myelopathy ([place]) 02/09/2019   Chest pain of uncertain etiology 65/78/4696   Pelvic relaxation 09/23/2014    Class: Present on Admission   Cervical disc disorder with radiculopathy of cervical region 04/29/2012   Chronic interstitial cystitis 05/16/2011   Hematuria 05/16/2011    Past Surgical History:  Procedure Laterality Date   ABDOMINAL HYSTERECTOMY     partial hysterectomy   ANTERIOR AND POSTERIOR REPAIR  09/23/2014   Procedure: ANTERIOR (CYSTOCELE) AND POSTERIOR REPAIR (RECTOCELE);  Surgeon: Arvella Nigh, MD;  Location: [facility];  Service: Gynecology;;   ANTERIOR CERVICAL DECOMP/DISCECTOMY FUSION N/A 05/08/2012   Procedure: ACDF C5-7  ANTERIOR CERVICAL DECOMPRESSION/DISCECTOMY FUSION 2 LEVELS;  Surgeon: Melina Schools, MD;  Location: [place];  Service: Orthopedics;  Laterality: N/A;   ANTERIOR CERVICAL DECOMP/DISCECTOMY FUSION N/A 02/09/2019   Procedure: ANTERIOR CERVICAL DECOMPRESSION FUSION CERVICAL 3-4, CERVICAL 4-5 WITH INSTRUMENTATION AND ALLOGRAFT;  Surgeon: Phylliss Bob, MD;  Location: [place];  Service: Orthopedics;  Laterality: N/A;   BIOPSY  10/17/2021   Procedure: BIOPSY;  Surgeon: Yetta Flock, MD;  Location[facility];  Service: Gastroenterology;;   COLONOSCOPY  10/19/2005   COLONOSCOPY WITH PROPOFOL N/A 10/17/2021   Procedure: COLONOSCOPY  WITH PROPOFOL;  Surgeon: Yetta Flock, MD;  Location: [facility];  Service: Gastroenterology;  Laterality: N/A;   D & C HYSTEROSCOPE/ RESECTION POLYP/  POSTERIOR AND ENTEROOCELE REPAIRS/  CARDINAL UTEROSACRAL COLPOSUSPENSION /  PERINEAL BODY REPAIR  04/22/2003   ESOPHAGOGASTRODUODENOSCOPY (EGD) WITH PROPOFOL N/A 10/17/2021   Procedure: ESOPHAGOGASTRODUODENOSCOPY (EGD) WITH PROPOFOL;  Surgeon: Yetta Flock, MD;  Location[facility];  Service: Gastroenterology;  Laterality: N/A;   FRACTURE SURGERY Right 2005   Right shoulder with Dr. Theda Sers at [place]  11/02/2004   w/  Release posterior vaginal scar   LAPAROSCOPIC CHOLECYSTECTOMY  10/11/2001   POLYPECTOMY  10/17/2021   Procedure: POLYPECTOMY;  Surgeon: Yetta Flock, MD;  Location: [facility];  Service: Gastroenterology;;   Azzie Almas DILATION N/A 10/17/2021   Procedure: Azzie Almas DILATION;  Surgeon: Yetta Flock, MD;  Location[facility];  Service: Gastroenterology;  Laterality: N/A;   TOOTH EXTRACTION     #31   TUBAL LIGATION  1988    OB History   No obstetric history on file.      Home Medications    Prior to Admission medications   Medication Sig Start Date End Date Taking? Authorizing Provider  amoxicillin-clavulanate (AUGMENTIN) 875-125 MG tablet Take 1 tablet by mouth every 12 (twelve) hours for 10 days. 01/18/22 01/28/22 Yes Melynda Ripple, NP  albuterol (VENTOLIN HFA) 108 (90 Base) MCG/ACT inhaler Inhale 1-2 puffs into the lungs every 6 (six) hours as needed for wheezing or shortness of breath. 01/13/22   Jaynee Eagles, PA-C  Cyanocobalamin (VITAMIN B-12) 5000 MCG SUBL Take 5,000 mcg by mouth in the morning.    [provider]  dicyclomine (BENTYL) 20 MG tablet Take 1 tablet (20 mg total) by mouth every 6 (six) hours as needed (abdominal cramping pain). 08/29/21   Irene Shipper, MD  Homeopathic Products [facility] ALLERGY RELIEF) GEL  Place 1 application  into the nose every other day. At night (Zicam Cold Remedy Nasal Swabs)    [provider]  levothyroxine (SYNTHROID) 88 MCG tablet Take 1 tablet (88 mcg total) by mouth daily. 11/17/21   Shamleffer, Melanie Crazier, MD  LYSINE PO Take 1 Scoop by mouth daily.    [provider]  MAGNESIUM CITRATE PO Take 500 mg by mouth daily. 250 mg/tablet    [provider]  MELATONIN PO Take 6 mg by mouth at bedtime as needed (sleep).    [provider]  Menaquinone-7 (VITAMIN K2 PO) Take 1 capsule by mouth daily. Health As It Ought To Be Vitamin K2 Mk-7 185mg    [provider]  Misc Natural Products (IMMUNE FORMULA PO) Take 1 tablet by mouth in the morning. [place]e Plus    [provider]  Multiple Vitamins-Minerals (ADULT ONE DAILY GUMMIES PO) Take 2 tablets by mouth daily. Vitafusion MultiVites    [provider]  ondansetron (ZOFRAN) 4 MG tablet Take 1 tablet (4 mg total) by mouth  every 4 (four) hours as needed for nausea. 08/29/21   Irene Shipper, MD  Polyethyl Glycol-Propyl Glycol (SYSTANE) 0.4-0.3 % SOLN Place 1-2 drops into both eyes 3 (three) times daily as needed (dry/irritated eyes.).    [provider]  Polyethylene Glycol 400 (BLINK TEARS) 0.25 % SOLN Place 1-2 drops into both eyes daily.    [provider]  predniSONE (DELTASONE) 50 MG tablet Take 1 tablet (50 mg total) by mouth daily with breakfast. 01/13/22   Jaynee Eagles, PA-C  promethazine-dextromethorphan (PROMETHAZINE-DM) 6.25-15 MG/5ML syrup Take 5 mLs by mouth 3 (three) times daily as needed for cough. 01/13/22   Jaynee Eagles, PA-C  TURMERIC-GINGER PO Take 2 tablets by mouth daily. Gummies    [provider]    Family History Family History  Problem Relation Age of Onset   Cancer Mother        breast   Arthritis/Rheumatoid Mother    Diabetes Mother    Cancer Sister        breast   Cancer Maternal Grandmother         breast   Diabetes Paternal Grandfather     Social History Social History   Tobacco Use   Smoking status: Never   Smokeless tobacco: Never  Vaping Use   Vaping Use: Never used  Substance Use Topics   Alcohol use: Yes    Comment: OCCASIONAL WINE   Drug use: No     Allergies   Codeine, Oxycodone-acetaminophen, Wheat bran, Bismuth-containing compounds, Buspar [buspirone], Carafate [sucralfate], Eggs or egg-derived products, Gluten meal, Lactose intolerance (gi), Latex, Lentil, Omeprazole, Peanuts [peanut oil], Pepcid [famotidine], Soybean-containing drug products, Tramadol, and Corn-containing products   Review of Systems Review of Systems  HENT:  Positive for congestion, sinus pressure and sinus pain.   Respiratory:  Positive for cough.      Physical Exam Triage Vital Signs ED Triage Vitals  Enc Vitals Group     BP 01/18/22 1302 (!) 156/62     Pulse Rate 01/18/22 1259 73     Resp 01/18/22 1259 16     Temp 01/18/22 1259 98.3 F (36.8 C)     Temp src --      SpO2 01/18/22 1259 92 %     Weight --      Height --      Head Circumference --      Peak Flow --      Pain Score 01/18/22 1301 8     Pain Loc --      Pain Edu? --      Excl. in [place]? --    No data found. Re-check oxygen saturation was 95%  Updated Vital Signs BP (!) 156/62 (BP Location: Right Arm)   Pulse 73   Temp 98.3 F (36.8 C)   Resp 16   SpO2 92%   Visual Acuity Right Eye Distance:   Left Eye Distance:   Bilateral Distance:    Right Eye Near:   Left Eye Near:    Bilateral Near:     Physical Exam Vitals and nursing note reviewed.  Constitutional:      General: She is not in acute distress.    Appearance: She is well-developed. She is not ill-appearing.  HENT:     Head: Normocephalic and atraumatic.     Right Ear: Tympanic membrane and ear canal normal.     Left Ear: Tympanic membrane and ear canal normal.     Nose: Congestion present.  Right Turbinates: Swollen and pale.      Left Turbinates: Swollen and pale.     Right Sinus: Maxillary sinus tenderness and frontal sinus tenderness present.     Left Sinus: Maxillary sinus tenderness and frontal sinus tenderness present.     Mouth/Throat:     Mouth: Mucous membranes are moist.     Pharynx: Oropharynx is clear. Uvula midline. No posterior oropharyngeal erythema.     Tonsils: No tonsillar exudate or tonsillar abscesses.  Eyes:     Conjunctiva/sclera: Conjunctivae normal.     Pupils: Pupils are equal, round, and reactive to light.  Cardiovascular:     Rate and Rhythm: Normal rate and regular rhythm.     Heart sounds: Normal heart sounds.  Pulmonary:     Effort: Pulmonary effort is normal.     Breath sounds: Normal breath sounds.  Musculoskeletal:     Cervical back: Normal range of motion and neck supple.  Lymphadenopathy:     Cervical: No cervical adenopathy.  Skin:    General: Skin is warm and dry.  Neurological:     General: No focal deficit present.     Mental Status: She is alert and oriented to person, place, and time.  Psychiatric:        Mood and Affect: Mood normal.        Behavior: Behavior normal.      UC Treatments / Results  Labs (all labs ordered are listed, but only abnormal results are displayed) Labs Reviewed - No data to display  EKG   Radiology No results found.  Procedures Procedures (including critical care time)  Medications Ordered in UC Medications - No data to display  Initial Impression / Assessment and Plan / UC Course  I have reviewed the triage vital signs and the nursing notes.  Pertinent labs & imaging results that were available during my care of the patient were reviewed by me and considered in my medical decision making (see chart for details).     Reviewed exam and sx with patient  Given LOS and worsening sinus pressure, start Augmentin Flonase daily Nasal rinses as tolerated Continue OTC cough medicine as needed Continue albuterol inhaler as  needed Discussed with patient that pneumonia can cause a cough for up to 6 weeks Patient to follow-up with PCP in 2 to 3 days for recheck ER precautions reviewed and she verbalized understanding Final Clinical Impressions(s) / UC Diagnoses   Final diagnoses:  Acute maxillary sinusitis, recurrence not specified     Discharge Instructions      Augmentin twice daily for 10 days Start Flonase over-the-counter each nostril once a day Aggie Moats that you tolerate Rest and fluids Please follow-up with your PCP 2 to 3 days for recheck Please go to the emergency room for any worsening symptoms I hope you feel better soon!   ED Prescriptions     Medication Sig Dispense Auth. Provider   amoxicillin-clavulanate (AUGMENTIN) 875-125 MG tablet Take 1 tablet by mouth every 12 (twelve) hours for 10 days. 20 tablet Melynda Ripple, NP      PDMP not reviewed this encounter.   Melynda Ripple, NP 01/18/22 1331

## 2022-02-16 ENCOUNTER — Ambulatory Visit (INDEPENDENT_AMBULATORY_CARE_PROVIDER_SITE_OTHER): Payer: Self-pay

## 2022-02-16 ENCOUNTER — Ambulatory Visit
Admission: EM | Admit: 2022-02-16 | Discharge: 2022-02-16 | Disposition: A | Discharge status: Home / Self Care | Payer: Self-pay | Attending: Emergency Medicine | Admitting: Emergency Medicine

## 2022-02-16 DIAGNOSIS — R069 Unspecified abnormalities of breathing: Secondary | ICD-10-CM

## 2022-02-16 DIAGNOSIS — B9689 Other specified bacterial agents as the cause of diseases classified elsewhere: Secondary | ICD-10-CM

## 2022-02-16 DIAGNOSIS — H6993 Unspecified Eustachian tube disorder, bilateral: Secondary | ICD-10-CM

## 2022-02-16 DIAGNOSIS — J019 Acute sinusitis, unspecified: Secondary | ICD-10-CM

## 2022-02-16 DIAGNOSIS — R0689 Other abnormalities of breathing: Secondary | ICD-10-CM

## 2022-02-16 DIAGNOSIS — H66003 Acute suppurative otitis media without spontaneous rupture of ear drum, bilateral: Secondary | ICD-10-CM

## 2022-02-16 MED ORDER — GUAIFENESIN 400 MG PO TABS: ORAL_TABLET | ORAL | 0 refills | Status: AC

## 2022-02-16 MED ORDER — CEFDINIR 250 MG/5ML PO SUSR: 300.0000 mg | Freq: Two times a day (BID) | ORAL | 0 refills | Status: AC

## 2022-02-16 MED ORDER — CEFDINIR 300 MG PO CAPS: 300.0000 mg | ORAL_CAPSULE | Freq: Two times a day (BID) | ORAL | 0 refills | Status: AC

## 2022-02-16 MED ORDER — ALBUTEROL SULFATE HFA 108 (90 BASE) MCG/ACT IN AERS: 2.0000 | INHALATION_SPRAY | Freq: Four times a day (QID) | RESPIRATORY_TRACT | 5 refills | Status: AC | PRN

## 2022-02-16 MED ORDER — IBUPROFEN 400 MG PO TABS: 400.0000 mg | ORAL_TABLET | Freq: Three times a day (TID) | ORAL | 0 refills | Status: AC | PRN

## 2022-02-16 MED ORDER — FLUTICASONE PROPIONATE 50 MCG/ACT NA SUSP: 1.0000 | Freq: Every day | NASAL | 2 refills | Status: DC

## 2022-02-16 MED ORDER — CETIRIZINE HCL 10 MG PO TABS: 10.0000 mg | ORAL_TABLET | Freq: Every day | ORAL | 0 refills | Status: DC

## 2022-02-16 NOTE — ED Triage Notes (Addendum)
Pt reports having muscle / body aches, green nasal drainage, chest congestion, otalgia, fatigue, sharp lung pain and diarrhea.   The patient states she is needing albuterol inhaler refill.   Started: 02/12/22  Home interventions: delsym, tylenol

## 2022-02-16 NOTE — Discharge Instructions (Signed)
Please read below to learn more about the medications, dosages and frequencies that I recommend to help alleviate your symptoms and to get you feeling better soon:    Omnicef (cefdinir):  1 capsule twice daily for 10 days, you can take it with or without food.  This antibiotic can cause upset stomach, this will resolve once antibiotics are complete.  You are welcome to use a probiotic, eat yogurt, take Imodium while taking this medication.  Please avoid other systemic medications such as Maalox, Pepto-Bismol or milk of magnesia as they can interfere with your body's ability to absorb the antibiotics.       Zyrtec (cetirizine): This is an excellent second-generation antihistamine that helps to reduce respiratory inflammatory response to environmental allergens.  In some patients, this medication can cause daytime sleepiness so I recommend that you take 1 tablet daily at bedtime.     Flonase (fluticasone): This is a steroid nasal spray that you use once daily, 1 spray in each nare.  This medication does not work well if you decide to use it only used as you feel you need to, it works best used on a daily basis.  After 3 to 5 days of use, you will notice significant reduction of the inflammation and mucus production that is currently being caused by exposure to allergens, whether seasonal or environmental.  The most common side effect of this medication is nosebleeds.  If you experience a nosebleed, please discontinue use for 1 week, then feel free to resume.  I have provided you with a prescription.     ProAir, Ventolin, Proventil (albuterol): This inhaled medication contains a short acting beta agonist bronchodilator.  This medication works on the smooth muscle that opens and constricts of your airways by relaxing the muscle.  The result of relaxation of the smooth muscle is increased air movement and improved work of breathing.  This is a short acting medication that can be used every 4-6 hours as needed for  increased work of breathing, shortness of breath, wheezing and excessive coughing.  I have provided you with a prescription.    Advil, Motrin (ibuprofen): This is a good anti-inflammatory medication which not only addresses aches, pains but also significantly reduces soft tissue inflammation of the upper airways that causes sinus and nasal congestion as well as inflammation of the lower airways which makes you feel like your breathing is constricted or your cough feel tight.  I recommend that you take 400 mg every 8 hours as needed.      Robitussin, Mucinex (guaifenesin): This is an expectorant.  This helps break up chest congestion and loosen up thick nasal drainage making phlegm and drainage more liquid and therefore easier to remove.  I recommend being 400 mg three times daily as needed.     Please consider purchasing a home pulse oximeter to keep track of your oxygen levels.  Please always check your oxygen level on your ring finger of either hand for the most accurate reading.  If you are unable to maintain an oxygen saturation greater than 88% while walking, please seek emergency medical attention.   Please follow-up within the next 5-7 days either with your primary care provider or urgent care if your symptoms do not resolve.  Please follow-up sooner if your symptoms have and despite the above recommendations and plan of care.  Thank you for visiting urgent care today.  We appreciate the opportunity to participate in your care.

## 2022-02-16 NOTE — ED Provider Notes (Incomplete)
[facility]    CSN: [id number] Arrival date & time: 02/16/22  1038    HISTORY   Chief Complaint  Patient presents with   Nasal Congestion   Generalized Body Aches   Fatigue   Diarrhea   Otalgia   HPI Deborah Callahan Evetta Renner is a pleasant, 61 y.o. female who presents to urgent care today. Patient complains of myalgia, arthralgia, green nasal drainage, chest congestion, bilateral ear pain, fatigue, sharp pain in her lungs and diarrhea.  Patient reports a recent history of COVID-19, bronchitis and pneumonia all since October 2023.  Patient is also requesting a refill of her albuterol, feels she is wheezing.  Patient states symptoms began 4 days ago.  Patient states has been taking Mucinex, Zicam, Delsym and Tylenol without meaningful relief.    Patient has a list of 17 allergies and contraindications, most of which causes rash, diarrhea, nausea, vomiting and some of which cause myalgia and fatigue.  Patient has normal vital signs on arrival today.  Patient reports taking multiple nutritional supplements, reports intolerance to gluten, corn, dairy, peanuts and almonds.  Patient denies known allergy or contraindication to any antibiotics.  The history is provided by the patient.   Past Medical History:  Diagnosis Date   Arthritis    degenerative cerv. spine , beginning signs of carpal tunnel syndrome- L hand     Complication of anesthesia    Cystocele    Environmental and seasonal allergies    Family history of anesthesia complication    N&V- mother & sister   GERD (gastroesophageal reflux disease)    DIET CONTROLLED   Headache(784.0)    migraines    History of hiatal hernia    History of kidney stones    Hypothyroidism    IBS (irritable bowel syndrome)    IC (interstitial cystitis)    Mild intermittent asthma    Pelvic relaxation    Pneumonia    PONV (postoperative nausea and vomiting)    Wears glasses    Patient Active Problem List   Diagnosis Date  Noted   Chronic diarrhea    Benign neoplasm of colon    Rectal bleeding    Gastroesophageal reflux disease    Bloating    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   Anxiety 06/07/2020   Bilateral carpal tunnel syndrome 06/07/2020   Constipation 06/07/2020   Generalized anxiety disorder 06/07/2020   Hypothyroidism 06/07/2020   Kidney stone 06/07/2020   Leukocytopenia 06/07/2020   Moderate recurrent major depression ([place]) 06/07/2020   Sacroiliitis, not elsewhere classified ([place]) 06/07/2020   Shoulder pain 06/07/2020   Ulnar neuropathy 06/07/2020   Dysphagia 05/23/2020   Hoarseness 05/23/2020   Myelopathy ([place]) 02/09/2019   Chest pain of uncertain etiology 58/52/7782   Pelvic relaxation 09/23/2014    Class: Present on Admission   Cervical disc disorder with radiculopathy of cervical region 04/29/2012   Chronic interstitial cystitis 05/16/2011   Hematuria 05/16/2011   Past Surgical History:  Procedure Laterality Date   ABDOMINAL HYSTERECTOMY     partial hysterectomy   ANTERIOR AND POSTERIOR REPAIR  09/23/2014   Procedure: ANTERIOR (CYSTOCELE) AND POSTERIOR REPAIR (RECTOCELE);  Surgeon: Arvella Nigh, MD;  Location: [facility];  Service: Gynecology;;   ANTERIOR CERVICAL DECOMP/DISCECTOMY FUSION N/A 05/08/2012   Procedure: ACDF C5-7  ANTERIOR CERVICAL DECOMPRESSION/DISCECTOMY FUSION 2 LEVELS;  Surgeon: Melina Schools, MD;  Location: [facility];  Service: Orthopedics;  Laterality: N/A;   ANTERIOR CERVICAL DECOMP/DISCECTOMY FUSION N/A 02/09/2019   Procedure: ANTERIOR CERVICAL  DECOMPRESSION FUSION CERVICAL 3-4, CERVICAL 4-5 WITH INSTRUMENTATION AND ALLOGRAFT;  Surgeon: Phylliss Bob, MD;  Location: [facility];  Service: Orthopedics;  Laterality: N/A;   BIOPSY  10/17/2021   Procedure: BIOPSY;  Surgeon: Yetta Flock, MD;  Location: [facility];  Service: Gastroenterology;;   COLONOSCOPY  10/19/2005   COLONOSCOPY WITH PROPOFOL N/A 10/17/2021   Procedure: COLONOSCOPY WITH PROPOFOL;   Surgeon: Yetta Flock, MD;  Location: [facility];  Service: Gastroenterology;  Laterality: N/A;   D & C HYSTEROSCOPE/ RESECTION POLYP/  POSTERIOR AND ENTEROOCELE REPAIRS/  CARDINAL UTEROSACRAL COLPOSUSPENSION /  PERINEAL BODY REPAIR  04/22/2003   ESOPHAGOGASTRODUODENOSCOPY (EGD) WITH PROPOFOL N/A 10/17/2021   Procedure: ESOPHAGOGASTRODUODENOSCOPY (EGD) WITH PROPOFOL;  Surgeon: Yetta Flock, MD;  Location: [facility];  Service: Gastroenterology;  Laterality: N/A;   FRACTURE SURGERY Right 2005   Right shoulder with Dr. Theda Sers at [facility]  11/02/2004   w/  Release posterior vaginal scar   LAPAROSCOPIC CHOLECYSTECTOMY  10/11/2001   POLYPECTOMY  10/17/2021   Procedure: POLYPECTOMY;  Surgeon: Yetta Flock, MD;  Location: [facility];  Service: Gastroenterology;;   Azzie Almas DILATION N/A 10/17/2021   Procedure: Azzie Almas DILATION;  Surgeon: Yetta Flock, MD;  Location: [facility];  Service: Gastroenterology;  Laterality: N/A;   TOOTH EXTRACTION     #31   TUBAL LIGATION  1988   OB History   No obstetric history on file.    Home Medications    Prior to Admission medications   Medication Sig Start Date End Date Taking? Authorizing Provider  albuterol (VENTOLIN HFA) 108 (90 Base) MCG/ACT inhaler Inhale 1-2 puffs into the lungs every 6 (six) hours as needed for wheezing or shortness of breath. 01/13/22   Jaynee Eagles, PA-C  Cyanocobalamin (VITAMIN B-12) 5000 MCG SUBL Take 5,000 mcg by mouth in the morning.    [provider]  dicyclomine (BENTYL) 20 MG tablet Take 1 tablet (20 mg total) by mouth every 6 (six) hours as needed (abdominal cramping pain). 08/29/21   Irene Shipper, MD  Homeopathic Products [facility] ALLERGY RELIEF) GEL Place 1 application  into the nose every other day. At night (Zicam Cold Remedy Nasal Swabs)    [provider]  levothyroxine (SYNTHROID) 88 MCG tablet Take 1 tablet (88  mcg total) by mouth daily. 11/17/21   Shamleffer, Melanie Crazier, MD  LYSINE PO Take 1 Scoop by mouth daily.    [provider]  MAGNESIUM CITRATE PO Take 500 mg by mouth daily. 250 mg/tablet    [provider]  MELATONIN PO Take 6 mg by mouth at bedtime as needed (sleep).    [provider]  Menaquinone-7 (VITAMIN K2 PO) Take 1 capsule by mouth daily. Health As It Ought To Be Vitamin K2 Mk-7 178mg    [provider]  Misc Natural Products (IMMUNE FORMULA PO) Take 1 tablet by mouth in the morning. SJellicoImmune Plus    [provider]  Multiple Vitamins-Minerals (ADULT ONE DAILY GUMMIES PO) Take 2 tablets by mouth daily. Vitafusion MultiVites    [provider]  ondansetron (ZOFRAN) 4 MG tablet Take 1 tablet (4 mg total) by mouth every 4 (four) hours as needed for nausea. 08/29/21   PIrene Shipper MD  Polyethyl Glycol-Propyl Glycol (SYSTANE) 0.4-0.3 % SOLN Place 1-2 drops into both eyes 3 (three) times daily as needed (dry/irritated eyes.).    [provider]  Polyethylene Glycol 400 (BLINK TEARS) 0.25 %  SOLN Place 1-2 drops into both eyes daily.    [provider]  predniSONE (DELTASONE) 50 MG tablet Take 1 tablet (50 mg total) by mouth daily with breakfast. 01/13/22   Jaynee Eagles, PA-C  promethazine-dextromethorphan (PROMETHAZINE-DM) 6.25-15 MG/5ML syrup Take 5 mLs by mouth 3 (three) times daily as needed for cough. 01/13/22   Jaynee Eagles, PA-C  TURMERIC-GINGER PO Take 2 tablets by mouth daily. Gummies    [provider]    Family History Family History  Problem Relation Age of Onset   Cancer Mother        breast   Arthritis/Rheumatoid Mother    Diabetes Mother    Cancer Sister        breast   Cancer Maternal Grandmother        breast   Diabetes Paternal Grandfather    Social History Social History   Tobacco Use   Smoking status: Never   Smokeless tobacco: Never  Vaping Use   Vaping Use: Never  used  Substance Use Topics   Alcohol use: Yes    Comment: OCCASIONAL WINE   Drug use: No   Allergies   Codeine, Oxycodone-acetaminophen, Soybean-containing drug products, Tramadol, Wheat bran, Almond (diagnostic), Bismuth-containing compounds, Buspar [buspirone], Carafate [sucralfate], Corn-containing products, Eggs or egg-derived products, Gluten meal, Lactose intolerance (gi), Latex, Lentil, Omeprazole, Peanuts [peanut oil], and Pepcid [famotidine]  Review of Systems Review of Systems Pertinent findings revealed after performing a 14 point review of systems has been noted in the history of present illness.  Physical Exam Triage Vital Signs ED Triage Vitals  Enc Vitals Group     BP 12/16/20 0827 (!) 147/82     Pulse Rate 12/16/20 0827 72     Resp 12/16/20 0827 18     Temp 12/16/20 0827 98.3 F (36.8 C)     Temp Source 12/16/20 0827 Oral     SpO2 12/16/20 0827 98 %     Weight --      Height --      Head Circumference --      Peak Flow --      Pain Score 12/16/20 0826 5     Pain Loc --      Pain Edu? --      Excl. in [place]? --   No data found.  Updated Vital Signs BP 133/79 (BP Location: Left Arm)   Pulse 67   Temp 98.4 F (36.9 C) (Oral)   Resp 16   SpO2 96%   Physical Exam Vitals and nursing note reviewed.  Constitutional:      General: She is awake. She is not in acute distress.    Appearance: Normal appearance. She is well-developed and well-groomed. She is not ill-appearing.  HENT:     Head: Normocephalic and atraumatic.     Salivary Glands: Right salivary gland is not diffusely enlarged or tender. Left salivary gland is not diffusely enlarged or tender.     Right Ear: Hearing, ear canal and external ear normal. No decreased hearing noted. No drainage or tenderness. A middle ear effusion is present. There is no impacted cerumen. Tympanic membrane is injected, erythematous and retracted. Tympanic membrane is not scarred, perforated or bulging.     Left Ear: Hearing,  ear canal and external ear normal. No decreased hearing noted. No drainage or tenderness. A middle ear effusion is present. There is no impacted cerumen. Tympanic membrane is injected, erythematous and retracted. Tympanic membrane is not scarred, perforated or bulging.  Nose: Rhinorrhea present. No nasal deformity, septal deviation, signs of injury, nasal tenderness, mucosal edema or congestion. Rhinorrhea is clear.     Right Nostril: No foreign body, epistaxis, septal hematoma or occlusion.     Left Nostril: No foreign body, epistaxis, septal hematoma or occlusion.     Right Turbinates: Enlarged, swollen and pale.     Left Turbinates: Enlarged, swollen and pale.     Right Sinus: No maxillary sinus tenderness or frontal sinus tenderness.     Left Sinus: No maxillary sinus tenderness or frontal sinus tenderness.     Mouth/Throat:     Lips: Pink. No lesions.     Mouth: Mucous membranes are moist. No oral lesions.     Tongue: No lesions. Tongue does not deviate from midline.     Palate: No mass and lesions.     Pharynx: Oropharynx is clear. Uvula midline. No pharyngeal swelling, oropharyngeal exudate, posterior oropharyngeal erythema or uvula swelling.     Tonsils: No tonsillar exudate. 0 on the right. 0 on the left.     Comments: Postnasal drip Eyes:     General: Lids are normal.        Right eye: No discharge.        Left eye: No discharge.     Extraocular Movements: Extraocular movements intact.     Conjunctiva/sclera: Conjunctivae normal.     Right eye: Right conjunctiva is not injected.     Left eye: Left conjunctiva is not injected.     Pupils: Pupils are equal, round, and reactive to light.  Neck:     Trachea: Trachea and phonation normal.  Cardiovascular:     Rate and Rhythm: Normal rate and regular rhythm.     Pulses: Normal pulses.     Heart sounds: Normal heart sounds, S1 normal and S2 normal. No murmur heard.    No friction rub. No gallop.  Pulmonary:     Effort:  Pulmonary effort is normal. No accessory muscle usage, prolonged expiration or respiratory distress.     Breath sounds: No stridor, decreased air movement or transmitted upper airway sounds. Examination of the right-lower field reveals decreased breath sounds and rales. Examination of the left-lower field reveals decreased breath sounds and rales. Decreased breath sounds and rales present. No wheezing or rhonchi.  Chest:     Chest wall: No tenderness.  Musculoskeletal:        General: Normal range of motion.     Cervical back: Full passive range of motion without pain, normal range of motion and neck supple. Normal range of motion.  Lymphadenopathy:     Cervical: Cervical adenopathy present.     Right cervical: Superficial cervical adenopathy and posterior cervical adenopathy present.     Left cervical: Superficial cervical adenopathy and posterior cervical adenopathy present.  Skin:    General: Skin is warm and dry.     Findings: No erythema or rash.  Neurological:     General: No focal deficit present.     Mental Status: She is alert and oriented to person, place, and time.  Psychiatric:        Mood and Affect: Mood normal.        Behavior: Behavior normal. Behavior is cooperative.     Visual Acuity Right Eye Distance:   Left Eye Distance:   Bilateral Distance:    Right Eye Near:   Left Eye Near:    Bilateral Near:     [facility] / Diagnostics / Procedures:  Radiology DG Chest 2 View  Result Date: 02/16/2022 CLINICAL DATA:  Decreased breath sounds, bibasilar rales, muscle and body aches, green nasal drainage, chest congestion, otalgia, fatigue, sharp lung pain, diarrhea EXAM: CHEST - 2 VIEW COMPARISON:  01/13/2022 FINDINGS: Normal heart size, mediastinal contours, and pulmonary vascularity. Lungs clear. No pleural effusion or pneumothorax. Prior cervical spine fusion. Mild broad-based levoconvex thoracic scoliosis. IMPRESSION: No acute abnormalities. Electronically Signed    By: Lavonia Dana M.D.   On: 02/16/2022 15:04    Procedures Procedures (including critical care time) EKG  Pending results:  Labs Reviewed - No data to display  Medications Ordered in UC: Medications - No data to display  UC Diagnoses / Final Clinical Impressions(s)   I have reviewed the triage vital signs and the nursing notes.  Pertinent labs & imaging results that were available during my care of the patient were reviewed by me and considered in my medical decision making (see chart for details).    Final diagnoses:  Non-recurrent acute suppurative otitis media of both ears without spontaneous rupture of tympanic membranes  Acute bacterial sinusitis  Eustachian tube dysfunction, bilateral  Abnormal breath sounds   *** Please see discharge instructions below for further details of plan of care as provided to patient. ED Prescriptions     Medication Sig Dispense Auth. Provider   cetirizine (ZYRTEC ALLERGY) 10 MG tablet Take 1 tablet (10 mg total) by mouth at bedtime. 90 tablet Lynden Oxford Scales, PA-C   fluticasone (FLONASE) 50 MCG/ACT nasal spray Place 1 spray into both nostrils daily. Begin by using 2 sprays in each nare daily for 3 to 5 days, then decrease to 1 spray in each nare daily. 15.8 mL Lynden Oxford Scales, PA-C   cefdinir (OMNICEF) 300 MG capsule Take 1 capsule (300 mg total) by mouth 2 (two) times daily for 10 days. 20 capsule Lynden Oxford Scales, PA-C   albuterol (VENTOLIN HFA) 108 (90 Base) MCG/ACT inhaler Inhale 2 puffs into the lungs every 6 (six) hours as needed for wheezing or shortness of breath (Cough). 18 g Lynden Oxford Scales, PA-C   ibuprofen (ADVIL) 400 MG tablet Take 1 tablet (400 mg total) by mouth every 8 (eight) hours as needed for up to 30 doses. 30 tablet Lynden Oxford Scales, PA-C   guaifenesin (HUMIBID E) 400 MG TABS tablet Take 1 tablet 3 times daily as needed for chest congestion and cough 21 tablet Lynden Oxford Scales, PA-C    cefdinir (OMNICEF) 250 MG/5ML suspension Take 6 mLs (300 mg total) by mouth 2 (two) times daily for 10 days. 120 mL Lynden Oxford Scales, PA-C      PDMP not reviewed this encounter.  Disposition Upon Discharge:  Condition: stable for discharge home Home: take medications as prescribed; routine discharge instructions as discussed; follow up as advised.  Patient presented with an acute illness with associated systemic symptoms and significant discomfort requiring urgent management. In my opinion, this is a condition that a prudent lay person (someone who possesses an average knowledge of health and medicine) may potentially expect to result in complications if not addressed urgently such as respiratory distress, impairment of bodily function or dysfunction of bodily organs.   Routine symptom specific, illness specific and/or disease specific instructions were discussed with the patient and/or caregiver at length.   As such, the patient has been evaluated and assessed, work-up was performed and treatment was provided in alignment with urgent care protocols and evidence based medicine.  Patient/parent/caregiver has been advised that  the patient may require follow up for further testing and treatment if the symptoms continue in spite of treatment, as clinically indicated and appropriate.  If the patient was tested for COVID-19, Influenza and/or RSV, then the patient/parent/guardian was advised to isolate at home pending the results of his/her diagnostic coronavirus test and potentially longer if they're positive. I have also advised pt that if his/her COVID-19 test returns positive, it's recommended to self-isolate for at least 10 days after symptoms first appeared AND until fever-free for 24 hours without fever reducer AND other symptoms have improved or resolved. Discussed self-isolation recommendations as well as instructions for household member/close contacts as per the [facility] and [place] DHHS, and also  gave patient the [facility] packet with this information.  Patient/parent/caregiver has been advised to return to the [facility] or PCP in 3-5 days if no better; to PCP or the Emergency Department if new signs and symptoms develop, or if the current signs or symptoms continue to change or worsen for further workup, evaluation and treatment as clinically indicated and appropriate  The patient will follow up with their current PCP if and as advised. If the patient does not currently have a PCP we will assist them in obtaining one.   The patient may need specialty follow up if the symptoms continue, in spite of conservative treatment and management, for further workup, evaluation, consultation and treatment as clinically indicated and appropriate.  Patient/parent/caregiver verbalized understanding and agreement of plan as discussed.  All questions were addressed during visit.  Please see discharge instructions below for further details of plan.  Discharge Instructions:   Discharge Instructions      Please read below to learn more about the medications, dosages and frequencies that I recommend to help alleviate your symptoms and to get you feeling better soon:    Omnicef (cefdinir):  1 capsule twice daily for 10 days, you can take it with or without food.  This antibiotic can cause upset stomach, this will resolve once antibiotics are complete.  You are welcome to use a probiotic, eat yogurt, take Imodium while taking this medication.  Please avoid other systemic medications such as Maalox, Pepto-Bismol or milk of magnesia as they can interfere with your body's ability to absorb the antibiotics.       Zyrtec (cetirizine): This is an excellent second-generation antihistamine that helps to reduce respiratory inflammatory response to environmental allergens.  In some patients, this medication can cause daytime sleepiness so I recommend that you take 1 tablet daily at bedtime.     Flonase (fluticasone): This is a  steroid nasal spray that you use once daily, 1 spray in each nare.  This medication does not work well if you decide to use it only used as you feel you need to, it works best used on a daily basis.  After 3 to 5 days of use, you will notice significant reduction of the inflammation and mucus production that is currently being caused by exposure to allergens, whether seasonal or environmental.  The most common side effect of this medication is nosebleeds.  If you experience a nosebleed, please discontinue use for 1 week, then feel free to resume.  I have provided you with a prescription.     ProAir, Ventolin, Proventil (albuterol): This inhaled medication contains a short acting beta agonist bronchodilator.  This medication works on the smooth muscle that opens and constricts of your airways by relaxing the muscle.  The result of relaxation of the smooth muscle is increased  air movement and improved work of breathing.  This is a short acting medication that can be used every 4-6 hours as needed for increased work of breathing, shortness of breath, wheezing and excessive coughing.  I have provided you with a prescription.    Advil, Motrin (ibuprofen): This is a good anti-inflammatory medication which not only addresses aches, pains but also significantly reduces soft tissue inflammation of the upper airways that causes sinus and nasal congestion as well as inflammation of the lower airways which makes you feel like your breathing is constricted or your cough feel tight.  I recommend that you take 400 mg every 8 hours as needed.      Robitussin, Mucinex (guaifenesin): This is an expectorant.  This helps break up chest congestion and loosen up thick nasal drainage making phlegm and drainage more liquid and therefore easier to remove.  I recommend being 400 mg three times daily as needed.     Please consider purchasing a home pulse oximeter to keep track of your oxygen levels.  Please always check your oxygen  level on your ring finger of either hand for the most accurate reading.  If you are unable to maintain an oxygen saturation greater than 88% while walking, please seek emergency medical attention.   Please follow-up within the next 5-7 days either with your primary care provider or urgent care if your symptoms do not resolve.  Please follow-up sooner if your symptoms have and despite the above recommendations and plan of care.  Thank you for visiting urgent care today.  We appreciate the opportunity to participate in your care.      This office note has been dictated using Museum/gallery curator.  Unfortunately, this method of dictation can sometimes lead to typographical or grammatical errors.  I apologize for your inconvenience in advance if this occurs.  Please do not hesitate to reach out to me if clarification is needed.

## 2022-02-16 NOTE — ED Notes (Addendum)
Rounding on patient  Interventions:   Given ginger ale. Patient made aware of wait time.

## 2022-03-02 ENCOUNTER — Other Ambulatory Visit: Payer: Self-pay | Admitting: Obstetrics and Gynecology

## 2022-03-02 ENCOUNTER — Ambulatory Visit: Payer: Self-pay | Attending: Cardiovascular Disease | Admitting: Cardiovascular Disease

## 2022-03-02 ENCOUNTER — Encounter: Payer: Self-pay | Admitting: Cardiovascular Disease

## 2022-03-02 VITALS — BP 122/76 | HR 72 | Ht 65.0 in | Wt 153.8 lb

## 2022-03-02 DIAGNOSIS — N6320 Unspecified lump in the left breast, unspecified quadrant: Secondary | ICD-10-CM

## 2022-03-02 DIAGNOSIS — M940 Chondrocostal junction syndrome [Tietze]: Secondary | ICD-10-CM

## 2022-03-02 DIAGNOSIS — Z8249 Family history of ischemic heart disease and other diseases of the circulatory system: Secondary | ICD-10-CM

## 2022-03-02 DIAGNOSIS — Z789 Other specified health status: Secondary | ICD-10-CM

## 2022-03-02 NOTE — Progress Notes (Unsigned)
Cardiology Office Note:    Date:  03/03/2022   ID:  Deborah Callahan, DOB 1960/10/06, MRN [id number]  PCP:  Inda Coke, [place] Providers Cardiologist:  Taylorann Tkach   Referring MD: Inda Coke, [place]   Chief Complaint  Patient presents with   Chest Pain   Hyperlipidemia    History of Present Illness:    Deborah Callahan is a 62 y.o. female with a hx of chest pain   She called with pleuretic CP   Had Caspian in Oct.  , pneumonia in Nov.  Bronchitis in Dec.  Flu in late December Went to her primary .   Had pleural rub  Still having some chest soreness with deep breath  .  Chest soreness was pleuretic  Worse with lying down    Still exercising regularly ,   Is concerned about her fluctuating weight .  Avoids red meat for the most part.      Family hx  Father at MI at 21,  Maternal brother died at 47 of massive MI Aunt diet of massive MI, grandmother  Brother had MI at age 63    Past Medical History:  Diagnosis Date   Arthritis    degenerative cerv. spine , beginning signs of carpal tunnel syndrome- L hand     Complication of anesthesia    Cystocele    Environmental and seasonal allergies    Family history of anesthesia complication    N&V- mother & sister   GERD (gastroesophageal reflux disease)    DIET CONTROLLED   Headache(784.0)    migraines    History of hiatal hernia    History of kidney stones    Hypothyroidism    IBS (irritable bowel syndrome)    IC (interstitial cystitis)    Mild intermittent asthma    Pelvic relaxation    Pneumonia    PONV (postoperative nausea and vomiting)    Wears glasses     Past Surgical History:  Procedure Laterality Date   ABDOMINAL HYSTERECTOMY     partial hysterectomy   ANTERIOR AND POSTERIOR REPAIR  09/23/2014   Procedure: ANTERIOR (CYSTOCELE) AND POSTERIOR REPAIR (RECTOCELE);  Surgeon: Arvella Nigh, MD;  Location: [facility];  Service: Gynecology;;    ANTERIOR CERVICAL DECOMP/DISCECTOMY FUSION N/A 05/08/2012   Procedure: ACDF C5-7  ANTERIOR CERVICAL DECOMPRESSION/DISCECTOMY FUSION 2 LEVELS;  Surgeon: Melina Schools, MD;  Location: [place];  Service: Orthopedics;  Laterality: N/A;   ANTERIOR CERVICAL DECOMP/DISCECTOMY FUSION N/A 02/09/2019   Procedure: ANTERIOR CERVICAL DECOMPRESSION FUSION CERVICAL 3-4, CERVICAL 4-5 WITH INSTRUMENTATION AND ALLOGRAFT;  Surgeon: Phylliss Bob, MD;  Location: [facility];  Service: Orthopedics;  Laterality: N/A;   BIOPSY  10/17/2021   Procedure: BIOPSY;  Surgeon: Yetta Flock, MD;  Location: [facility];  Service: Gastroenterology;;   COLONOSCOPY  10/19/2005   COLONOSCOPY WITH PROPOFOL N/A 10/17/2021   Procedure: COLONOSCOPY WITH PROPOFOL;  Surgeon: Yetta Flock, MD;  Location: [facility];  Service: Gastroenterology;  Laterality: N/A;   D & C HYSTEROSCOPE/ RESECTION POLYP/  POSTERIOR AND ENTEROOCELE REPAIRS/  CARDINAL UTEROSACRAL COLPOSUSPENSION /  PERINEAL BODY REPAIR  04/22/2003   ESOPHAGOGASTRODUODENOSCOPY (EGD) WITH PROPOFOL N/A 10/17/2021   Procedure: ESOPHAGOGASTRODUODENOSCOPY (EGD) WITH PROPOFOL;  Surgeon: Yetta Flock, MD;  Location: [facility];  Service: Gastroenterology;  Laterality: N/A;   FRACTURE SURGERY Right 2005   Right shoulder with Dr. Theda Sers at [place]  11/02/2004  w/  Release posterior vaginal scar   LAPAROSCOPIC CHOLECYSTECTOMY  10/11/2001   POLYPECTOMY  10/17/2021   Procedure: POLYPECTOMY;  Surgeon: Yetta Flock, MD;  Location: [facility];  Service: Gastroenterology;;   Azzie Almas DILATION N/A 10/17/2021   Procedure: Azzie Almas DILATION;  Surgeon: Yetta Flock, MD;  Location: [facility];  Service: Gastroenterology;  Laterality: N/A;   TOOTH EXTRACTION     #31   TUBAL LIGATION  1988    Current Medications: Current Meds  Medication Sig   albuterol (VENTOLIN HFA) 108 (90 Base) MCG/ACT inhaler  Inhale 2 puffs into the lungs every 6 (six) hours as needed for wheezing or shortness of breath (Cough).   cetirizine (ZYRTEC ALLERGY) 10 MG tablet Take 1 tablet (10 mg total) by mouth at bedtime.   Cyanocobalamin (VITAMIN B-12) 5000 MCG SUBL Take 5,000 mcg by mouth in the morning.   dicyclomine (BENTYL) 20 MG tablet Take 1 tablet (20 mg total) by mouth every 6 (six) hours as needed (abdominal cramping pain).   famotidine (PEPCID) 40 MG tablet Take 1 tablet by mouth 2 (two) times daily.   fluticasone (FLONASE) 50 MCG/ACT nasal spray Place 1 spray into both nostrils daily. Begin by using 2 sprays in each nare daily for 3 to 5 days, then decrease to 1 spray in each nare daily.   guaifenesin (HUMIBID E) 400 MG TABS tablet Take 1 tablet 3 times daily as needed for chest congestion and cough   Homeopathic Products (ZICAM ALLERGY RELIEF) GEL Place 1 application  into the nose every other day. At night (Zicam Cold Remedy Nasal Swabs)   ibuprofen (ADVIL) 400 MG tablet Take 1 tablet (400 mg total) by mouth every 8 (eight) hours as needed for up to 30 doses.   levothyroxine (SYNTHROID) 88 MCG tablet Take 1 tablet (88 mcg total) by mouth daily.   LYSINE PO Take 1 Scoop by mouth daily.   MAGNESIUM CITRATE PO Take 500 mg by mouth daily. 250 mg/tablet   MELATONIN PO Take 6 mg by mouth at bedtime as needed (sleep).   Menaquinone-7 (VITAMIN K2 PO) Take 1 capsule by mouth daily. Health As It Ought To Be Vitamin K2 Mk-7 118mg   Misc Natural Products (IMMUNE FORMULA PO) Take 1 tablet by mouth in the morning. SWest AmanaImmune Plus   Multiple Vitamins-Minerals (ADULT ONE DAILY GUMMIES PO) Take 2 tablets by mouth daily. Vitafusion MultiVites   ondansetron (ZOFRAN) 4 MG tablet Take 1 tablet (4 mg total) by mouth every 4 (four) hours as needed for nausea.   Polyethyl Glycol-Propyl Glycol (SYSTANE) 0.4-0.3 % SOLN Place 1-2 drops into both eyes 3 (three) times daily as needed (dry/irritated eyes.).   Polyethylene  Glycol 400 (BLINK TEARS) 0.25 % SOLN Place 1-2 drops into both eyes daily.   promethazine-dextromethorphan (PROMETHAZINE-DM) 6.25-15 MG/5ML syrup Take 5 mLs by mouth 3 (three) times daily as needed for cough.   TURMERIC-GINGER PO Take 2 tablets by mouth daily. Gummies     Allergies:   Codeine, Oxycodone-acetaminophen, Soybean-containing drug products, Tramadol, Wheat bran, Almond (diagnostic), Bismuth-containing compounds, Buspar [buspirone], Carafate [sucralfate], Corn-containing products, Eggs or egg-derived products, Gluten meal, Lactose intolerance (gi), Latex, Lentil, Omeprazole, Peanuts [peanut oil], and Pepcid [famotidine]   Social History   Socioeconomic History   Marital status: Married    Spouse name: CHarrie Jeans  Number of children: 2   Years of education: 16   Highest education level: Not on file  Occupational History    Comment: painter/artist  Tobacco  Use   Smoking status: Never   Smokeless tobacco: Never  Vaping Use   Vaping Use: Never used  Substance and Sexual Activity   Alcohol use: Yes    Comment: OCCASIONAL WINE   Drug use: No   Sexual activity: Not on file  Other Topics Concern   Not on file  Social History Narrative   Lives at home with husband   Retired Statistician   Two sons -- one is 68 and 16 (2022)   One son is in [place]   One son is in [place]   Five grandchildren   Social Determinants of Radio broadcast assistant Strain: Not on file  Food Insecurity: Not on file  Transportation Needs: Not on file  Physical Activity: Not on file  Stress: Not on file  Social Connections: Not on file     Family History: The patient's family history includes Arthritis/Rheumatoid in her mother; Cancer in her maternal grandmother, mother, and sister; Diabetes in her mother and paternal grandfather.  ROS:   Please see the history of present illness.     All other systems reviewed and are negative.  EKGs/Labs/Other Studies Reviewed:    The  following studies were reviewed today:   EKG: March 02, 2022: Normal sinus rhythm at 72.  Otherwise normal EKG.  Recent Labs: 09/08/2021: Hemoglobin 14.1; Platelets 218.0 11/16/2021: TSH 0.40 03/02/2022: ALT 29; BUN 11; Creatinine, Ser 0.70; Potassium 4.2; Sodium 139  Recent Lipid Panel    Component Value Date/Time   CHOL 215 (H) 03/02/2022 1053   TRIG 77 03/02/2022 1053   HDL 84 03/02/2022 1053   CHOLHDL 2.6 03/02/2022 1053   LDLCALC 118 (H) 03/02/2022 1053     Risk Assessment/Calculations:                Physical Exam:    VS:  BP 122/76   Pulse 72   Ht '5\' 5"'$  (1.651 m)   Wt 153 lb 12.8 oz (69.8 kg)   SpO2 98%   BMI 25.59 kg/m     Wt Readings from Last 3 Encounters:  03/02/22 153 lb 12.8 oz (69.8 kg)  12/18/21 150 lb (68 kg)  11/13/21 150 lb (68 kg)     GEN:  Well nourished, well developed in no acute distress HEENT: Normal NECK: No JVD; No carotid bruits LYMPHATICS: No lymphadenopathy CARDIAC: RRR, no murmurs, rubs, gallops,  mild Left rib tenderness  RESPIRATORY:  Clear to auscultation without rales, wheezing or rhonchi  ABDOMEN: Soft, non-tender, non-distended MUSCULOSKELETAL:  No edema; No deformity  SKIN: Warm and dry NEUROLOGIC:  Alert and oriented x 3 PSYCHIATRIC:  Normal affect   ASSESSMENT:    1. Statin intolerance   2. Family history of coronary artery disease   3. Costochondritis    PLAN:      1.  Chest pain: The patient presents with chest pain.  These are likely to be residual musculoskeletal pains.  She had definite pleuritic chest pain last month but these have resolved.  She has occasional pain with deep breath.  There is no pleural rub or pericardial rub.  I suspect this is some persistent costochondritis.  I recommended that she take scheduled ibuprofen or Naprosyn for the next 5 to 7 days to see if this costochondritis pain will resolve.  2.  Hyperlipidemia: We will draw lipids today.  She has a very strong family history of  premature coronary artery disease.  Will draw a lipoprotein a.  I would have a low threshold to refer her to the lipid clinic for consideration of a PCSK9 inhibitor or inclisiran if she is found to be intolerant to statins.             Medication Adjustments/Labs and Tests Ordered: Current medicines are reviewed at length with the patient today.  Concerns regarding medicines are outlined above.  Orders Placed This Encounter  Procedures   CT CARDIAC SCORING (SELF PAY ONLY)   Lipoprotein A (LPA)   ALT   Basic metabolic panel   Lipid panel   EKG 12-Lead   No orders of the defined types were placed in this encounter.   Patient Instructions  Medication Instructions:  Your physician recommends that you continue on your current medications as directed. Please refer to the Current Medication list given to you today.  *If you need a refill on your cardiac medications before your next appointment, please call your pharmacy*   Lab Work: Lipids, ALT, BMET, Lipoprotein(a) today If you have labs (blood work) drawn today and your tests are completely normal, you will receive your results only by: [place] (if you have MyChart) OR A paper copy in the mail If you have any lab test that is abnormal or we need to change your treatment, we will call you to review the results.   Testing/Procedures: Coronary Calcium Score CT Your physician has requested that you have cardiac CT. Cardiac computed tomography (CT) is a painless test that uses an x-ray machine to take clear, detailed pictures of your heart. For further information please visit HugeFiesta.tn. Please follow instruction sheet as given.  Follow-Up: At [facility], you and your health needs are our priority.  As part of our continuing mission to provide you with exceptional heart care, we have created designated Provider Care Teams.  These Care Teams include your primary Cardiologist (physician) and Advanced  Practice Providers (APPs -  Physician Assistants and Nurse Practitioners) who all work together to provide you with the care you need, when you need it.  Your next appointment:   8 week(s)  Provider:   Mertie Moores, MD     Signed, Mertie Moores, MD  03/03/2022 11:15 AM    [place]

## 2022-03-02 NOTE — Patient Instructions (Signed)
Medication Instructions:  Your physician recommends that you continue on your current medications as directed. Please refer to the Current Medication list given to you today.  *If you need a refill on your cardiac medications before your next appointment, please call your pharmacy*   Lab Work: Lipids, ALT, BMET, Lipoprotein(a) today If you have labs (blood work) drawn today and your tests are completely normal, you will receive your results only by: [place] (if you have MyChart) OR A paper copy in the mail If you have any lab test that is abnormal or we need to change your treatment, we will call you to review the results.   Testing/Procedures: Coronary Calcium Score CT Your physician has requested that you have cardiac CT. Cardiac computed tomography (CT) is a painless test that uses an x-ray machine to take clear, detailed pictures of your heart. For further information please visit HugeFiesta[facility]tn. Please follow instruction sheet as given.  Follow-Up: At [facility], you and your health needs are our priority.  As part of our continuing mission to provide you with exceptional heart care, we have created designated Provider Care Teams.  These Care Teams include your primary Cardiologist (physician) and Advanced Practice Providers (APPs -  Physician Assistants and Nurse Practitioners) who all work together to provide you with the care you need, when you need it.  Your next appointment:   8 week(s)  Provider:   Mertie Moores, MD

## 2022-03-03 LAB — BASIC METABOLIC PANEL
BUN/Creatinine Ratio: 16 (ref 12–28)
BUN: 11 mg/dL (ref 8–27)
CO2: 23 mmol/L (ref 20–29)
Calcium: 9.4 mg/dL (ref 8.7–10.3)
Chloride: 104 mmol/L (ref 96–106)
Creatinine, Ser: 0.7 mg/dL (ref 0.57–1.00)
Glucose: 103 mg/dL — ABNORMAL HIGH (ref 70–99)
Potassium: 4.2 mmol/L (ref 3.5–5.2)
Sodium: 139 mmol/L (ref 134–144)
eGFR: 98 mL/min/{1.73_m2} (ref >59)

## 2022-03-03 LAB — LIPID PANEL
Chol/HDL Ratio: 2.6 ratio (ref 0.0–4.4)
Cholesterol, Total: 215 mg/dL — ABNORMAL HIGH (ref 100–199)
HDL: 84 mg/dL (ref >39)
LDL Chol Calc (NIH): 118 mg/dL — ABNORMAL HIGH (ref 0–99)
Triglycerides: 77 mg/dL (ref 0–149)
VLDL Cholesterol Cal: 13 mg/dL (ref 5–40)

## 2022-03-03 LAB — LIPOPROTEIN A (LPA): Lipoprotein (a): 209.7 nmol/L — ABNORMAL HIGH (ref <75.0)

## 2022-03-03 LAB — ALT: ALT: 29 IU/L (ref 0–32)

## 2022-03-27 ENCOUNTER — Ambulatory Visit: Payer: Self-pay | Admitting: Physician Assistant

## 2022-04-02 ENCOUNTER — Ambulatory Visit: Payer: Self-pay | Admitting: Physician Assistant

## 2022-04-10 ENCOUNTER — Ambulatory Visit (INDEPENDENT_AMBULATORY_CARE_PROVIDER_SITE_OTHER): Payer: Self-pay | Admitting: Sports Medicine

## 2022-04-10 VITALS — BP 120/68 | Ht 65.0 in | Wt 149.0 lb

## 2022-04-10 DIAGNOSIS — M25562 Pain in left knee: Secondary | ICD-10-CM

## 2022-04-11 NOTE — Progress Notes (Signed)
   Subjective:    Patient ID: Deborah Callahan, female    DOB: 1960/11/21, 62 y.o.   MRN: [id number]  HPI complaint: Left knee pain and swelling  Patient is a very pleasant 62 year old female that comes in today complaining of acute onset left knee pain and swelling that began on February 9.  No known trauma.  She describes a sudden onset of diffuse swelling throughout the knee accompanied by pain and discoloration.  She self treated with ice and Tylenol which has helped with the swelling but not with the pain.  Her pain is primarily with activity such as weightbearing.  Does improve some with sitting.  She denies any significant problems with this knee in the past.  Pain does not radiate.  Past medical history reviewed Medications reviewed Allergies reviewed   Review of Systems As above    Objective:   Physical Exam  Well-developed, well-nourished.  No acute distress  Left knee: Good active and passive range of motion.  Trace effusion.  She is tender to palpation along the medial joint line with a positive Thessaly's.  No tenderness along the lateral joint line.  Knee is stable to valgus and varus stressing.  Negative anterior drawer, negative posterior drawer.  Negative patellar apprehension.  Neurovascularly intact distally.  MSK ultrasound of the left knee shows a tiny effusion in the suprapatellar pouch.  Visualized portion of the medial meniscus is unremarkable.  No evidence of Baker's cyst in the popliteal fossa.      Assessment & Plan:   Left knee pain likely secondary to medial compartmental DJD versus degenerative medial meniscal tear  I recommended that we treat with a body helix compression sleeve and home exercises focusing on isometric quad strengthening.  I do not feel further imaging is necessary at this time but the patient will follow-up with me again in 3 weeks and if symptoms are not improving then we will reconsider additional imaging.  She has multiple  drug allergies so we will stick with over-the-counter Tylenol as needed for pain control.  This note was dictated using Dragon naturally speaking software and may contain errors in syntax, spelling, or content which have not been identified prior to signing this note.

## 2022-04-13 ENCOUNTER — Ambulatory Visit (HOSPITAL_BASED_OUTPATIENT_CLINIC_OR_DEPARTMENT_OTHER)
Admission: RE | Admit: 2022-04-13 | Discharge: 2022-04-13 | Disposition: A | Discharge status: Home / Self Care | Payer: Self-pay | Source: Ambulatory Visit | Attending: Cardiovascular Disease | Admitting: Cardiovascular Disease

## 2022-04-13 DIAGNOSIS — Z789 Other specified health status: Secondary | ICD-10-CM | POA: Insufficient documentation

## 2022-04-13 DIAGNOSIS — M940 Chondrocostal junction syndrome [Tietze]: Secondary | ICD-10-CM | POA: Insufficient documentation

## 2022-04-13 DIAGNOSIS — Z8249 Family history of ischemic heart disease and other diseases of the circulatory system: Secondary | ICD-10-CM | POA: Insufficient documentation

## 2022-04-27 ENCOUNTER — Ambulatory Visit: Payer: Self-pay | Admitting: Cardiovascular Disease

## 2022-05-01 ENCOUNTER — Ambulatory Visit
Admission: RE | Admit: 2022-05-01 | Discharge: 2022-05-01 | Disposition: A | Discharge status: Home / Self Care | Payer: No Typology Code available for payment source | Source: Ambulatory Visit | Attending: Sports Medicine | Admitting: Sports Medicine

## 2022-05-01 ENCOUNTER — Ambulatory Visit (INDEPENDENT_AMBULATORY_CARE_PROVIDER_SITE_OTHER): Payer: Self-pay | Admitting: Sports Medicine

## 2022-05-01 VITALS — BP 112/74 | Ht 65.0 in | Wt 149.0 lb

## 2022-05-01 DIAGNOSIS — M25562 Pain in left knee: Secondary | ICD-10-CM

## 2022-05-01 DIAGNOSIS — M25561 Pain in right knee: Secondary | ICD-10-CM

## 2022-05-01 NOTE — Patient Instructions (Signed)
[facility] is the Therapist Address: [address] [place], [place], [place] [address] Phone: [phone]

## 2022-05-02 ENCOUNTER — Encounter: Payer: Self-pay | Admitting: Sports Medicine

## 2022-05-02 NOTE — Progress Notes (Signed)
   Subjective:    Patient ID: Deborah Callahan, female    DOB: 12/09/1960, 62 y.o.   MRN: [id number]  HPI  Deborah Callahan presents today for follow-up on left knee pain.  Pain has improved but not completely resolved.  She did find the body helix compression sleeve to be helpful.  In fact, she began to experience some right knee pain recently secondary to compensation and she purchased a body helix compression sleeve for that knee as well.  She is doing well with her home exercises but is asking about additional exercises to perform.  She also has a cruise coming up at the end of this week and will be cruising for 8 days.  She does take an occasional over-the-counter Advil or ibuprofen but it causes GI upset.  She denies mechanical symptoms in either knee.   Review of Systems As above    Objective:   Physical Exam  Well-developed, well-nourished.  No acute distress  Left knee: Full range of motion.  No obvious effusion.  There is some tenderness to palpation along the medial joint line.  Mildly positive Thessaly's but negative McMurray's.  No tenderness along the lateral joint line.  Knee remained stable ligamentous exam.  Neurovascularly intact distally.  Right knee: Full range of motion.  No effusion.  1+ patellofemoral crepitus.  Slight tenderness along the lateral joint line but negative McMurray's.  No tenderness along the medial joint line.  Knee is stable to ligamentous exam.  Neurovascularly intact distally.  X-rays of both knees are reviewed.  There is a paucity of degenerative changes in both knees.  No obvious effusion.  Small traction spur is seen at the superior patella of the left knee.  Nothing acute.    Assessment & Plan:   Bilateral knee pain, left greater than right, secondary to mild DJD  I encouraged the patient to continue with her body helix compression sleeves when active.  I will also refer her to physical therapy at [facility] for a single session on a  comprehensive home exercise program.  She is warned about the cost and GI effects of over-the-counter NSAIDs and I have cautioned her about continuing them with GI upset.  She understands.  She will follow-up with me as needed.  This note was dictated using Dragon naturally speaking software and may contain errors in syntax, spelling, or content which have not been identified prior to signing this note.

## 2022-06-03 ENCOUNTER — Other Ambulatory Visit: Payer: Self-pay | Admitting: Internal Medicine

## 2022-06-22 ENCOUNTER — Telehealth: Payer: Self-pay | Admitting: *Deleted

## 2022-06-22 MED ORDER — CETIRIZINE HCL 10 MG PO TABS: 10.0000 mg | ORAL_TABLET | Freq: Every day | ORAL | 1 refills | Status: AC

## 2022-06-22 NOTE — Telephone Encounter (Signed)
Pt requesting refill for Zyrtec 10 mg, filled by previous provider. Okay to fill?

## 2022-06-22 NOTE — Telephone Encounter (Signed)
Rx sent to pharmacy   

## 2022-07-04 ENCOUNTER — Ambulatory Visit: Payer: Self-pay | Admitting: Internal Medicine

## 2022-07-04 ENCOUNTER — Encounter: Payer: Self-pay | Admitting: Internal Medicine

## 2022-07-04 VITALS — BP 188/68 | HR 76 | Temp 98.6°F | Ht 65.0 in | Wt 152.0 lb

## 2022-07-04 DIAGNOSIS — R030 Elevated blood-pressure reading, without diagnosis of hypertension: Secondary | ICD-10-CM | POA: Insufficient documentation

## 2022-07-04 DIAGNOSIS — R300 Dysuria: Secondary | ICD-10-CM

## 2022-07-04 LAB — POC URINALSYSI DIPSTICK (AUTOMATED)
Bilirubin, UA: NEGATIVE
Blood, UA: NEGATIVE
Glucose, UA: NEGATIVE
Ketones, UA: NEGATIVE
Leukocytes, UA: NEGATIVE
Nitrite, UA: NEGATIVE
Protein, UA: NEGATIVE
Spec Grav, UA: <=1.005 — AB (ref 1.010–1.025)
Urobilinogen, UA: 0.2 E.U./dL — AB
pH, UA: 6 (ref 5.0–8.0)

## 2022-07-04 MED ORDER — ALENDRONATE SODIUM 70 MG PO TABS: ORAL_TABLET | ORAL | 1 refills | Status: AC

## 2022-07-04 MED ORDER — HYOSCYAMINE SULFATE 0.125 MG PO TABS: 0.1250 mg | ORAL_TABLET | ORAL | 1 refills | Status: DC | PRN

## 2022-07-04 MED ORDER — DIPHENOXYLATE-ATROPINE 2.5-0.025 MG PO TABS: 1.0000 | ORAL_TABLET | Freq: Four times a day (QID) | ORAL | 0 refills | Status: AC | PRN

## 2022-07-04 MED ORDER — CEFUROXIME AXETIL 250 MG PO TABS: 250.0000 mg | ORAL_TABLET | Freq: Two times a day (BID) | ORAL | 1 refills | Status: DC

## 2022-07-04 MED ORDER — METAMUCIL SMOOTH TEXTURE 58.6 % PO POWD: 1.0000 | Freq: Three times a day (TID) | ORAL | 12 refills | Status: DC

## 2022-07-04 MED ORDER — FLUCONAZOLE 150 MG PO TABS: 150.0000 mg | ORAL_TABLET | Freq: Once | ORAL | 0 refills | Status: AC
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
## 2022-07-04 NOTE — Assessment & Plan Note (Addendum)
No h/o HTN Re-check BP at home

## 2022-07-04 NOTE — Assessment & Plan Note (Signed)
?  UTI Empiric Ceftin

## 2022-07-05 ENCOUNTER — Ambulatory Visit: Payer: Self-pay | Admitting: Physician Assistant

## 2022-07-05 ENCOUNTER — Ambulatory Visit: Payer: Self-pay | Admitting: Cardiovascular Disease

## 2022-07-27 ENCOUNTER — Encounter: Payer: Self-pay | Admitting: Nurse Practitioner

## 2022-07-27 ENCOUNTER — Ambulatory Visit (INDEPENDENT_AMBULATORY_CARE_PROVIDER_SITE_OTHER): Payer: Self-pay | Admitting: Nurse Practitioner

## 2022-07-27 VITALS — BP 114/68 | HR 74 | Ht 66.0 in | Wt 152.0 lb

## 2022-07-27 DIAGNOSIS — K5900 Constipation, unspecified: Secondary | ICD-10-CM

## 2022-07-27 DIAGNOSIS — K5641 Fecal impaction: Secondary | ICD-10-CM
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
## 2022-07-27 NOTE — Progress Notes (Signed)
Agree with assessment and plan as outlined.  

## 2022-07-27 NOTE — Patient Instructions (Addendum)
IBGuard- 1 tablet by mouth twice daily as needed for abdominal bloating  Miralax- every night as needed & may decrease to every other night if you are having too many bowel movements   We have sent a referral over to the nutritionist.  Due to recent changes in healthcare laws, you may see the results of your imaging and laboratory studies on MyChart before your provider has had a chance to review them.  We understand that in some cases there may be results that are confusing or concerning to you. Not all laboratory results come back in the same time frame and the provider may be waiting for multiple results in order to interpret others.  Please give [place] 48 hours in order for your provider to thoroughly review all the results before contacting the office for clarification of your results.   Thank you for trusting me with your gastrointestinal care!   Alcide Evener, CRNP

## 2022-07-30 ENCOUNTER — Telehealth: Payer: Self-pay | Admitting: Nurse Practitioner

## 2022-07-30 NOTE — Telephone Encounter (Signed)
Pt questioned who Alcide Evener NP recommended her to see for a nutritionist. Pt was notified that  a referral has been placed and there office will contact her in about a week or 2. Pt was give the number to Nutritional center.  Pt verbalized understanding with all questions answered.

## 2022-07-30 NOTE — Telephone Encounter (Signed)
Patient called to follow up on a referral for Nutritionist.

## 2022-07-30 NOTE — Telephone Encounter (Signed)
Noted! Thank you

## 2022-09-03 ENCOUNTER — Other Ambulatory Visit: Payer: Self-pay | Admitting: Internal Medicine

## 2022-09-05 ENCOUNTER — Encounter: Payer: Self-pay | Admitting: Family Medicine

## 2022-09-05 ENCOUNTER — Ambulatory Visit (INDEPENDENT_AMBULATORY_CARE_PROVIDER_SITE_OTHER): Payer: Self-pay | Admitting: Family Medicine

## 2022-09-05 VITALS — BP 116/74 | HR 67 | Temp 98.0°F | Resp 18 | Ht 66.0 in | Wt 151.1 lb

## 2022-09-05 DIAGNOSIS — J0100 Acute maxillary sinusitis, unspecified: Secondary | ICD-10-CM

## 2022-09-05 MED ORDER — AZITHROMYCIN 250 MG PO TABS: ORAL_TABLET | ORAL | 0 refills | Status: AC

## 2022-09-05 NOTE — Progress Notes (Signed)
Subjective:     Patient ID: Deborah Callahan, female    DOB: 1960/02/27, 62 y.o.   MRN: [id number]  Chief Complaint  Patient presents with   Sinusitis    Sinus drainage that started 1 week ago, green mucus with blood, sinus headache   Fatigue   Medication Refill    Need a refill of levothyroxine     HPI  She complains of sinus drainage, green mucus with blood, and sinus headaches, onset 1 week ago. She also reports sore throat, cough, chills, and night sweats. Unsure if she has had fever, has not checked her temperature. No vomiting, diarrhea. Has not tested for Covid.  Health Maintenance Due  Topic Date Due   HIV Screening  Never done   Hepatitis C Screening  Never done   PAP SMEAR-Modifier  Never done   MAMMOGRAM  04/14/2017    Past Medical History:  Diagnosis Date   Arthritis    degenerative cerv. spine , beginning signs of carpal tunnel syndrome- L hand     Complication of anesthesia    Cystocele    Environmental and seasonal allergies    Family history of anesthesia complication    N&V- mother & sister   GERD (gastroesophageal reflux disease)    DIET CONTROLLED   Headache(784.0)    migraines    History of hiatal hernia    History of kidney stones    Hypothyroidism    IBS (irritable bowel syndrome)    IC (interstitial cystitis)    Mild intermittent asthma    Pelvic relaxation    Pneumonia    PONV (postoperative nausea and vomiting)    Wears glasses     Past Surgical History:  Procedure Laterality Date   ABDOMINAL HYSTERECTOMY     partial hysterectomy   ANTERIOR AND POSTERIOR REPAIR  09/23/2014   Procedure: ANTERIOR (CYSTOCELE) AND POSTERIOR REPAIR (RECTOCELE);  Surgeon: Richardean Chimera, MD;  Location: [facility];  Service: Gynecology;;   ANTERIOR CERVICAL DECOMP/DISCECTOMY FUSION N/A 05/08/2012   Procedure: ACDF C5-7  ANTERIOR CERVICAL DECOMPRESSION/DISCECTOMY FUSION 2 LEVELS;  Surgeon: Venita Lick, MD;  Location: [facility];   Service: Orthopedics;  Laterality: N/A;   ANTERIOR CERVICAL DECOMP/DISCECTOMY FUSION N/A 02/09/2019   Procedure: ANTERIOR CERVICAL DECOMPRESSION FUSION CERVICAL 3-4, CERVICAL 4-5 WITH INSTRUMENTATION AND ALLOGRAFT;  Surgeon: Estill Bamberg, MD;  Location: [facility];  Service: Orthopedics;  Laterality: N/A;   BIOPSY  10/17/2021   Procedure: BIOPSY;  Surgeon: Benancio Deeds, MD;  Location: [facility];  Service: Gastroenterology;;   COLONOSCOPY  10/19/2005   COLONOSCOPY WITH PROPOFOL N/A 10/17/2021   Procedure: COLONOSCOPY WITH PROPOFOL;  Surgeon: Benancio Deeds, MD;  Location: [facility];  Service: Gastroenterology;  Laterality: N/A;   D & C HYSTEROSCOPE/ RESECTION POLYP/  POSTERIOR AND ENTEROOCELE REPAIRS/  CARDINAL UTEROSACRAL COLPOSUSPENSION /  PERINEAL BODY REPAIR  04/22/2003   ESOPHAGOGASTRODUODENOSCOPY (EGD) WITH PROPOFOL N/A 10/17/2021   Procedure: ESOPHAGOGASTRODUODENOSCOPY (EGD) WITH PROPOFOL;  Surgeon: Benancio Deeds, MD;  Location: [facility];  Service: Gastroenterology;  Laterality: N/A;   FRACTURE SURGERY Right 2005   Right shoulder with Dr. Thomasena Edis at [facility]   LAPAROSCOPIC ASSISTED VAGINAL HYSTERECTOMY  11/02/2004   w/  Release posterior vaginal scar   LAPAROSCOPIC CHOLECYSTECTOMY  10/11/2001   POLYPECTOMY  10/17/2021   Procedure: POLYPECTOMY;  Surgeon: Benancio Deeds, MD;  Location: [facility];  Service: Gastroenterology;;   Gaspar Bidding DILATION N/A 10/17/2021   Procedure: Gaspar Bidding DILATION;  Surgeon: Ileene Patrick  P, MD;  Location: [facility];  Service: Gastroenterology;  Laterality: N/A;   TOOTH EXTRACTION     #31   TUBAL LIGATION  1988     Current Outpatient Medications:    albuterol (VENTOLIN HFA) 108 (90 Base) MCG/ACT inhaler, Inhale 2 puffs into the lungs every 6 (six) hours as needed for wheezing or shortness of breath (Cough)., Disp: 18 g, Rfl: 5   alendronate (FOSAMAX) 70 MG tablet, TAKE 1 TABLET BY MOUTH ONCE A WEEK IN THE  MORNING, Disp: 12 tablet, Rfl: 1   azithromycin (ZITHROMAX) 250 MG tablet, Take 2 tablets on day 1, then 1 tablet daily on days 2 through 5, Disp: 6 tablet, Rfl: 0   cetirizine (ZYRTEC ALLERGY) 10 MG tablet, Take 1 tablet (10 mg total) by mouth at bedtime., Disp: 90 tablet, Rfl: 1   citalopram (CELEXA) 10 MG tablet, Take 10 mg by mouth daily., Disp: , Rfl:    Cyanocobalamin (VITAMIN B-12) 5000 MCG SUBL, Take 5,000 mcg by mouth in the morning., Disp: , Rfl:    dicyclomine (BENTYL) 20 MG tablet, TAKE 1 TABLET BY MOUTH EVERY 6 HOURS AS NEEDED (ABDOMINAL CRAMPING PAIN)., Disp: 30 tablet, Rfl: 0   diphenoxylate-atropine (LOMOTIL) 2.5-0.025 MG tablet, Take 1 tablet by mouth 4 (four) times daily as needed for diarrhea or loose stools., Disp: 40 tablet, Rfl: 0   famotidine (PEPCID) 40 MG tablet, Take 1 tablet by mouth 2 (two) times daily., Disp: , Rfl:    fluticasone (FLONASE) 50 MCG/ACT nasal spray, Place 1 spray into both nostrils daily. Begin by using 2 sprays in each nare daily for 3 to 5 days, then decrease to 1 spray in each nare daily., Disp: 15.8 mL, Rfl: 2   guaifenesin (HUMIBID E) 400 MG TABS tablet, Take 1 tablet 3 times daily as needed for chest congestion and cough, Disp: 21 tablet, Rfl: 0   Homeopathic Products (ZICAM ALLERGY RELIEF) GEL, Place 1 application  into the nose every other day. At night (Zicam Cold Remedy Nasal Swabs), Disp: , Rfl:    hyoscyamine (LEVSIN) 0.125 MG tablet, Take 1-2 tablets (0.125-0.25 mg total) by mouth every 4 (four) hours as needed for bladder spasms or cramping., Disp: 60 tablet, Rfl: 1   ibuprofen (ADVIL) 400 MG tablet, Take 1 tablet (400 mg total) by mouth every 8 (eight) hours as needed for up to 30 doses., Disp: 30 tablet, Rfl: 0   levothyroxine (SYNTHROID) 88 MCG tablet, Take 1 tablet by mouth once daily, Disp: 90 tablet, Rfl: 0   LYSINE PO, Take 1 Scoop by mouth daily., Disp: , Rfl:    MAGNESIUM CITRATE PO, Take 500 mg by mouth daily. 250 mg/tablet, Disp: ,  Rfl:    MELATONIN PO, Take 6 mg by mouth at bedtime as needed (sleep)., Disp: , Rfl:    Menaquinone-7 (VITAMIN K2 PO), Take 1 capsule by mouth daily. Health As It Ought To Be Vitamin K2 Mk-7 , Disp: , Rfl:    Misc Natural Products (IMMUNE FORMULA PO), Take 1 tablet by mouth in the morning. [place] Immune Plus, Disp: , Rfl:    Multiple Vitamins-Minerals (ADULT ONE DAILY GUMMIES PO), Take 2 tablets by mouth daily. Vitafusion MultiVites, Disp: , Rfl:    ondansetron (ZOFRAN) 4 MG tablet, Take 1 tablet (4 mg total) by mouth every 4 (four) hours as needed for nausea., Disp: 30 tablet, Rfl: 0   Polyethyl Glycol-Propyl Glycol (SYSTANE) 0.4-0.3 % SOLN, Place 1-2 drops into both eyes 3 (three) times  daily as needed (dry/irritated eyes.)., Disp: , Rfl:    Polyethylene Glycol 400 (BLINK TEARS) 0.25 % SOLN, Place 1-2 drops into both eyes daily., Disp: , Rfl:    psyllium (METAMUCIL SMOOTH TEXTURE) 58.6 % powder, Take 1 packet by mouth 3 (three) times daily., Disp: 283 g, Rfl: 12   TURMERIC-GINGER PO, Take 2 tablets by mouth daily. Gummies, Disp: , Rfl:   Allergies  Allergen Reactions   Codeine Anaphylaxis, Hives and Itching   Oxycodone-Acetaminophen Hives, Shortness Of Breath, Itching and Swelling   Soybean-Containing Drug Products Hives, Nausea And Vomiting and Swelling   Tramadol Hives, Nausea And Vomiting and Swelling   Wheat Anaphylaxis, Hives, Diarrhea and Swelling   Almond (Diagnostic) Hives, Diarrhea and Nausea And Vomiting   Bismuth-Containing Compounds Other (See Comments)    Ulcer (pt to avoid due to md instruction)   Buspar [Buspirone] Hives and Itching    Bleeding in scalp   Carafate [Sucralfate] Hives, Itching and Nausea And Vomiting    Bleeding in scalp, headaches   Corn-Containing Products Diarrhea   Egg-Derived Products Hives, Diarrhea and Nausea And Vomiting    Tolerated propofol on multiple occasions with no difficulty   Gluten Meal     Other reaction(s): Unknown    Lactose Intolerance (Gi) Hives, Diarrhea and Nausea And Vomiting   Latex Itching and Other (See Comments)    Skin irritation/ reddness   Lentil Diarrhea and Swelling    Bowel irritation    Omeprazole Hives and Other (See Comments)    Multiple intolerances -- decrease in energy, headaches, neuropathy in hands, throat irritation, changes in appetite, muscle aches, itchiness accompanied by widespread tiny blisters over her body and in her mouth   Peanuts [Peanut Oil] Hives, Diarrhea and Nausea And Vomiting   Pepcid [Famotidine] Other (See Comments)    Unknown    ROS neg/noncontributory except as noted HPI/below      Objective:     BP 116/74   Pulse 67   Temp 98 F (36.7 C) (Temporal)   Resp 18   Ht 5\' 6"  (1.676 m)   Wt 151 lb 2 oz (68.5 kg)   SpO2 95%   BMI 24.39 kg/m  Wt Readings from Last 3 Encounters:  09/05/22 151 lb 2 oz (68.5 kg)  07/27/22 152 lb (68.9 kg)  07/04/22 152 lb (68.9 kg)    Physical Exam   Gen: WDWN NAD HEENT: NCAT, conjunctiva not injected, sclera nonicteric. TM WNL B, OP moist, no exudates +All sinuses tender.  NECK:  supple, no thyromegaly, no nodes, no carotid bruits CARDIAC: RRR, S1S2+, no murmur.  LUNGS: CTAB. No wheezes EXT:  no edema MSK: no gross abnormalities.  NEURO: A&O x3.  CN II-XII intact.  PSYCH: normal mood. Good eye contact     Assessment & Plan:  Acute non-recurrent maxillary sinusitis  Other orders -     Azithromycin; Take 2 tablets on day 1, then 1 tablet daily on days 2 through 5  Dispense: 6 tablet; Refill: 0  1.  Sinusitis-will prescribe Z-Pak per patient request.  Discussed that this might of started out as COVID, however given that it is 7 to 8 days after symptoms started, will not test.  Return if symptoms worsen or fail to improve.   I,Rachel Rivera,acting as a scribe for Angelena Sole, MD.,have documented all relevant documentation on the behalf of Angelena Sole, MD,as directed by  Angelena Sole, MD while in the presence  of Angelena Sole, MD.  IAngelena Sole, MD, have reviewed all documentation for this visit. The documentation on 09/05/22 for the exam, diagnosis, procedures, and orders are all accurate and complete.   Angelena Sole, MD

## 2022-09-05 NOTE — Patient Instructions (Signed)
Zpk sent to pharm

## 2022-09-06 ENCOUNTER — Ambulatory Visit: Payer: Self-pay | Admitting: Family

## 2022-10-15 ENCOUNTER — Ambulatory Visit (INDEPENDENT_AMBULATORY_CARE_PROVIDER_SITE_OTHER): Payer: Self-pay | Admitting: Nurse Practitioner

## 2022-10-15 ENCOUNTER — Encounter: Payer: Self-pay | Admitting: Nurse Practitioner

## 2022-10-15 ENCOUNTER — Other Ambulatory Visit (INDEPENDENT_AMBULATORY_CARE_PROVIDER_SITE_OTHER): Payer: Self-pay

## 2022-10-15 ENCOUNTER — Telehealth: Payer: Self-pay | Admitting: Nurse Practitioner

## 2022-10-15 VITALS — BP 124/70 | HR 65 | Ht 66.0 in | Wt 153.0 lb

## 2022-10-15 DIAGNOSIS — R300 Dysuria: Secondary | ICD-10-CM

## 2022-10-15 DIAGNOSIS — R1031 Right lower quadrant pain: Secondary | ICD-10-CM

## 2022-10-15 DIAGNOSIS — R197 Diarrhea, unspecified: Secondary | ICD-10-CM

## 2022-10-15 LAB — CBC WITH DIFFERENTIAL/PLATELET
Basophils Absolute: 0 10*3/uL (ref 0.0–0.1)
Basophils Relative: 0.5 % (ref 0.0–3.0)
Eosinophils Absolute: 0.1 10*3/uL (ref 0.0–0.7)
Eosinophils Relative: 3 % (ref 0.0–5.0)
HCT: 42.5 % (ref 36.0–46.0)
Hemoglobin: 14 g/dL (ref 12.0–15.0)
Lymphocytes Relative: 38.7 % (ref 12.0–46.0)
Lymphs Abs: 1.6 10*3/uL (ref 0.7–4.0)
MCHC: 33 g/dL (ref 30.0–36.0)
MCV: 94.7 fl (ref 78.0–100.0)
Monocytes Absolute: 0.4 10*3/uL (ref 0.1–1.0)
Monocytes Relative: 8.8 % (ref 3.0–12.0)
Neutro Abs: 2 10*3/uL (ref 1.4–7.7)
Neutrophils Relative %: 49 % (ref 43.0–77.0)
Platelets: 206 10*3/uL (ref 150.0–400.0)
RBC: 4.49 Mil/uL (ref 3.87–5.11)
RDW: 12.7 % (ref 11.5–15.5)
WBC: 4 10*3/uL (ref 4.0–10.5)

## 2022-10-15 LAB — COMPREHENSIVE METABOLIC PANEL
ALT: 18 U/L (ref 0–35)
AST: 18 U/L (ref 0–37)
Albumin: 4.3 g/dL (ref 3.5–5.2)
Alkaline Phosphatase: 38 U/L — ABNORMAL LOW (ref 39–117)
BUN: 19 mg/dL (ref 6–23)
CO2: 28 mEq/L (ref 19–32)
Calcium: 9.4 mg/dL (ref 8.4–10.5)
Chloride: 104 mEq/L (ref 96–112)
Creatinine, Ser: 1.81 mg/dL — ABNORMAL HIGH (ref 0.40–1.20)
GFR: 29.56 mL/min — ABNORMAL LOW (ref >60.00)
Glucose, Bld: 100 mg/dL — ABNORMAL HIGH (ref 70–99)
Potassium: 4.4 mEq/L (ref 3.5–5.1)
Sodium: 140 mEq/L (ref 135–145)
Total Bilirubin: 0.4 mg/dL (ref 0.2–1.2)
Total Protein: 7.2 g/dL (ref 6.0–8.3)

## 2022-10-15 LAB — URINALYSIS, ROUTINE W REFLEX MICROSCOPIC
Bilirubin Urine: NEGATIVE
Ketones, ur: NEGATIVE
Leukocytes,Ua: NEGATIVE
Nitrite: NEGATIVE
Specific Gravity, Urine: 1.01 (ref 1.000–1.030)
Total Protein, Urine: NEGATIVE
Urine Glucose: NEGATIVE
Urobilinogen, UA: 0.2 (ref 0.0–1.0)
pH: 6.5 (ref 5.0–8.0)

## 2022-10-15 LAB — C-REACTIVE PROTEIN: CRP: <1 mg/dL (ref 0.5–20.0)

## 2022-10-15 NOTE — Telephone Encounter (Signed)
Called pt to verify if she had insurance, left msg on pt's vm

## 2022-10-15 NOTE — Progress Notes (Signed)
Agree with assessment and plan as outlined.  

## 2022-10-15 NOTE — Patient Instructions (Addendum)
Your provider has requested that you go to the basement level for lab work before leaving today. Press "B" on the elevator. The lab is located at the first door on the left as you exit the elevator.  You have been scheduled for a CT scan of the abdomen and pelvis at [facility], 1st floor Radiology. You are scheduled on 10/19/22 at 5:00 pm. You should arrive 15 minutes prior to your appointment time for registration.    If you have any questions regarding your exam or if you need to reschedule, you may call [facility] at [phone] between the hours of 8:00 am and 5:00 pm, Monday-Friday.   Your provider has ordered "Diatherix" stool testing for you. You have received a kit from our office today containing all necessary supplies to complete this test. Please carefully read the stool collection instructions provided in the kit before opening the accompanying materials. In addition, be sure to place the label from the top left corner of the laboratory request sheet onto the "puritan opti-swab" tube that is supplied in the kit. This label should include your full name and date of birth. After completing the test, you should secure the purtian tube into the specimen biohazard bag. The laboratory request information sheet (including date and time of specimen collection) should be placed into the outside pocket of the specimen biohazard bag and returned to the [facility] lab with 2 days of collection.  Dicyclomine 10 mg- take 1 by mouth every 8 hours as needed for abdominal pain  Go to the ED if you develop severe pain.  Due to recent changes in healthcare laws, you may see the results of your imaging and laboratory studies on MyChart before your provider has had a chance to review them.  We understand that in some cases there may be results that are confusing or concerning to you. Not all laboratory results come back in the same time frame and the provider may be waiting for multiple results  in order to interpret others.  Please give [place] 48 hours in order for your provider to thoroughly review all the results before contacting the office for clarification of your results.   Thank you for trusting me with your gastrointestinal care!   Alcide Evener, CRNP

## 2022-10-15 NOTE — Progress Notes (Signed)
10/15/2022 Deborah Callahan Deborah Callahan [id number] [birth year]   Chief Complaint: Abdominal pain, diarrhea   History of Present Illness: Deborah Callahan. [name] is a 62 year old female with a past medical history of arthritis, hypothyroidism, migraine headaches, kidney stones, interstitial cystitis, hiatal hernia and GERD. She is followed by Dr. Adela Lank.    She presents today for further evaluation regarding regarding RLQ stabbing pain with diarrhea. She endorsed feeling quite well and was passing normal formed bowel movements until Friday 10/12/2022.  At that time, she was at [facility] and she ate chicken salad she made at home which was kept cold in a cooler and a few hours later she developed stabbing RLQ pain. No N/V or fevers. She developed diarrhea Sunday 10/14/2022 with continued RUQ pain.  She described passing 6 episodes of diarrhea yesterday and 1 episode had a small amount of blood mixed in it.  She passed 3 nonbloody diarrhea bowel movements thus far today.  She took Dicyclomine and Gas-X and her RLQ pain has diminished but has not abated.  She endorses having difficulty urinating with dysuria.  As previously reviewed in her office visit 07/27/2022, she developed generalized abdominal pain with constipation when seen her son in [place] 07/13/2022.  A CTAP without oral contrast done in the ED at that time showed significant fecal material distending the redundant ascending colon/cecum, nonspecific enteritis without evidence of appendicitis, diverticulitis or bowel obstruction. Several low-attenuation nonenhancing hepatic cysts were noted and the pancreas was normal.   She underwent a colonoscopy 10/17/2021 which identified 5 tubular adenomatous polyps removed from the colon.  A repeat colonoscopy in 3 years was recommended.        Latest Ref Rng & Units 09/08/2021    7:57 AM 12/07/2020    8:52 AM 02/08/2020    1:44 PM  CBC  WBC 4.0 - 10.5 K/uL 3.8  4.7  4.4   Hemoglobin 12.0 - 15.0 g/dL  29.5  28.4  13.2   Hematocrit 36.0 - 46.0 % 41.5  43.4  42.2   Platelets 150.0 - 400.0 K/uL 218.0  227.0  232        Latest Ref Rng & Units 03/02/2022   10:53 AM 09/08/2021    7:57 AM 12/07/2020    8:52 AM  CMP  Glucose 70 - 99 mg/dL 440  93  102   BUN 8 - 27 mg/dL 11  11  16    Creatinine 0.57 - 1.00 mg/dL 7.25  3.66  4.40   Sodium 134 - 144 mmol/L 139  141  139   Potassium 3.5 - 5.2 mmol/L 4.2  3.7  4.0   Chloride 96 - 106 mmol/L 104  104  104   CO2 20 - 29 mmol/L 23  28  27    Calcium 8.7 - 10.3 mg/dL 9.4  9.6  9.8   ALT 0 - 32 IU/L 29       EGD 10/17/2021: - Esophagogastric landmarks identified.  - 1 cm hiatal hernia.  - Normal esophagus otherwise  - empiric dilation performed to 16mm, biopsies taken to rule out EoE.  - Normal stomach. Biopsied.  - Normal examined duodenum. Biopsied.   Colonoscopy 10/17/2021: - The examined portion of the ileum was normal.  - One diminutive polyp in the cecum, removed with a cold biopsy forceps. Resected and retrieved.  - One diminutive polyp in the ascending colon, removed with a cold biopsy forceps. Resected and retrieved.  - Two 3 to 5 mm  polyps in the ascending colon, removed with a cold snare. Resected and retrieved.  - One 4 mm polyp in the transverse colon, removed with a cold snare. Resected and retrieved.  - Tortuous colon.  - Internal hemorrhoids.  - The examination was otherwise normal. -  Biopsies were taken with a cold forceps from the right colon, left colon and transverse colon for evaluation of microscopic colitis. -Recall colonoscopy 3 years   A. DUODENUM, BIOPSY: - Duodenal mucosa with focal foveolar metaplasia - Negative for increased intraepithelial lymphocytes or villous architectural changes  B. STOMACH, ANTRUM, BODY, BIOPSY: - Gastric antral mucosa with mild nonspecific reactive gastropathy - Gastric oxyntic mucosa with no specific histopathologic changes - Helicobacter pylori-like organisms are not identified on  routine HE stain  C. ESOPHAGUS, BIOPSY: - Esophageal squamous mucosa with mild vascular congestion, and focal squamous ballooning, suggestive of reflux esophagitis - Negative for increased intraepithelial eosinophils  D. COLON, CECUM, ASCENDING, TRANSVERSE, POLYPECTOMY: - Tubular adenoma(s) - Negative for high-grade dysplasia or malignancy  E. COLON, RANDOM, BIOPSY: - Colonic mucosa with no specific histopathologic changes - Negative for acute inflammation, increased intraepithelial lymphocytes or thickened subepithelial collagen table   Current Outpatient Medications on File Prior to Visit  Medication Sig Dispense Refill   alendronate (FOSAMAX) 70 MG tablet TAKE 1 TABLET BY MOUTH ONCE A WEEK IN THE MORNING 12 tablet 1   citalopram (CELEXA) 10 MG tablet Take 10 mg by mouth daily.     dicyclomine (BENTYL) 20 MG tablet TAKE 1 TABLET BY MOUTH EVERY 6 HOURS AS NEEDED (ABDOMINAL CRAMPING PAIN). 30 tablet 0   hyoscyamine (LEVSIN) 0.125 MG tablet Take 1-2 tablets (0.125-0.25 mg total) by mouth every 4 (four) hours as needed for bladder spasms or cramping. 60 tablet 1   levothyroxine (SYNTHROID) 88 MCG tablet Take 1 tablet by mouth once daily 90 tablet 0   OVER THE COUNTER MEDICATION      OVER THE COUNTER MEDICATION Pt taking miralax every day     Polyethylene Glycol 400 (BLINK TEARS) 0.25 % SOLN Place 1-2 drops into both eyes daily.     psyllium (METAMUCIL SMOOTH TEXTURE) 58.6 % powder Take 1 packet by mouth 3 (three) times daily. 283 g 12   TURMERIC-GINGER PO Take 2 tablets by mouth daily. Gummies     albuterol (VENTOLIN HFA) 108 (90 Base) MCG/ACT inhaler Inhale 2 puffs into the lungs every 6 (six) hours as needed for wheezing or shortness of breath (Cough). (Patient not taking: Reported on 10/15/2022) 18 g 5   cetirizine (ZYRTEC ALLERGY) 10 MG tablet Take 1 tablet (10 mg total) by mouth at bedtime. (Patient not taking: Reported on 10/15/2022) 90 tablet 1   Cyanocobalamin (VITAMIN B-12) 5000  MCG SUBL Take 5,000 mcg by mouth in the morning. (Patient not taking: Reported on 10/15/2022)     diphenoxylate-atropine (LOMOTIL) 2.5-0.025 MG tablet Take 1 tablet by mouth 4 (four) times daily as needed for diarrhea or loose stools. (Patient not taking: Reported on 10/15/2022) 40 tablet 0   famotidine (PEPCID) 40 MG tablet Take 1 tablet by mouth 2 (two) times daily. (Patient not taking: Reported on 10/15/2022)     fluticasone (FLONASE) 50 MCG/ACT nasal spray Place 1 spray into both nostrils daily. Begin by using 2 sprays in each nare daily for 3 to 5 days, then decrease to 1 spray in each nare daily. (Patient not taking: Reported on 10/15/2022) 15.8 mL 2   guaifenesin (HUMIBID E) 400 MG TABS tablet Take 1 tablet  3 times daily as needed for chest congestion and cough (Patient not taking: Reported on 10/15/2022) 21 tablet 0   Homeopathic Products (ZICAM ALLERGY RELIEF) GEL Place 1 application  into the nose every other day. At night (Zicam Cold Remedy Nasal Swabs) (Patient not taking: Reported on 10/15/2022)     ibuprofen (ADVIL) 400 MG tablet Take 1 tablet (400 mg total) by mouth every 8 (eight) hours as needed for up to 30 doses. (Patient not taking: Reported on 10/15/2022) 30 tablet 0   LYSINE PO Take 1 Scoop by mouth daily. (Patient not taking: Reported on 10/15/2022)     MAGNESIUM CITRATE PO Take 500 mg by mouth daily. 250 mg/tablet (Patient not taking: Reported on 10/15/2022)     MELATONIN PO Take 6 mg by mouth at bedtime as needed (sleep). (Patient not taking: Reported on 10/15/2022)     Menaquinone-7 (VITAMIN K2 PO) Take 1 capsule by mouth daily. Health As It Ought To Be Vitamin K2 Mk-7 (Patient not taking: Reported on 10/15/2022)     Misc Natural Products (IMMUNE FORMULA PO) Take 1 tablet by mouth in the morning. [place] Immune Plus (Patient not taking: Reported on 10/15/2022)     Multiple Vitamins-Minerals (ADULT ONE DAILY GUMMIES PO) Take 2 tablets by mouth daily. Vitafusion MultiVites  (Patient not taking: Reported on 10/15/2022)     ondansetron (ZOFRAN) 4 MG tablet Take 1 tablet (4 mg total) by mouth every 4 (four) hours as needed for nausea. (Patient not taking: Reported on 10/15/2022) 30 tablet 0   Polyethyl Glycol-Propyl Glycol (SYSTANE) 0.4-0.3 % SOLN Place 1-2 drops into both eyes 3 (three) times daily as needed (dry/irritated eyes.). (Patient not taking: Reported on 10/15/2022)     No current facility-administered medications on file prior to visit.   Allergies  Allergen Reactions   Codeine Anaphylaxis, Hives and Itching   Oxycodone-Acetaminophen Hives, Shortness Of Breath, Itching and Swelling   Soybean-Containing Drug Products Hives, Nausea And Vomiting and Swelling   Tramadol Hives, Nausea And Vomiting and Swelling   Wheat Anaphylaxis, Hives, Diarrhea and Swelling   Almond (Diagnostic) Hives, Diarrhea and Nausea And Vomiting   Bismuth-Containing Compounds Other (See Comments)    Ulcer (pt to avoid due to md instruction)   Buspar [Buspirone] Hives and Itching    Bleeding in scalp   Carafate [Sucralfate] Hives, Itching and Nausea And Vomiting    Bleeding in scalp, headaches   Corn-Containing Products Diarrhea   Egg-Derived Products Hives, Diarrhea and Nausea And Vomiting    Tolerated propofol on multiple occasions with no difficulty   Gluten Meal     Other reaction(s): Unknown   Lactose Intolerance (Gi) Hives, Diarrhea and Nausea And Vomiting   Latex Itching and Other (See Comments)    Skin irritation/ reddness   Lentil Diarrhea and Swelling    Bowel irritation    Omeprazole Hives and Other (See Comments)    Multiple intolerances -- decrease in energy, headaches, neuropathy in hands, throat irritation, changes in appetite, muscle aches, itchiness accompanied by widespread tiny blisters over her body and in her mouth   Peanuts [Peanut Oil] Hives, Diarrhea and Nausea And Vomiting   Pepcid [Famotidine] Other (See Comments)    Unknown    Current Medications,  Allergies, Past Medical History, Past Surgical History, Family History and Social History were reviewed in [employer] record.  Review of Systems:   Constitutional: Negative for fever, sweats, chills or weight loss.  Respiratory: Negative for shortness of breath.  Cardiovascular: Negative for chest pain, palpitations and leg swelling.  Gastrointestinal: See HPI.  Urinary: + Chronic interstitial cystitis Musculoskeletal: Negative for back pain or muscle aches.  Neurological: Negative for dizziness, headaches or paresthesias.   Physical Exam: BP 124/70   Pulse 65   Ht 5\' 6"  (1.676 m)   Wt 153 lb (69.4 kg)   BMI 24.69 kg/m   General: 62 year old female in no acute distress. Head: Normocephalic and atraumatic. Eyes: No scleral icterus. Conjunctiva pink . Ears: Normal auditory acuity. Mouth: Dentition intact. No ulcers or lesions.  Lungs: Clear throughout to auscultation. Heart: Regular rate and rhythm, no murmur. Abdomen: Soft, nondistended.  Periumbilical and RLQ tenderness without rebound or guarding.  No masses or hepatomegaly. Normal bowel sounds x 4 quadrants.  Rectal: Deferred. Musculoskeletal: Symmetrical with no gross deformities. Extremities: No edema. Neurological: Alert oriented x 4. No focal deficits.  Psychological: Alert and cooperative. Normal mood and affect  Assessment and Recommendations:  62 year old female with acute onset RLQ pain and diarrhea.  -CBC, CMP, CRP -Diatherix stool culture including C. Difficile -CTAP with oral and IV contrast, BUN/creatinine to be reviewed prior to seeing with CTAP -Push fluids, bland diet -Dicyclomine 10 mg 1 p.o. every 8 hours as needed for abdominal pain -Patient instructed go to the emergency room if she develops severe abdominal pain excessive diarrhea  Dysuria -Urinalysis with reflex to culture -Patient informed if urinalysis/cx indicates UTI she will need to follow-up with her PCP for  management  History of colon polyps. Colonoscopy 10/17/2021 identified 5 tubular adenomatous polyps removed from the colon.  -Next colonoscopy due 09/2024

## 2022-10-16 ENCOUNTER — Telehealth: Payer: Self-pay | Admitting: Physician Assistant

## 2022-10-16 NOTE — Telephone Encounter (Signed)
Patient called stating she went to see Alcide Evener, NP at [facility]. States Jill Side wanted patient to make PCP aware of yesterday's visit as well as lab results.

## 2022-10-16 NOTE — Telephone Encounter (Signed)
FYI, see message. 

## 2022-10-16 NOTE — Telephone Encounter (Signed)
Spoke to pt told her per Lelon Mast,  Lab results reviewed -- please have her schedule an appointment with me to discuss and recheck blood work this week. Pt verbalized understanding. Appt scheduled for Friday 8/30 at 10:40 AM.

## 2022-10-17 ENCOUNTER — Telehealth: Payer: Self-pay

## 2022-10-17 ENCOUNTER — Ambulatory Visit: Payer: Self-pay | Admitting: Registered"

## 2022-10-17 NOTE — Telephone Encounter (Signed)
Yes, both were negative.

## 2022-10-17 NOTE — Telephone Encounter (Addendum)
Contacted pt & pt verbalized understanding of [facility] c diff stool results. Pt stated that her Ct scan had needed to be rescheduled. Radiology number was given to pt at the end of the call.

## 2022-10-17 NOTE — Telephone Encounter (Signed)
DD, pls verify, was [facility] GI pathogen panel and C. Diff result negative ?

## 2022-10-18 ENCOUNTER — Ambulatory Visit: Payer: Self-pay | Admitting: Cardiovascular Disease

## 2022-10-19 ENCOUNTER — Ambulatory Visit: Payer: Self-pay | Admitting: Physician Assistant

## 2022-10-19 ENCOUNTER — Ambulatory Visit (HOSPITAL_COMMUNITY): Payer: Self-pay

## 2022-10-24 ENCOUNTER — Ambulatory Visit: Payer: Self-pay | Admitting: Physician Assistant

## 2022-11-06 ENCOUNTER — Ambulatory Visit: Payer: Self-pay | Admitting: Gastroenterology

## 2022-11-14 ENCOUNTER — Ambulatory Visit: Payer: Self-pay | Admitting: Internal Medicine

## 2022-11-27 ENCOUNTER — Encounter: Payer: Self-pay | Admitting: Internal Medicine

## 2022-11-27 ENCOUNTER — Ambulatory Visit (INDEPENDENT_AMBULATORY_CARE_PROVIDER_SITE_OTHER): Payer: Self-pay | Admitting: Internal Medicine

## 2022-11-27 VITALS — BP 122/72 | HR 74 | Ht 66.0 in | Wt 156.0 lb

## 2022-11-27 DIAGNOSIS — E039 Hypothyroidism, unspecified: Secondary | ICD-10-CM

## 2022-11-27 LAB — T4, FREE: Free T4: 0.9 ng/dL (ref 0.60–1.60)

## 2022-11-27 LAB — TSH: TSH: 2.68 u[IU]/mL (ref 0.35–5.50)

## 2022-11-27 MED ORDER — LEVOTHYROXINE SODIUM 88 MCG PO TABS: 88.0000 ug | ORAL_TABLET | Freq: Every day | ORAL | 3 refills | Status: AC

## 2022-11-27 NOTE — Patient Instructions (Signed)

## 2022-11-27 NOTE — Progress Notes (Signed)
Name: Deborah Callahan  MRN/ DOB: [id number], [birth year]    Age/ Sex: 62 y.o., female    PCP: Jarold Motto, PA   Reason for Endocrinology Evaluation: Hypothyroidism     Date of Initial Endocrinology Evaluation: 10/26/2020    HPI: Deborah Callahan is a 62 y.o. female with a past medical history of hypothyroidism, interstitial cystitis and IBS . The patient presented for initial endocrinology clinic visit on 10/26/2020 for consultative assistance with her Hypothyroidism .   She has been diagnosed with hypothyroidism many years ago She presented to her PCP in 08/2020 with complaints of fatigue, hair loss and brittle nails.    She had a thyroid ultrasound in early 2022 due to complaints of dysphagia, and choking sensation. The ultrasound was unrevealing without nodularity . Saw ENT in 05/2020 for this.   Saw integrative doctor, was on armour thyroid which was switched to levothyroxine years ago.     Mother with thyroid disease   On her initial visit to our clinic she had a low TSH at 0.03 u IU/mL, levothyroxine dose was reduced  A year after the intake of levothyroxine she called our office stating that she is allergic to levothyroxine with vomiting and diarrhea, and asked to switch to Synthroid.   She was also noted with elevated vitamin D at 111 NG/mL, we discontinued vitamin D and vitamin D levels normalized  SUBJECTIVE:    Today (11/27/22):  Deborah Callahan is here for a follow up on hypothyroid management.    She was evaluated by GI for constipation and fecal impaction she continues to complain of abdominal discomfort Weight has been fluctuating but overall stable over the past year Denies local neck swelling , but has irritation at the scar Had palpitations that she attributes to stool impaction  Denies tremors but has discoloration of the finger tips randomly    Last Biotin intake was 2 weeks ago   Has been going through stress at    Levothyroxine 88 mcg daily  HISTORY:  Past Medical History:  Past Medical History:  Diagnosis Date   Arthritis    degenerative cerv. spine , beginning signs of carpal tunnel syndrome- L hand     Complication of anesthesia    Cystocele    Environmental and seasonal allergies    Family history of anesthesia complication    N&V- mother & sister   GERD (gastroesophageal reflux disease)    DIET CONTROLLED   Headache(784.0)    migraines    History of hiatal hernia    History of kidney stones    Hypothyroidism    IBS (irritable bowel syndrome)    IC (interstitial cystitis)    Mild intermittent asthma    Pelvic relaxation    Pneumonia    PONV (postoperative nausea and vomiting)    Wears glasses    Past Surgical History:  Past Surgical History:  Procedure Laterality Date   ABDOMINAL HYSTERECTOMY     partial hysterectomy   ANTERIOR AND POSTERIOR REPAIR  09/23/2014   Procedure: ANTERIOR (CYSTOCELE) AND POSTERIOR REPAIR (RECTOCELE);  Surgeon: Richardean Chimera, MD;  Location: [facility];  Service: Gynecology;;   ANTERIOR CERVICAL DECOMP/DISCECTOMY FUSION N/A 05/08/2012   Procedure: ACDF C5-7  ANTERIOR CERVICAL DECOMPRESSION/DISCECTOMY FUSION 2 LEVELS;  Surgeon: Venita Lick, MD;  Location: [facility] OR;  Service: Orthopedics;  Laterality: N/A;   ANTERIOR CERVICAL DECOMP/DISCECTOMY FUSION N/A 02/09/2019   Procedure: ANTERIOR CERVICAL DECOMPRESSION FUSION CERVICAL 3-4, CERVICAL 4-5 WITH INSTRUMENTATION  AND ALLOGRAFT;  Surgeon: Estill Bamberg, MD;  Location: [facility];  Service: Orthopedics;  Laterality: N/A;   BIOPSY  10/17/2021   Procedure: BIOPSY;  Surgeon: Benancio Deeds, MD;  Location: [facility];  Service: Gastroenterology;;   COLONOSCOPY  10/19/2005   COLONOSCOPY WITH PROPOFOL N/A 10/17/2021   Procedure: COLONOSCOPY WITH PROPOFOL;  Surgeon: Benancio Deeds, MD;  Location: [facility];  Service: Gastroenterology;  Laterality: N/A;   D & C HYSTEROSCOPE/ RESECTION  POLYP/  POSTERIOR AND ENTEROOCELE REPAIRS/  CARDINAL UTEROSACRAL COLPOSUSPENSION /  PERINEAL BODY REPAIR  04/22/2003   ESOPHAGOGASTRODUODENOSCOPY (EGD) WITH PROPOFOL N/A 10/17/2021   Procedure: ESOPHAGOGASTRODUODENOSCOPY (EGD) WITH PROPOFOL;  Surgeon: Benancio Deeds, MD;  Location: [facility];  Service: Gastroenterology;  Laterality: N/A;   FRACTURE SURGERY Right 2005   Right shoulder with Dr. Thomasena Edis at [facility]   LAPAROSCOPIC ASSISTED VAGINAL HYSTERECTOMY  11/02/2004   w/  Release posterior vaginal scar   LAPAROSCOPIC CHOLECYSTECTOMY  10/11/2001   POLYPECTOMY  10/17/2021   Procedure: POLYPECTOMY;  Surgeon: Benancio Deeds, MD;  Location: [facility];  Service: Gastroenterology;;   Gaspar Bidding DILATION N/A 10/17/2021   Procedure: Gaspar Bidding DILATION;  Surgeon: Benancio Deeds, MD;  Location: [facility];  Service: Gastroenterology;  Laterality: N/A;   TOOTH EXTRACTION     #31   TUBAL LIGATION  1988    Social History:  reports that she has never smoked. She has never used smokeless tobacco. She reports current alcohol use. She reports that she does not use drugs. Family History: family history includes Arthritis/Rheumatoid in her mother; Cancer in her maternal grandmother, mother, and sister; Diabetes in her mother and paternal grandfather.   HOME MEDICATIONS: Allergies as of 11/27/2022       Reactions   Codeine Anaphylaxis, Hives, Itching   Oxycodone-acetaminophen Hives, Shortness Of Breath, Itching, Swelling   Soybean-containing Drug Products Hives, Nausea And Vomiting, Swelling   Tramadol Hives, Nausea And Vomiting, Swelling   Wheat Anaphylaxis, Hives, Diarrhea, Swelling   Almond (diagnostic) Hives, Diarrhea, Nausea And Vomiting   Bismuth-containing Compounds Other (See Comments)   Ulcer (pt to avoid due to md instruction)   Buspar [buspirone] Hives, Itching   Bleeding in scalp   Carafate [sucralfate] Hives, Itching, Nausea And Vomiting   Bleeding in scalp,  headaches   Corn-containing Products Diarrhea   Egg-derived Products Hives, Diarrhea, Nausea And Vomiting   Tolerated propofol on multiple occasions with no difficulty   Gluten Meal    Other reaction(s): Unknown   Lactose Intolerance (gi) Hives, Diarrhea, Nausea And Vomiting   Latex Itching, Other (See Comments)   Skin irritation/ reddness   Lentil Diarrhea, Swelling   Bowel irritation    Omeprazole Hives, Other (See Comments)   Multiple intolerances -- decrease in energy, headaches, neuropathy in hands, throat irritation, changes in appetite, muscle aches, itchiness accompanied by widespread tiny blisters over her body and in her mouth   Peanuts [peanut Oil] Hives, Diarrhea, Nausea And Vomiting   Pepcid [famotidine] Other (See Comments)   Unknown        Medication List        Accurate as of November 27, 2022  9:43 AM. If you have any questions, ask your nurse or doctor.          ADULT ONE DAILY GUMMIES PO Take 2 tablets by mouth daily. Vitafusion MultiVites   albuterol 108 (90 Base) MCG/ACT inhaler Commonly known as: VENTOLIN HFA Inhale 2 puffs into the lungs every 6 (six) hours as  needed for wheezing or shortness of breath (Cough).   alendronate 70 MG tablet Commonly known as: FOSAMAX TAKE 1 TABLET BY MOUTH ONCE A WEEK IN THE MORNING   Blink Tears 0.25 % Soln Generic drug: Polyethylene Glycol 400 Place 1-2 drops into both eyes daily.   cetirizine 10 MG tablet Commonly known as: ZyrTEC Allergy Take 1 tablet (10 mg total) by mouth at bedtime.   citalopram 10 MG tablet Commonly known as: CELEXA Take 10 mg by mouth daily.   dicyclomine 20 MG tablet Commonly known as: BENTYL TAKE 1 TABLET BY MOUTH EVERY 6 HOURS AS NEEDED (ABDOMINAL CRAMPING PAIN).   diphenoxylate-atropine 2.5-0.025 MG tablet Commonly known as: Lomotil Take 1 tablet by mouth 4 (four) times daily as needed for diarrhea or loose stools.   famotidine 40 MG tablet Commonly known as: PEPCID Take 1  tablet by mouth 2 (two) times daily.   fluticasone 50 MCG/ACT nasal spray Commonly known as: FLONASE Place 1 spray into both nostrils daily. Begin by using 2 sprays in each nare daily for 3 to 5 days, then decrease to 1 spray in each nare daily.   guaifenesin 400 MG Tabs tablet Commonly known as: HUMIBID E Take 1 tablet 3 times daily as needed for chest congestion and cough   hyoscyamine 0.125 MG tablet Commonly known as: LEVSIN Take 1-2 tablets (0.125-0.25 mg total) by mouth every 4 (four) hours as needed for bladder spasms or cramping.   ibuprofen 400 MG tablet Commonly known as: ADVIL Take 1 tablet (400 mg total) by mouth every 8 (eight) hours as needed for up to 30 doses.   IMMUNE FORMULA PO Take 1 tablet by mouth in the morning. [place] Immune Plus   levothyroxine 88 MCG tablet Commonly known as: SYNTHROID Take 1 tablet by mouth once daily   LYSINE PO Take 1 Scoop by mouth daily.   MAGNESIUM CITRATE PO Take 500 mg by mouth daily. 250 mg/tablet   MELATONIN PO Take 6 mg by mouth at bedtime as needed (sleep).   Metamucil Smooth Texture 58.6 % powder Generic drug: psyllium Take 1 packet by mouth 3 (three) times daily.   ondansetron 4 MG tablet Commonly known as: ZOFRAN Take 1 tablet (4 mg total) by mouth every 4 (four) hours as needed for nausea.   OVER THE COUNTER MEDICATION   OVER THE COUNTER MEDICATION Pt taking miralax every day   Systane 0.4-0.3 % Soln Generic drug: Polyethyl Glycol-Propyl Glycol Place 1-2 drops into both eyes 3 (three) times daily as needed (dry/irritated eyes.).   TURMERIC-GINGER PO Take 2 tablets by mouth daily. Gummies   Vitamin B-12 5000 MCG Subl Take 5,000 mcg by mouth in the morning.   VITAMIN K2 PO Take 1 capsule by mouth daily. Health As It Ought To Be Vitamin K2 Mk-7   Zicam Allergy Relief Gel Place 1 application  into the nose every other day. At night (Zicam Cold Remedy Nasal Swabs)          REVIEW OF  SYSTEMS: A comprehensive ROS was conducted with the patient and is negative except as per HPI     OBJECTIVE:  VS: BP 122/72 (BP Location: Left Arm, Patient Position: Sitting, Cuff Size: Small)   Pulse 74   Ht 5\' 6"  (1.676 m)   Wt 156 lb (70.8 kg)   SpO2 95%   BMI 25.18 kg/m    Wt Readings from Last 3 Encounters:  11/27/22 156 lb (70.8 kg)  10/15/22 153 lb (  69.4 kg)  09/05/22 151 lb 2 oz (68.5 kg)     EXAM: General: Pt appears well and is in NAD  Neck: General: Supple without adenopathy. Thyroid: Thyroid size normal.  No goiter or nodules appreciated.   Lungs: Clear with good BS bilat   Heart: Auscultation: RRR.  Abdomen: soft, left peri-umbilical tenderness  Extremities:  BL LE: No pretibial edema   Mental Status: Judgment, insight: Intact Orientation: Oriented to time, place, and person Mood and affect: No depression, anxiety, or agitation     DATA REVIEWED:   Latest Reference Range & Units 12/21/20 11:04  TSH 0.450 - 4.500 uIU/mL 0.137 (L)     ASSESSMENT/PLAN/RECOMMENDATIONS:   Hypothyroidism:   -She initially requested to switch to Synthroid due to nausea and vomiting that she had attributed to Callahan allergic reaction to levothyroxine, of note she has been on levothyroxine for over a year prior to that  -She switched back to levothyroxine as the Synthroid was cost prohibitive -TFTs are normal, no change    Medications :  Continue levothyroxine 88 MCG daily for now    Recommend continuation of current Tx with primary MD and consultative f/u at [facility] in the future if needed   Signed electronically by: Lyndle Herrlich, MD  [facility]  [facility] [address] [place] [place], [place] [address] Phone: [phone] FAX: [phone]   CC: Jarold Motto, [place] [address] [place] [address] Phone: [phone] Fax: [phone]   Return to Endocrinology clinic as below: No  future appointments.

## 2022-11-28 ENCOUNTER — Encounter: Payer: Self-pay | Admitting: Nurse Practitioner

## 2022-12-03 ENCOUNTER — Ambulatory Visit (INDEPENDENT_AMBULATORY_CARE_PROVIDER_SITE_OTHER): Payer: Self-pay

## 2022-12-03 ENCOUNTER — Ambulatory Visit
Admission: RE | Admit: 2022-12-03 | Discharge: 2022-12-03 | Disposition: A | Discharge status: Home / Self Care | Payer: Self-pay | Source: Ambulatory Visit | Attending: Physician Assistant | Admitting: Physician Assistant

## 2022-12-03 VITALS — BP 133/80 | HR 73 | Resp 17

## 2022-12-03 DIAGNOSIS — S62617A Displaced fracture of proximal phalanx of left little finger, initial encounter for closed fracture: Secondary | ICD-10-CM

## 2022-12-03 DIAGNOSIS — S62619A Displaced fracture of proximal phalanx of unspecified finger, initial encounter for closed fracture: Secondary | ICD-10-CM

## 2022-12-03 NOTE — Discharge Instructions (Signed)
Follow up with Orthopaedist for evaluation.  Call for appointment to be seen.  Ice to area of swelling. Elevate hand

## 2022-12-03 NOTE — ED Provider Notes (Signed)
[facility]    CSN: [id number] Arrival date & time: 12/03/22  1036      History   Chief Complaint Chief Complaint  Patient presents with   Finger Injury    I used my hand to catch my fall Sunday afternoon. My pinky finger & ring finger were underneath me & I fractured my fingers. - Entered by patient    HPI Deborah Callahan is a 62 y.o. female.   Patient complains of pain in her left hand after a fall yesterday.  Patient thinks that she has broken her finger.  Patient states that she slipped when getting up from a picnic table and caught herself with her left hand.  Patient denies any other areas of injury she did not have any impact of her head.  The history is provided by the patient. No language interpreter was used.    Past Medical History:  Diagnosis Date   Arthritis    degenerative cerv. spine , beginning signs of carpal tunnel syndrome- L hand     Complication of anesthesia    Cystocele    Environmental and seasonal allergies    Family history of anesthesia complication    N&V- mother & sister   GERD (gastroesophageal reflux disease)    DIET CONTROLLED   Headache(784.0)    migraines    History of hiatal hernia    History of kidney stones    Hypothyroidism    IBS (irritable bowel syndrome)    IC (interstitial cystitis)    Mild intermittent asthma    Pelvic relaxation    Pneumonia    PONV (postoperative nausea and vomiting)    Wears glasses     Patient Active Problem List   Diagnosis Date Noted   Elevated BP without diagnosis of hypertension 07/04/2022   Chronic diarrhea    Benign neoplasm of colon    Rectal bleeding    Gastroesophageal reflux disease    Bloating    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   Anxiety 06/07/2020   Constipation 06/07/2020   Generalized anxiety disorder 06/07/2020   Hypothyroidism 06/07/2020   Leukocytopenia 06/07/2020   Sacroiliitis, not elsewhere classified (HCC) 06/07/2020   Ulnar neuropathy 06/07/2020    Dysphagia 05/23/2020   Hoarseness 05/23/2020   Myelopathy (HCC) 02/09/2019   Chest pain of uncertain etiology 06/18/2018   Pelvic relaxation 09/23/2014    Class: Present on Admission   Acute cystitis without hematuria 04/01/2014   Cervical disc disorder with radiculopathy of cervical region 04/29/2012   Chronic interstitial cystitis 05/16/2011   Dysuria 05/16/2011   Increased frequency of urination 05/16/2011   Urinary urgency 05/16/2011    Past Surgical History:  Procedure Laterality Date   ABDOMINAL HYSTERECTOMY     partial hysterectomy   ANTERIOR AND POSTERIOR REPAIR  09/23/2014   Procedure: ANTERIOR (CYSTOCELE) AND POSTERIOR REPAIR (RECTOCELE);  Surgeon: Richardean Chimera, MD;  Location: [facility];  Service: Gynecology;;   ANTERIOR CERVICAL DECOMP/DISCECTOMY FUSION N/A 05/08/2012   Procedure: ACDF C5-7  ANTERIOR CERVICAL DECOMPRESSION/DISCECTOMY FUSION 2 LEVELS;  Surgeon: Venita Lick, MD;  Location: [facility];  Service: Orthopedics;  Laterality: N/A;   ANTERIOR CERVICAL DECOMP/DISCECTOMY FUSION N/A 02/09/2019   Procedure: ANTERIOR CERVICAL DECOMPRESSION FUSION CERVICAL 3-4, CERVICAL 4-5 WITH INSTRUMENTATION AND ALLOGRAFT;  Surgeon: Estill Bamberg, MD;  Location: [facility];  Service: Orthopedics;  Laterality: N/A;   BIOPSY  10/17/2021   Procedure: BIOPSY;  Surgeon: Benancio Deeds, MD;  Location: [facility];  Service: Gastroenterology;;  COLONOSCOPY  10/19/2005   COLONOSCOPY WITH PROPOFOL N/A 10/17/2021   Procedure: COLONOSCOPY WITH PROPOFOL;  Surgeon: Benancio Deeds, MD;  Location: [facility];  Service: Gastroenterology;  Laterality: N/A;   D & C HYSTEROSCOPE/ RESECTION POLYP/  POSTERIOR AND ENTEROOCELE REPAIRS/  CARDINAL UTEROSACRAL COLPOSUSPENSION /  PERINEAL BODY REPAIR  04/22/2003   ESOPHAGOGASTRODUODENOSCOPY (EGD) WITH PROPOFOL N/A 10/17/2021   Procedure: ESOPHAGOGASTRODUODENOSCOPY (EGD) WITH PROPOFOL;  Surgeon: Benancio Deeds, MD;  Location: [facility];  Service: Gastroenterology;  Laterality: N/A;   FRACTURE SURGERY Right 2005   Right shoulder with Dr. Thomasena Edis at [facility]   LAPAROSCOPIC ASSISTED VAGINAL HYSTERECTOMY  11/02/2004   w/  Release posterior vaginal scar   LAPAROSCOPIC CHOLECYSTECTOMY  10/11/2001   POLYPECTOMY  10/17/2021   Procedure: POLYPECTOMY;  Surgeon: Benancio Deeds, MD;  Location: [facility];  Service: Gastroenterology;;   Gaspar Bidding DILATION N/A 10/17/2021   Procedure: Gaspar Bidding DILATION;  Surgeon: Benancio Deeds, MD;  Location: [facility];  Service: Gastroenterology;  Laterality: N/A;   TOOTH EXTRACTION     #31   TUBAL LIGATION  1988    OB History   No obstetric history on file.      Home Medications    Prior to Admission medications   Medication Sig Start Date End Date Taking? Authorizing Provider  albuterol (VENTOLIN HFA) 108 (90 Base) MCG/ACT inhaler Inhale 2 puffs into the lungs every 6 (six) hours as needed for wheezing or shortness of breath (Cough). Patient not taking: Reported on 10/15/2022 02/16/22   Theadora Rama Scales, PA-C  alendronate (FOSAMAX) 70 MG tablet TAKE 1 TABLET BY MOUTH ONCE A WEEK IN THE MORNING 07/04/22   Plotnikov, Georgina Quint, MD  cetirizine (ZYRTEC ALLERGY) 10 MG tablet Take 1 tablet (10 mg total) by mouth at bedtime. Patient not taking: Reported on 10/15/2022 06/22/22 12/19/22  Jarold Motto, PA  citalopram (CELEXA) 10 MG tablet Take 10 mg by mouth daily. Patient not taking: Reported on 11/27/2022 08/28/22   [provider]  Cyanocobalamin (VITAMIN B-12) 5000 MCG SUBL Take 5,000 mcg by mouth in the morning. Patient not taking: Reported on 10/15/2022    [provider]  dicyclomine (BENTYL) 20 MG tablet TAKE 1 TABLET BY MOUTH EVERY 6 HOURS AS NEEDED (ABDOMINAL CRAMPING PAIN). Patient not taking: Reported on 11/27/2022 06/04/22   Benancio Deeds, MD  diphenoxylate-atropine (LOMOTIL) 2.5-0.025 MG tablet Take 1 tablet by mouth 4 (four)  times daily as needed for diarrhea or loose stools. Patient not taking: Reported on 10/15/2022 07/04/22   Plotnikov, Georgina Quint, MD  famotidine (PEPCID) 40 MG tablet Take 1 tablet by mouth 2 (two) times daily. Patient not taking: Reported on 10/15/2022    [provider]  fluticasone (FLONASE) 50 MCG/ACT nasal spray Place 1 spray into both nostrils daily. Begin by using 2 sprays in each nare daily for 3 to 5 days, then decrease to 1 spray in each nare daily. Patient not taking: Reported on 10/15/2022 02/16/22   Theadora Rama Scales, PA-C  guaifenesin (HUMIBID E) 400 MG TABS tablet Take 1 tablet 3 times daily as needed for chest congestion and cough Patient not taking: Reported on 10/15/2022 02/16/22   Theadora Rama Scales, PA-C  Homeopathic Products [facility] ALLERGY RELIEF) GEL Place 1 application  into the nose every other day. At night (Zicam Cold Remedy Nasal Swabs) Patient not taking: Reported on 10/15/2022    [provider]  hyoscyamine (LEVSIN) 0.125 MG tablet Take 1-2 tablets (0.125-0.25 mg total) by  mouth every 4 (four) hours as needed for bladder spasms or cramping. Patient not taking: Reported on 11/27/2022 07/04/22   Plotnikov, Georgina Quint, MD  ibuprofen (ADVIL) 400 MG tablet Take 1 tablet (400 mg total) by mouth every 8 (eight) hours as needed for up to 30 doses. Patient not taking: Reported on 10/15/2022 02/16/22   Theadora Rama Scales, PA-C  levothyroxine (SYNTHROID) 88 MCG tablet Take 1 tablet (88 mcg total) by mouth daily. 11/27/22   Shamleffer, Konrad Dolores, MD  LYSINE PO Take 1 Scoop by mouth daily. Patient not taking: Reported on 10/15/2022    [provider]  MAGNESIUM CITRATE PO Take 500 mg by mouth daily. 250 mg/tablet Patient not taking: Reported on 10/15/2022    [provider]  MELATONIN PO Take 6 mg by mouth at bedtime as needed (sleep). Patient not taking: Reported on 10/15/2022    [provider]  Menaquinone-7 (VITAMIN K2 PO) Take  1 capsule by mouth daily. Health As It Ought To Be Vitamin K2 Mk-7 Patient not taking: Reported on 10/15/2022    [provider]  Misc Natural Products (IMMUNE FORMULA PO) Take 1 tablet by mouth in the morning. [place] Immune Plus Patient not taking: Reported on 10/15/2022    [provider]  Multiple Vitamins-Minerals (ADULT ONE DAILY GUMMIES PO) Take 2 tablets by mouth daily. Vitafusion MultiVites Patient not taking: Reported on 10/15/2022    [provider]  ondansetron (ZOFRAN) 4 MG tablet Take 1 tablet (4 mg total) by mouth every 4 (four) hours as needed for nausea. Patient not taking: Reported on 10/15/2022 08/29/21   Hilarie Fredrickson, MD  OVER THE COUNTER MEDICATION     [provider]  OVER THE COUNTER MEDICATION Pt taking miralax every day    [provider]  Polyethyl Glycol-Propyl Glycol (SYSTANE) 0.4-0.3 % SOLN Place 1-2 drops into both eyes 3 (three) times daily as needed (dry/irritated eyes.). Patient not taking: Reported on 10/15/2022    [provider]  Polyethylene Glycol 400 (BLINK TEARS) 0.25 % SOLN Place 1-2 drops into both eyes daily.    [provider]  psyllium (METAMUCIL SMOOTH TEXTURE) 58.6 % powder Take 1 packet by mouth 3 (three) times daily. 07/04/22   Plotnikov, Georgina Quint, MD  TURMERIC-GINGER PO Take 2 tablets by mouth daily. Gummies    [provider]    Family History Family History  Problem Relation Age of Onset   Cancer Mother        breast   Arthritis/Rheumatoid Mother    Diabetes Mother    Cancer Sister        breast   Cancer Maternal Grandmother        breast   Diabetes Paternal Grandfather    Colon cancer Neg Hx    Stomach cancer Neg Hx    Pancreatic cancer Neg Hx    Esophageal cancer Neg Hx     Social History Social History   Tobacco Use   Smoking status: Never   Smokeless tobacco: Never  Vaping Use   Vaping status: Never Used  Substance Use Topics   Alcohol  use: Yes    Comment: OCCASIONAL WINE   Drug use: No     Allergies   Codeine, Oxycodone-acetaminophen, Soybean-containing drug products, Tramadol, Wheat, Almond (diagnostic), Bismuth-containing compounds, Buspar [buspirone], Carafate [sucralfate], Corn-containing products, Egg-derived products, Gluten meal, Lactose intolerance (gi), Latex, Lentil, Omeprazole, Peanuts [peanut oil], and Pepcid [famotidine]   Review of Systems Review of Systems  Musculoskeletal:  Positive for joint swelling and myalgias.  All other systems reviewed and are negative.    Physical Exam Triage Vital Signs ED Triage Vitals [12/03/22 1049]  Encounter Vitals Group     BP 133/80     Systolic BP Percentile      Diastolic BP Percentile      Pulse Rate 73     Resp 17     Temp      Temp src      SpO2 96 %     Weight      Height      Head Circumference      Peak Flow      Pain Score 9     Pain Loc      Pain Education      Exclude from Growth Chart    No data found.  Updated Vital Signs BP 133/80 (BP Location: Left Arm)   Pulse 73   Resp 17   SpO2 96%   Visual Acuity Right Eye Distance:   Left Eye Distance:   Bilateral Distance:    Right Eye Near:   Left Eye Near:    Bilateral Near:     Physical Exam Vitals and nursing note reviewed.  Constitutional:      Appearance: She is well-developed.  HENT:     Head: Normocephalic.  Cardiovascular:     Rate and Rhythm: Normal rate.  Pulmonary:     Effort: Pulmonary effort is normal.  Musculoskeletal:        General: Swelling and tenderness present.     Cervical back: Normal range of motion.     Comments: Swollen bruised left fifth finger and distal area of left hand, neurovascular neurosensory are intact range of motion fifth finger limited due to pain  Skin:    General: Skin is warm.  Neurological:     General: No focal deficit present.     Mental Status: She is alert and oriented to person, place, and time.  Psychiatric:        Mood  and Affect: Mood normal.      UC Treatments / Results  Labs (all labs ordered are listed, but only abnormal results are displayed) Labs Reviewed - No data to display  EKG   Radiology DG Hand Complete Left  Result Date: 12/03/2022 CLINICAL DATA:  Fall on outstretched hand. Left fourth and fifth metacarpal and finger pain. EXAM: LEFT HAND - COMPLETE 3+ VIEW COMPARISON:  None available FINDINGS: Mild-to-moderate second and fifth and mild third and fourth DIP joint space narrowing. Visualized on oblique view only is a longitudinal fracture at the medial base of the proximal phalanx of the fifth finger with less than 1 mm fracture line diastasis. No definite intra-articular extension. IMPRESSION: Longitudinal acute fracture at the medial base of the proximal phalanx of the fifth finger. Electronically Signed   By: Neita Garnet M.D.   On: 12/03/2022 13:00    Procedures Procedures (including critical care time)  Medications Ordered in UC Medications - No data to display  Initial Impression / Assessment and Plan / UC Course  I have reviewed the triage vital signs and the nursing notes.  Pertinent labs & imaging results that were available during my care of the patient were reviewed by me and considered in my medical decision making (see chart for details).   X-ray shows a fracture at the base of the proximal phalanx of the left fifth finger.  Patient reports that she has  a Hydrographic surveyor at [facility] all hand that she will follow-up with.  Patient is placed in a finger splint.  Patient is advised Tylenol or ibuprofen for discomfort patient is advised to return if any problems Final Clinical Impressions(s) / UC Diagnoses   Final diagnoses:  Closed fracture of proximal phalanx of digit of left hand, initial encounter     Discharge Instructions      Follow up with Orthopaedist for evaluation.  Call for appointment to be seen.  Ice to area of swelling. Elevate hand    ED  Prescriptions   None    PDMP not reviewed this encounter. An After Visit Summary was printed and given to the patient.       Elson Areas, [place] 12/03/22 1402

## 2022-12-03 NOTE — ED Triage Notes (Signed)
Pt presents with c/o a lt hand pain x 2 days.   Pt states she was getting out of a picnic table, her foot got caught and then caught herself with her hand during a fall.   Presents with lt hand swelling, states her pinky hurts a lot more.

## 2023-02-21 ENCOUNTER — Ambulatory Visit: Payer: Self-pay | Admitting: Physician Assistant

## 2023-02-21 ENCOUNTER — Other Ambulatory Visit: Payer: Self-pay | Admitting: Physician Assistant

## 2023-02-21 NOTE — Telephone Encounter (Signed)
 Deborah Callahan, pt was dismissed by Dr. Brooks Sailors, okay to fill Levothyroxine 88 mcg, aslo pharmacy needs permission to change manufacturer.

## 2023-02-21 NOTE — Telephone Encounter (Signed)
 Copied from CRM [id number]. Topic: Clinical - Medication Question >> Feb 21, 2023  2:51 PM Gerardine PARAS wrote: Reason for CRM: Patient called in regarding [facility] pharmacy needing consent for a manufacture change for levothyroxine  (SYNTHROID ) 88 MCG tablet. Pharmacy stated reaching out to office and was unsuccessful, asked to be called back at [phone] regarding getting consent over

## 2023-02-21 NOTE — Telephone Encounter (Signed)
 Spoke to pt told her she will need to contact Dr. Sam she is the one who prescribes your thyroid  medication. Pt verbalized understanding and said Dr. Sam has dismissed her. Told her she will need to contact the pharmacy and have them send the request to [facility] since we have not received it. Also you need to schedule an appointment. Pt verbalized understanding.

## 2023-02-21 NOTE — Telephone Encounter (Signed)
 See other message

## 2023-02-21 NOTE — Telephone Encounter (Signed)
 Copied from CRM [id number]. Topic: Clinical - Medication Refill >> Feb 21, 2023  2:32 PM Macario HERO wrote: Most Recent Primary Care Visit:  Provider: WENDOLYN CALDRON MARIE  Department: [facility]  Visit Type: OFFICE VISIT  Date: 09/05/2022  Medication: levothyroxine  (SYNTHROID ) 88 MCG tablet [[id number]]  Has the patient contacted their pharmacy? Yes (Agent: If no, request that the patient contact the pharmacy for the refill. If patient does not wish to contact the pharmacy document the reason why and proceed with request.) (Agent: If yes, when and what did the pharmacy advise?)  Is this the correct pharmacy for this prescription? Yes If no, delete pharmacy and type the correct one.  This is the patient's preferred pharmacy:  [facility] [address], [place] - [address] [place] [address] Phone: [phone] Fax: [phone]   Has the prescription been filled recently? Yes  Is the patient out of the medication? Yes, currently out. Also wants to know if she can go a few days without.  Has the patient been seen for an appointment in the last year OR does the patient have an upcoming appointment? Yes  Can we respond through MyChart? Yes  Agent: Please be advised that Rx refills may take up to 3 business days. We ask that you follow-up with your pharmacy.

## 2023-02-21 NOTE — Telephone Encounter (Signed)
 Medication refill handled during previous triage encounter. Please see other encounter notes.  Copied from CRM [id number]. Topic: Clinical - Medication Question >> Feb 21, 2023  2:37 PM Macario HERO wrote: Reason for CRM: Patient stated that she is completely out of her medication [levothyroxine  (SYNTHROID ) 88 MCG tablet [[id number]]] and does not know if she can go without her medication for a day. I created a refill request already but she is worried about going without. Requesting a call back.

## 2023-02-21 NOTE — Telephone Encounter (Signed)
 Pharmacy returned call. Requests to be called.

## 2023-02-22 NOTE — Telephone Encounter (Signed)
 I have not seen patient since Jan 2023. Needs appointment for refills.

## 2023-02-22 NOTE — Telephone Encounter (Signed)
 Please call patient and schedule an appointment per [facility]. We can not refill her thyroid medication till she is seen. Please schedule physical, she is overdue.

## 2023-02-22 NOTE — Telephone Encounter (Signed)
 Contact patient and scheduled for a cpe for next week 02/27/2023

## 2023-02-27 ENCOUNTER — Encounter: Payer: Self-pay | Admitting: Physician Assistant

## 2023-02-27 ENCOUNTER — Ambulatory Visit (INDEPENDENT_AMBULATORY_CARE_PROVIDER_SITE_OTHER): Payer: Self-pay | Admitting: Physician Assistant

## 2023-02-27 VITALS — BP 136/80 | HR 72 | Temp 97.2°F | Ht 66.0 in | Wt 155.5 lb

## 2023-02-27 DIAGNOSIS — R109 Unspecified abdominal pain: Secondary | ICD-10-CM

## 2023-02-27 DIAGNOSIS — E039 Hypothyroidism, unspecified: Secondary | ICD-10-CM

## 2023-02-27 DIAGNOSIS — J029 Acute pharyngitis, unspecified: Secondary | ICD-10-CM

## 2023-02-27 LAB — CBC
HCT: 43.1 % (ref 36.0–46.0)
Hemoglobin: 14.6 g/dL (ref 12.0–15.0)
MCHC: 33.7 g/dL (ref 30.0–36.0)
MCV: 94.9 fL (ref 78.0–100.0)
Platelets: 218 10*3/uL (ref 150.0–400.0)
RBC: 4.54 Mil/uL (ref 3.87–5.11)
RDW: 12.5 % (ref 11.5–15.5)
WBC: 3.8 10*3/uL — ABNORMAL LOW (ref 4.0–10.5)

## 2023-02-27 LAB — POCT RAPID STREP A (OFFICE): Rapid Strep A Screen: NEGATIVE

## 2023-02-27 LAB — BASIC METABOLIC PANEL
BUN: 14 mg/dL (ref 6–23)
CO2: 28 meq/L (ref 19–32)
Calcium: 9.3 mg/dL (ref 8.4–10.5)
Chloride: 104 meq/L (ref 96–112)
Creatinine, Ser: 0.69 mg/dL (ref 0.40–1.20)
GFR: 92.51 mL/min (ref >60.00)
Glucose, Bld: 105 mg/dL — ABNORMAL HIGH (ref 70–99)
Potassium: 4 meq/L (ref 3.5–5.1)
Sodium: 138 meq/L (ref 135–145)

## 2023-02-27 MED ORDER — AMOXICILLIN-POT CLAVULANATE 875-125 MG PO TABS: 1.0000 | ORAL_TABLET | Freq: Two times a day (BID) | ORAL | 0 refills | Status: DC

## 2023-02-27 NOTE — Patient Instructions (Signed)
 It was great to see you!  Blood work and urine culture  Start augmentin  Let's follow-up in 1 year, sooner if you have concerns.  Take care,  Jarold Motto PA-C

## 2023-02-27 NOTE — Progress Notes (Signed)
 Deborah Callahan is a 63 y.o. female here for a new problem.  History of Present Illness:   Chief Complaint  Patient presents with   Medication follow up   Sinus Problem    Pt c/o sinus pressure, headache, coughing and expectorating yellow/green sputum, sore throat, fever x 1 week, 101 this past Sunday, chest congestion, symptoms started on 12/27   Cough  She complains today of a cough that has persisted for the past 2 weeks. She is also experiencing accompanying sinus pain, headaches, ear pain, and sputum production.  Sore throat and chest discomfortness from coughing seem to be the worse accompanying symptoms.  She's also experiencing an accompanying fever that has persisted for the past week. Highest temperature was 102 degrees.   Flank pain She is also experiencing lower back pain. She feels as if the pain is not muscular.   She has been taking tylenol  to help relief her symptoms.  Strep test in office today is negative.  She also reports urinary symptoms and reports a history of UTI but is following up with Urology next month.   She denies any heartburn.   Thyroid   Reports compliance and good tolerance of 88 mcg synthroid  once daily.   Current TSH levels are within normal levels.  She's currently not followed by a provider for this.    Past Medical History:  Diagnosis Date   Arthritis    degenerative cerv. spine , beginning signs of carpal tunnel syndrome- L hand     Complication of anesthesia    Cystocele    Environmental and seasonal allergies    Family history of anesthesia complication    N&V- mother & sister   GERD (gastroesophageal reflux disease)    DIET CONTROLLED   Headache(784.0)    migraines    History of hiatal hernia    History of kidney stones    Hypothyroidism    IBS (irritable bowel syndrome)    IC (interstitial cystitis)    Mild intermittent asthma    Pelvic relaxation    Pneumonia    PONV (postoperative nausea and vomiting)    Wears  glasses      Social History   Tobacco Use   Smoking status: Never   Smokeless tobacco: Never  Vaping Use   Vaping status: Never Used  Substance Use Topics   Alcohol use: Yes    Comment: OCCASIONAL WINE   Drug use: No    Past Surgical History:  Procedure Laterality Date   ABDOMINAL HYSTERECTOMY     partial hysterectomy   ANTERIOR AND POSTERIOR REPAIR  09/23/2014   Procedure: ANTERIOR (CYSTOCELE) AND POSTERIOR REPAIR (RECTOCELE);  Surgeon: Norleen Skill, MD;  Location: [facility];  Service: Gynecology;;   ANTERIOR CERVICAL DECOMP/DISCECTOMY FUSION N/A 05/08/2012   Procedure: ACDF C5-7  ANTERIOR CERVICAL DECOMPRESSION/DISCECTOMY FUSION 2 LEVELS;  Surgeon: Donaciano Sprang, MD;  Location: [facility];  Service: Orthopedics;  Laterality: N/A;   ANTERIOR CERVICAL DECOMP/DISCECTOMY FUSION N/A 02/09/2019   Procedure: ANTERIOR CERVICAL DECOMPRESSION FUSION CERVICAL 3-4, CERVICAL 4-5 WITH INSTRUMENTATION AND ALLOGRAFT;  Surgeon: Beuford Anes, MD;  Location: [facility];  Service: Orthopedics;  Laterality: N/A;   BIOPSY  10/17/2021   Procedure: BIOPSY;  Surgeon: Leigh Elspeth SQUIBB, MD;  Location: [facility];  Service: Gastroenterology;;   COLONOSCOPY  10/19/2005   COLONOSCOPY WITH PROPOFOL  N/A 10/17/2021   Procedure: COLONOSCOPY WITH PROPOFOL ;  Surgeon: Leigh Elspeth SQUIBB, MD;  Location: [facility];  Service: Gastroenterology;  Laterality: N/A;  D & C HYSTEROSCOPE/ RESECTION POLYP/  POSTERIOR AND ENTEROOCELE REPAIRS/  CARDINAL UTEROSACRAL COLPOSUSPENSION /  PERINEAL BODY REPAIR  04/22/2003   ESOPHAGOGASTRODUODENOSCOPY (EGD) WITH PROPOFOL  N/A 10/17/2021   Procedure: ESOPHAGOGASTRODUODENOSCOPY (EGD) WITH PROPOFOL ;  Surgeon: Leigh Elspeth SQUIBB, MD;  Location: [facility];  Service: Gastroenterology;  Laterality: N/A;   FRACTURE SURGERY Right 2005   Right shoulder with Dr. Gerome at [facility]   LAPAROSCOPIC ASSISTED VAGINAL HYSTERECTOMY  11/02/2004   w/  Release posterior  vaginal scar   LAPAROSCOPIC CHOLECYSTECTOMY  10/11/2001   POLYPECTOMY  10/17/2021   Procedure: POLYPECTOMY;  Surgeon: Leigh Elspeth SQUIBB, MD;  Location: [facility];  Service: Gastroenterology;;   HARLEY DILATION N/A 10/17/2021   Procedure: HARLEY DILATION;  Surgeon: Leigh Elspeth SQUIBB, MD;  Location: [facility];  Service: Gastroenterology;  Laterality: N/A;   TOOTH EXTRACTION     #31   TUBAL LIGATION  1988    Family History  Problem Relation Age of Onset   Cancer Mother        breast   Arthritis/Rheumatoid Mother    Diabetes Mother    Cancer Sister        breast   Cancer Maternal Grandmother        breast   Diabetes Paternal Grandfather    Colon cancer Neg Hx    Stomach cancer Neg Hx    Pancreatic cancer Neg Hx    Esophageal cancer Neg Hx     Allergies  Allergen Reactions   Codeine Anaphylaxis, Hives and Itching   Oxycodone -Acetaminophen  Hives, Shortness Of Breath, Itching and Swelling   Soybean-Containing Drug Products Hives, Nausea And Vomiting and Swelling   Tramadol Hives, Nausea And Vomiting and Swelling   Wheat Anaphylaxis, Hives, Diarrhea and Swelling   Almond (Diagnostic) Hives, Diarrhea and Nausea And Vomiting   Bismuth-Containing Compounds Other (See Comments)    Ulcer (pt to avoid due to md instruction)   Buspar  [Buspirone ] Hives and Itching    Bleeding in scalp   Carafate  [Sucralfate ] Hives, Itching and Nausea And Vomiting    Bleeding in scalp, headaches   Corn-Containing Products Diarrhea   Egg-Derived Products Hives, Diarrhea and Nausea And Vomiting    Tolerated propofol  on multiple occasions with no difficulty   Gluten Meal     Other reaction(s): Unknown   Lactose Intolerance (Gi) Hives, Diarrhea and Nausea And Vomiting   Latex Itching and Other (See Comments)    Skin irritation/ reddness   Lentil Diarrhea and Swelling    Bowel irritation    Omeprazole  Hives and Other (See Comments)    Multiple intolerances -- decrease in energy, headaches,  neuropathy in hands, throat irritation, changes in appetite, muscle aches, itchiness accompanied by widespread tiny blisters over her body and in her mouth   Peanuts [Peanut Oil] Hives, Diarrhea and Nausea And Vomiting   Pepcid  [Famotidine ] Other (See Comments)    Unknown     Current Medications:   Current Outpatient Medications:    albuterol  (VENTOLIN  HFA) 108 (90 Base) MCG/ACT inhaler, Inhale 2 puffs into the lungs every 6 (six) hours as needed for wheezing or shortness of breath (Cough)., Disp: 18 g, Rfl: 5   alendronate  (FOSAMAX ) 70 MG tablet, TAKE 1 TABLET BY MOUTH ONCE A WEEK IN THE MORNING, Disp: 12 tablet, Rfl: 1   amoxicillin -clavulanate (AUGMENTIN ) 875-125 MG tablet, Take 1 tablet by mouth 2 (two) times daily., Disp: 20 tablet, Rfl: 0   Cyanocobalamin (VITAMIN B-12) 5000 MCG SUBL, Take 5,000 mcg by mouth in  the morning., Disp: , Rfl:    dicyclomine  (BENTYL ) 20 MG tablet, TAKE 1 TABLET BY MOUTH EVERY 6 HOURS AS NEEDED (ABDOMINAL CRAMPING PAIN)., Disp: 30 tablet, Rfl: 0   diphenoxylate -atropine  (LOMOTIL ) 2.5-0.025 MG tablet, Take 1 tablet by mouth 4 (four) times daily as needed for diarrhea or loose stools., Disp: 40 tablet, Rfl: 0   fluticasone  (FLONASE ) 50 MCG/ACT nasal spray, Place 1 spray into both nostrils daily. Begin by using 2 sprays in each nare daily for 3 to 5 days, then decrease to 1 spray in each nare daily., Disp: 15.8 mL, Rfl: 2   guaifenesin  (HUMIBID E) 400 MG TABS tablet, Take 1 tablet 3 times daily as needed for chest congestion and cough, Disp: 21 tablet, Rfl: 0   Homeopathic Products (ZICAM ALLERGY RELIEF) GEL, Place 1 application  into the nose every other day. At night (Zicam Cold Remedy Nasal Swabs), Disp: , Rfl:    ibuprofen  (ADVIL ) 400 MG tablet, Take 1 tablet (400 mg total) by mouth every 8 (eight) hours as needed for up to 30 doses., Disp: 30 tablet, Rfl: 0   levothyroxine  (SYNTHROID ) 88 MCG tablet, Take 1 tablet (88 mcg total) by mouth daily., Disp: 90 tablet,  Rfl: 3   LYSINE PO, Take 1 Scoop by mouth daily., Disp: , Rfl:    MAGNESIUM CITRATE PO, Take 500 mg by mouth daily. 250 mg/tablet, Disp: , Rfl:    MELATONIN PO, Take 6 mg by mouth at bedtime as needed (sleep)., Disp: , Rfl:    Multiple Vitamins-Minerals (ADULT ONE DAILY GUMMIES PO), Take 2 tablets by mouth daily. Vitafusion MultiVites, Disp: , Rfl:    OVER THE COUNTER MEDICATION, Pt taking miralax  every day, Disp: , Rfl:    Polyethyl Glycol-Propyl Glycol (SYSTANE) 0.4-0.3 % SOLN, Place 1-2 drops into both eyes 3 (three) times daily as needed (dry/irritated eyes.)., Disp: , Rfl:    psyllium (METAMUCIL SMOOTH TEXTURE) 58.6 % powder, Take 1 packet by mouth 3 (three) times daily., Disp: 283 g, Rfl: 12   TURMERIC-GINGER PO, Take 2 tablets by mouth daily. Gummies, Disp: , Rfl:    cetirizine  (ZYRTEC  ALLERGY) 10 MG tablet, Take 1 tablet (10 mg total) by mouth at bedtime. (Patient not taking: Reported on 10/15/2022), Disp: 90 tablet, Rfl: 1   Review of Systems:   Review of Systems  Constitutional:  Positive for fever.  HENT:  Positive for congestion, ear pain, sinus pain and sore throat.   Respiratory:  Positive for cough, sputum production and shortness of breath.   Gastrointestinal:  Negative for heartburn.  Musculoskeletal:  Positive for back pain.  Neurological:  Positive for headaches.    Vitals:   Vitals:   02/27/23 1111  BP: 136/80  Pulse: 72  Temp: (!) 97.2 F (36.2 C)  TempSrc: Temporal  SpO2: 98%  Weight: 155 lb 8 oz (70.5 kg)  Height: 5' 6 (1.676 m)     Body mass index is 25.1 kg/m.  Physical Exam:   Physical Exam Vitals and nursing note reviewed.  Constitutional:      General: She is not in acute distress.    Appearance: She is well-developed. She is not ill-appearing or toxic-appearing.  HENT:     Head: Normocephalic and atraumatic.     Right Ear: Tympanic membrane, ear canal and external ear normal. Tympanic membrane is not erythematous, retracted or bulging.      Left Ear: Tympanic membrane, ear canal and external ear normal. Tympanic membrane is not erythematous, retracted or bulging.  Nose:     Right Sinus: Frontal sinus tenderness present. No maxillary sinus tenderness.     Left Sinus: Frontal sinus tenderness present. No maxillary sinus tenderness.     Mouth/Throat:     Pharynx: Uvula midline. Posterior oropharyngeal erythema present.  Eyes:     General: Lids are normal.     Conjunctiva/sclera: Conjunctivae normal.  Neck:     Trachea: Trachea normal.  Cardiovascular:     Rate and Rhythm: Normal rate and regular rhythm.     Pulses: Normal pulses.     Heart sounds: Normal heart sounds, S1 normal and S2 normal.  Pulmonary:     Effort: Pulmonary effort is normal.     Breath sounds: Normal breath sounds. No decreased breath sounds, wheezing, rhonchi or rales.  Musculoskeletal:     Comments: No decreased ROM 2/2 pain with flexion/extension, lateral side bends, or rotation. Reproducible tenderness with deep palpation to bilateral paraspinal lumbar muscles. No bony tenderness.   Lymphadenopathy:     Cervical: No cervical adenopathy.  Skin:    General: Skin is warm and dry.  Neurological:     Mental Status: She is alert.     GCS: GCS eye subscore is 4. GCS verbal subscore is 5. GCS motor subscore is 6.  Psychiatric:        Speech: Speech normal.        Behavior: Behavior normal. Behavior is cooperative.    Results for orders placed or performed in visit on 02/27/23  POCT rapid strep A  Result Value Ref Range   Rapid Strep A Screen Negative Negative    Assessment and Plan:   Sore throat No red flags on exam.   Will initiate Augmentin  for sinusitis per orders.  Discussed taking medications as prescribed.  Reviewed return precautions including new or worsening fever, SOB, new or worsening cough or other concerns.  Push fluids and rest.  I recommend that patient follow-up if symptoms worsen or persist despite treatment x 7-10 days,  sooner if needed.   Flank pain Concern for possible urinary tract infection, early pyelonephritis? Will start Augmentin , order urine culture and CBC/BMP Push fluids and rest If new/worsening symptom(s) in the meantime, I asked her to let us  know    Acquired hypothyroidism She is currently well controlled I will takeover management of her levothyroxine  88 mcg daily If becomes uncontrolled, she is agreeable to referral at [facility] endo in [place]    I,Safa M Kadhim,acting as a scribe for [employer], PA.,have documented all relevant documentation on the behalf of Lucie Buttner, PA,as directed by  Lucie Buttner, PA while in the presence of Lucie Buttner, [place].   I, Lucie Buttner, [place], have reviewed all documentation for this visit. The documentation on 02/27/23 for the exam, diagnosis, procedures, and orders are all accurate and complete.   Lucie Buttner, PA-C

## 2023-02-28 ENCOUNTER — Ambulatory Visit: Payer: Self-pay | Admitting: Physician Assistant

## 2023-02-28 LAB — URINE CULTURE
MICRO NUMBER:: 15932681
Result:: NO GROWTH
SPECIMEN QUALITY:: ADEQUATE

## 2023-03-01 ENCOUNTER — Encounter: Payer: Self-pay | Admitting: Physician Assistant

## 2023-03-21 ENCOUNTER — Ambulatory Visit: Payer: Self-pay | Admitting: Physician Assistant

## 2023-03-21 NOTE — Telephone Encounter (Signed)
Noted, appt tomorrow

## 2023-03-21 NOTE — Telephone Encounter (Signed)
  Chief Complaint: Shoulder pain Symptoms: left shoulder pain, nausea, headache Frequency: Constant Pertinent Negatives: Patient denies vomiting, immobility of left shoulder/arm, numbness/weakness, fever, swelling of left arm, CP Disposition: [] ED /[] Urgent Care (no appt availability in office) / [x] Appointment(In office/virtual)/ []  [facility]/ [] Home Care/ [] Refused Recommended Disposition /[] [place] [facility]/ []  Follow-up with PCP Additional Notes: Patient called with complaints of left shoulder pain, nausea, and headache. Patient states pain started Thursday after she slept awkwardly in a hospital chair. Patient states that pain does not radiate but also has referred pain in her back, and believes that pain could be caused from her neck that she has had multiple breaks on in the past and slept awkwardly on. Patient denies pain radiating except slightly down top of arm, and describes pain as "like a knife cutting into my shoulder." Patient is out of town and states that she is also getting nauseous with the pain. Patient states she has taken tylenol and gotten some relief but wants a different med that will better relieve the pain and also treat her nausea. Patient advised by this RN that she can take non drowsy dramamine or Pepto bismol for nausea and to try NSAIDs like Aleve or Motrin for pain relief. Patient also advised by this RN to be seen within 3 days, and patient agreeable with appt already scheduled for 03/22/23. Patient advised by this RN to call back or go to UC with worsening symptoms even if she is out of town still. Patient verbalized understanding.   Copied from CRM [id number]. Topic: Appointments - Appointment Scheduling >> Mar 21, 2023  9:42 AM Tiffany H wrote: Patient/patient representative is calling to schedule an appointment. Refer to attachments for appointment information.   Patient advised that she's concerned that she has a pinched nerve. Pain level is  nauseating. Scheduled 03/22/23 appointment. Reason for Disposition  Pain is worsened or caused by bending the neck  Answer Assessment - Initial Assessment Questions 1. ONSET: "When did the pain start?"     Thursday 2. LOCATION: "Where is the pain located?"     Left shoulder blade 3. PAIN: "How bad is the pain?" (Scale 1-10; or mild, moderate, severe)   - MILD (1-3): doesn't interfere with normal activities   - MODERATE (4-7): interferes with normal activities (e.g., work or school) or awakens from sleep   - SEVERE (8-10): excruciating pain, unable to do any normal activities, unable to move arm at all due to pain     Severe 4. WORK OR EXERCISE: "Has there been any recent work or exercise that involved this part of the body?"     Denies 5. CAUSE: "What do you think is causing the shoulder pain?"     "I think I tweaked a nerve in my neck from sitting in that hospital chair and now that nerve is  6. OTHER SYMPTOMS: "Do you have any other symptoms?" (e.g., neck pain, swelling, rash, fever, numbness, weakness)     Nausea, headache  Protocols used: Shoulder Pain-A-AH

## 2023-03-22 ENCOUNTER — Other Ambulatory Visit: Payer: Self-pay

## 2023-03-22 ENCOUNTER — Ambulatory Visit: Payer: Self-pay | Admitting: Physician Assistant

## 2023-03-22 ENCOUNTER — Telehealth: Payer: Self-pay | Admitting: *Deleted

## 2023-03-22 ENCOUNTER — Emergency Department (HOSPITAL_BASED_OUTPATIENT_CLINIC_OR_DEPARTMENT_OTHER)
Admission: EM | Admit: 2023-03-22 | Discharge: 2023-03-22 | Disposition: A | Discharge status: Home / Self Care | Payer: Self-pay | Attending: Emergency Medicine | Admitting: Emergency Medicine

## 2023-03-22 ENCOUNTER — Emergency Department (HOSPITAL_BASED_OUTPATIENT_CLINIC_OR_DEPARTMENT_OTHER): Payer: Self-pay

## 2023-03-22 DIAGNOSIS — E039 Hypothyroidism, unspecified: Secondary | ICD-10-CM | POA: Insufficient documentation

## 2023-03-22 DIAGNOSIS — Z91040 Latex allergy status: Secondary | ICD-10-CM | POA: Insufficient documentation

## 2023-03-22 DIAGNOSIS — R072 Precordial pain: Secondary | ICD-10-CM | POA: Insufficient documentation

## 2023-03-22 DIAGNOSIS — Z91010 Allergy to peanuts: Secondary | ICD-10-CM | POA: Insufficient documentation

## 2023-03-22 DIAGNOSIS — M549 Dorsalgia, unspecified: Secondary | ICD-10-CM | POA: Insufficient documentation

## 2023-03-22 DIAGNOSIS — Z87442 Personal history of urinary calculi: Secondary | ICD-10-CM | POA: Insufficient documentation

## 2023-03-22 DIAGNOSIS — Z79899 Other long term (current) drug therapy: Secondary | ICD-10-CM | POA: Insufficient documentation

## 2023-03-22 DIAGNOSIS — M5010 Cervical disc disorder with radiculopathy, unspecified cervical region: Secondary | ICD-10-CM

## 2023-03-22 LAB — CBC
HCT: 43 % (ref 36.0–46.0)
Hemoglobin: 14.8 g/dL (ref 12.0–15.0)
MCH: 31.4 pg (ref 26.0–34.0)
MCHC: 34.4 g/dL (ref 30.0–36.0)
MCV: 91.3 fL (ref 80.0–100.0)
Platelets: 225 10*3/uL (ref 150–400)
RBC: 4.71 MIL/uL (ref 3.87–5.11)
RDW: 11.7 % (ref 11.5–15.5)
WBC: 4.7 10*3/uL (ref 4.0–10.5)
nRBC: 0 % (ref 0.0–0.2)

## 2023-03-22 LAB — TROPONIN I (HIGH SENSITIVITY)
Troponin I (High Sensitivity): 2 ng/L (ref <18)
Troponin I (High Sensitivity): 2 ng/L (ref <18)

## 2023-03-22 LAB — BASIC METABOLIC PANEL
Anion gap: 8 (ref 5–15)
BUN: 12 mg/dL (ref 8–23)
CO2: 25 mmol/L (ref 22–32)
Calcium: 9.2 mg/dL (ref 8.9–10.3)
Chloride: 98 mmol/L (ref 98–111)
Creatinine, Ser: 0.69 mg/dL (ref 0.44–1.00)
GFR, Estimated: >60 mL/min (ref >60)
Glucose, Bld: 121 mg/dL — ABNORMAL HIGH (ref 70–99)
Potassium: 4.3 mmol/L (ref 3.5–5.1)
Sodium: 131 mmol/L — ABNORMAL LOW (ref 135–145)

## 2023-03-22 LAB — D-DIMER, QUANTITATIVE: D-Dimer, Quant: <0.27 ug{FEU}/mL (ref 0.00–0.50)

## 2023-03-22 MED ORDER — KETOROLAC TROMETHAMINE 15 MG/ML IJ SOLN
15.0000 mg | Freq: Once | INTRAMUSCULAR | Status: AC
  Administered 2023-03-22: 15 mg via INTRAVENOUS
  Filled 2023-03-22: qty 1

## 2023-03-22 MED ORDER — KETOROLAC TROMETHAMINE 10 MG PO TABS: 10.0000 mg | ORAL_TABLET | Freq: Four times a day (QID) | ORAL | 0 refills | Status: DC | PRN

## 2023-03-22 MED ORDER — METHOCARBAMOL 500 MG PO TABS: 500.0000 mg | ORAL_TABLET | Freq: Two times a day (BID) | ORAL | 0 refills | Status: DC

## 2023-03-22 MED ORDER — ONDANSETRON HCL 4 MG/2ML IJ SOLN
4.0000 mg | Freq: Once | INTRAMUSCULAR | Status: AC
  Administered 2023-03-22: 4 mg via INTRAVENOUS
  Filled 2023-03-22: qty 2

## 2023-03-22 MED ORDER — FENTANYL CITRATE PF 50 MCG/ML IJ SOSY
50.0000 ug | PREFILLED_SYRINGE | Freq: Once | INTRAMUSCULAR | Status: AC
  Administered 2023-03-22: 50 ug via INTRAVENOUS
  Filled 2023-03-22: qty 1

## 2023-03-22 NOTE — Telephone Encounter (Signed)
Copied from CRM [id number]. Topic: Clinical - Medical Advice >> Mar 22, 2023  1:07 PM Sim Boast F wrote: Reason for CRM: Patient would like to know if Lelon Mast can give her lidocaine shot or does she need to be referred to ortho?

## 2023-03-22 NOTE — Addendum Note (Signed)
Addended by: Jimmye Norman on: 03/22/2023 02:14 PM   Modules accepted: Orders

## 2023-03-22 NOTE — Telephone Encounter (Signed)
Spoke to pt told her Lelon Mast said to send you to Sports Medicine. I have placed a referral to them for you and someone will contact you to schedule an appt. Pt verbalized understanding. Referral put in Epic.

## 2023-03-22 NOTE — ED Provider Notes (Signed)
The patient was seen overnight for chest pain.  Her initial workup was reassuring.  Second troponin is pending at the time of signout.  Her D-dimer here was negative.  The patient does have an appoint with her primary care doctor today and the plan is for discharge with outpatient follow-up if her workup is negative.  Physical Exam  BP (!) 147/68   Pulse 63   Temp 98.6 F (37 C) (Oral)   Resp 12   Ht 5\' 6"  (1.676 m)   Wt 70.5 kg   SpO2 97%   BMI 25.09 kg/m   Physical Exam General: No acute distress  Procedures  Procedures  ED Course / MDM   Clinical Course as of 03/22/23 0835  Fri Mar 22, 2023  0646 Initial workup overall unremarkable the patient is still having pain.  Will continue to manage pain.  Repeat troponin pending  Given history and exam, I have low suspicion for dissection.  She is low risk for PE, D-dimer negative [DW]  0707 Signed out to Dr. Rhae Hammock with repeat troponin pending [DW]    Clinical Course User Index [DW] Zadie Rhine, MD   Medical Decision Making Amount and/or Complexity of Data Reviewed Labs: ordered. Radiology: ordered. ECG/medicine tests: ordered.  Risk Prescription drug management.   The patient's troponin here is negative.  She is discharged with return precautions.       Durwin Glaze, MD 03/22/23 [phone]

## 2023-03-22 NOTE — Telephone Encounter (Signed)
 Please see message and advise

## 2023-03-22 NOTE — ED Provider Notes (Signed)
[place] EMERGENCY DEPARTMENT AT [facility] Provider Note   CSN: [id number] Arrival date & time: 03/22/23  0541     History  Chief Complaint  Patient presents with   Chest Pain    Deborah Callahan Deborah Callahan is a 63 y.o. female.  The history is provided by the patient and the spouse.   Patient with history of hypothyroidism and 18 drug allergies presents with chest and back pain.  Patient reports about a week ago she was staying in a hospital room with her mother.  Her mother had a cardiac event and she was staying with her and sleeping in a chair.  Since that time she has had pain in her left chest and left back.  She reports that sharp and hurts to breathe.  It is also worsened with movement and palpation.  At times she has had numbness in her left arm but that has resolved. Denies any known history of CAD/VTE.  She is a non-smoker. No hemoptysis  no fevers She reports pain in her left breast but is supposed to get a mammogram but is concerned there is something wrong with her breast  She has taken home medications without relief No falls or trauma Past Medical History:  Diagnosis Date   Arthritis    degenerative cerv. spine , beginning signs of carpal tunnel syndrome- L hand     Complication of anesthesia    Cystocele    Environmental and seasonal allergies    Family history of anesthesia complication    N&V- mother & sister   GERD (gastroesophageal reflux disease)    DIET CONTROLLED   Headache(784.0)    migraines    History of hiatal hernia    History of kidney stones    Hypothyroidism    IBS (irritable bowel syndrome)    IC (interstitial cystitis)    Mild intermittent asthma    Pelvic relaxation    Pneumonia    PONV (postoperative nausea and vomiting)    Wears glasses     Home Medications Prior to Admission medications   Medication Sig Start Date End Date Taking? Authorizing Provider  albuterol (VENTOLIN HFA) 108 (90 Base) MCG/ACT inhaler Inhale  2 puffs into the lungs every 6 (six) hours as needed for wheezing or shortness of breath (Cough). 02/16/22   Theadora Rama Scales, PA-C  alendronate (FOSAMAX) 70 MG tablet TAKE 1 TABLET BY MOUTH ONCE A WEEK IN THE MORNING 07/04/22   Plotnikov, Georgina Quint, MD  cetirizine (ZYRTEC ALLERGY) 10 MG tablet Take 1 tablet (10 mg total) by mouth at bedtime. Patient not taking: Reported on 10/15/2022 06/22/22 12/19/22  Jarold Motto, PA  Cyanocobalamin (VITAMIN B-12) 5000 MCG SUBL Take 5,000 mcg by mouth in the morning.    [provider]  dicyclomine (BENTYL) 20 MG tablet TAKE 1 TABLET BY MOUTH EVERY 6 HOURS AS NEEDED (ABDOMINAL CRAMPING PAIN). 06/04/22   Armbruster, Willaim Rayas, MD  diphenoxylate-atropine (LOMOTIL) 2.5-0.025 MG tablet Take 1 tablet by mouth 4 (four) times daily as needed for diarrhea or loose stools. 07/04/22   Plotnikov, Georgina Quint, MD  fluticasone (FLONASE) 50 MCG/ACT nasal spray Place 1 spray into both nostrils daily. Begin by using 2 sprays in each nare daily for 3 to 5 days, then decrease to 1 spray in each nare daily. 02/16/22   Theadora Rama Scales, PA-C  guaifenesin (HUMIBID E) 400 MG TABS tablet Take 1 tablet 3 times daily as needed for chest congestion and cough 02/16/22   Lequita Halt,  Lillia Abed Scales, PA-C  Homeopathic Products [facility] ALLERGY RELIEF) GEL Place 1 application  into the nose every other day. At night (Zicam Cold Remedy Nasal Swabs)    [provider]  ibuprofen (ADVIL) 400 MG tablet Take 1 tablet (400 mg total) by mouth every 8 (eight) hours as needed for up to 30 doses. 02/16/22   Theadora Rama Scales, PA-C  levothyroxine (SYNTHROID) 88 MCG tablet Take 1 tablet (88 mcg total) by mouth daily. 11/27/22   Shamleffer, Konrad Dolores, MD  LYSINE PO Take 1 Scoop by mouth daily.    [provider]  MAGNESIUM CITRATE PO Take 500 mg by mouth daily. 250 mg/tablet    [provider]  MELATONIN PO Take 6 mg by mouth at bedtime as needed (sleep).     [provider]  Multiple Vitamins-Minerals (ADULT ONE DAILY GUMMIES PO) Take 2 tablets by mouth daily. Vitafusion MultiVites    [provider]  OVER THE COUNTER MEDICATION Pt taking miralax every day    [provider]  Polyethyl Glycol-Propyl Glycol (SYSTANE) 0.4-0.3 % SOLN Place 1-2 drops into both eyes 3 (three) times daily as needed (dry/irritated eyes.).    [provider]  psyllium (METAMUCIL SMOOTH TEXTURE) 58.6 % powder Take 1 packet by mouth 3 (three) times daily. 07/04/22   Plotnikov, Georgina Quint, MD  TURMERIC-GINGER PO Take 2 tablets by mouth daily. Gummies    [provider]      Allergies    Codeine, Oxycodone-acetaminophen, Soybean-containing drug products, Tramadol, Wheat, Almond (diagnostic), Bismuth-containing compounds, Buspar [buspirone], Carafate [sucralfate], Corn-containing products, Egg-derived products, Gluten meal, Lactose intolerance (gi), Latex, Lentil, Omeprazole, Peanuts [peanut oil], and Pepcid [famotidine]    Review of Systems   Review of Systems  Constitutional:  Negative for fever.  Respiratory:  Positive for shortness of breath.   Cardiovascular:  Positive for chest pain. Negative for leg swelling.  Neurological:  Negative for weakness.  Psychiatric/Behavioral:  The patient is nervous/anxious.     Physical Exam Updated Vital Signs BP (!) 145/74   Pulse 73   Temp 98.6 F (37 C) (Oral)   Resp 14   Ht 1.676 m (5\' 6" )   Wt 70.5 kg   SpO2 96%   BMI 25.09 kg/m  Physical Exam CONSTITUTIONAL: Well developed/well nourished, anxious HEAD: Normocephalic/atraumatic EYES: EOMI/PERRL ENMT: Mucous membranes moist NECK: supple no meningeal signs SPINE/BACK:entire spine nontender Tender in the left thoracic paraspinal region, no bruising or erythema CV: S1/S2 noted, no murmurs/rubs/gallops noted LUNGS: Lungs are clear to auscultation bilaterally, no apparent distress Chest-diffuse left-sided chest wall tenderness.   Breast exam performed with nurse present, no mass no overlying erythema or abscess noted.  Breasts appear symmetric ABDOMEN: soft, nontender GU:no cva tenderness NEURO: Pt is awake/alert/appropriate, moves all extremitiesx4.  No facial droop.   EXTREMITIES: pulses normal/equalx4, full ROM No lower extremity edema or tenderness SKIN: warm, color normal PSYCH: Anxious  ED Results / Procedures / Treatments   Labs (all labs ordered are listed, but only abnormal results are displayed) Labs Reviewed  BASIC METABOLIC PANEL - Abnormal; Notable for the following components:      Result Value   Sodium 131 (*)    Glucose, Bld 121 (*)    All other components within normal limits  CBC  D-DIMER, QUANTITATIVE  TROPONIN I (HIGH SENSITIVITY)    EKG EKG Interpretation Date/Time:  Friday March 22 2023 05:55:24 EST Ventricular Rate:  84 PR Interval:  139 QRS Duration:  88 QT Interval:  368 QTC Calculation: 435 R Axis:   80  Text Interpretation: Sinus rhythm Consider right atrial enlargement No significant change since last tracing Confirmed by Zadie Rhine ([id number]) on 03/22/2023 5:58:21 AM  Radiology DG Chest Port 1 View Result Date: 03/22/2023 CLINICAL DATA:  63 year old female with history of chest pain. EXAM: PORTABLE CHEST 1 VIEW COMPARISON:  Chest x-ray 02/16/2022. FINDINGS: Lung volumes are normal. No consolidative airspace disease. No pleural effusions. No pneumothorax. No pulmonary nodule or mass noted. Pulmonary vasculature and the cardiomediastinal silhouette are within normal limits. IMPRESSION: No radiographic evidence of acute cardiopulmonary disease. Electronically Signed   By: Trudie Reed M.D.   On: 03/22/2023 06:19    Procedures Procedures    Medications Ordered in ED Medications  ketorolac (TORADOL) 15 MG/ML injection 15 mg (15 mg Intravenous Given 03/22/23 0614)  ondansetron (ZOFRAN) injection 4 mg (4 mg Intravenous Given 03/22/23 0634)  fentaNYL (SUBLIMAZE)  injection 50 mcg (50 mcg Intravenous Given 03/22/23 0651)    ED Course/ Medical Decision Making/ A&P Clinical Course as of 03/22/23 0713  Fri Mar 22, 2023  0646 Initial workup overall unremarkable the patient is still having pain.  Will continue to manage pain.  Repeat troponin pending  Given history and exam, I have low suspicion for dissection.  She is low risk for PE, D-dimer negative [DW]  0707 Signed out to Dr. Rhae Hammock with repeat troponin pending [DW]    Clinical Course User Index [DW] Zadie Rhine, MD             HEART Score: 2                    Medical Decision Making Amount and/or Complexity of Data Reviewed Labs: ordered. Radiology: ordered. ECG/medicine tests: ordered.  Risk Prescription drug management.   This patient presents to the ED for concern of chest pain, this involves an extensive number of treatment options, and is a complaint that carries with it a high risk of complications and morbidity.  The differential diagnosis includes but is not limited to acute coronary syndrome, aortic dissection, pulmonary embolism, pericarditis, pneumothorax, pneumonia, myocarditis, pleurisy, esophageal rupture    Comorbidities that complicate the patient evaluation: Patient's presentation is complicated by their history of hypothyroid  Social Determinants of Health: Patient's  mother's recent health issues   increases the complexity of managing their presentation  Additional history obtained: Additional history obtained from spouse Records reviewed Primary Care Documents  Lab Tests: I Ordered, and personally interpreted labs.  The pertinent results include: Initial labs unremarkable  Imaging Studies ordered: I ordered imaging studies including X-ray chest   I independently visualized and interpreted imaging which showed no acute findings I agree with the radiologist interpretation  Cardiac Monitoring: The patient was maintained on a cardiac monitor.  I personally  viewed and interpreted the cardiac monitor which showed an underlying rhythm of:  sinus rhythm  Medicines ordered and prescription drug management: I ordered medication including Toradol for pain Reevaluation of the patient after these medicines showed that the patient    improved  Test Considered: Patient is low risk / negative by heart score, therefore do not feel that cardiac admission is indicated.  Reevaluation: After the interventions noted above, I reevaluated the patient and found that they have :improved  Complexity of problems addressed: Patient's presentation is most consistent with  acute presentation with potential threat to life or bodily function          Final Clinical Impression(s) / ED  Diagnoses Final diagnoses:  Precordial pain    Rx / DC Orders ED Discharge Orders     None         Zadie Rhine, MD 03/22/23 [phone]

## 2023-03-22 NOTE — ED Triage Notes (Signed)
Pt POV with husband from home d/t sudden Chest pressure on left side.  It started in left Scapula to shoulder to left chest.  She states slept funny in a chair but now really bad.  Pt took 650 mg Tylenol and 200 mg Aleve and now 8/10.  Was a 10/10.

## 2023-03-22 NOTE — Discharge Instructions (Addendum)
Please take the ketorolac as needed over the next few days.  Take the muscle relaxer as needed.  Do not drive or drink alcohol taking the muscle relaxers may make you drowsy.  Your workup today was reassuring.  Please follow-up with your doctor.  I have placed a consult to cardiology for outpatient workup.  Please return to the ER for worsening symptoms.

## 2023-03-23 ENCOUNTER — Ambulatory Visit
Admission: EM | Admit: 2023-03-23 | Discharge: 2023-03-23 | Disposition: A | Discharge status: Home / Self Care | Payer: Self-pay | Attending: Family Medicine | Admitting: Family Medicine

## 2023-03-23 DIAGNOSIS — B029 Zoster without complications: Secondary | ICD-10-CM

## 2023-03-23 MED ORDER — VALACYCLOVIR HCL 1 G PO TABS: 1000.0000 mg | ORAL_TABLET | Freq: Three times a day (TID) | ORAL | 0 refills | Status: DC

## 2023-03-23 MED ORDER — ONDANSETRON 4 MG PO TBDP: 4.0000 mg | ORAL_TABLET | Freq: Three times a day (TID) | ORAL | 0 refills | Status: DC | PRN

## 2023-03-23 MED ORDER — GABAPENTIN 100 MG PO CAPS: 100.0000 mg | ORAL_CAPSULE | Freq: Three times a day (TID) | ORAL | 0 refills | Status: DC

## 2023-03-23 NOTE — ED Provider Notes (Signed)
[facility] - URGENT CARE CENTER  Note:  This document was prepared using Conservation officer, historic buildings and may include unintentional dictation errors.  MRN: [id number] DOB: May 29, 1960  Subjective:   Deborah Callahan is a 63 y.o. female presenting for a day history of persistent left-sided chest wall pain, thoracic back pain.  She went to the emergency room yesterday evening.  Had extensive testing done.  Results were unremarkable.  She was prescribed Toradol and Robaxin.  No current facility-administered medications for this encounter.  Current Outpatient Medications:    albuterol (VENTOLIN HFA) 108 (90 Base) MCG/ACT inhaler, Inhale 2 puffs into the lungs every 6 (six) hours as needed for wheezing or shortness of breath (Cough)., Disp: 18 g, Rfl: 5   alendronate (FOSAMAX) 70 MG tablet, TAKE 1 TABLET BY MOUTH ONCE A WEEK IN THE MORNING, Disp: 12 tablet, Rfl: 1   cetirizine (ZYRTEC ALLERGY) 10 MG tablet, Take 1 tablet (10 mg total) by mouth at bedtime. (Patient not taking: Reported on 10/15/2022), Disp: 90 tablet, Rfl: 1   Cyanocobalamin (VITAMIN B-12) 5000 MCG SUBL, Take 5,000 mcg by mouth in the morning., Disp: , Rfl:    dicyclomine (BENTYL) 20 MG tablet, TAKE 1 TABLET BY MOUTH EVERY 6 HOURS AS NEEDED (ABDOMINAL CRAMPING PAIN)., Disp: 30 tablet, Rfl: 0   diphenoxylate-atropine (LOMOTIL) 2.5-0.025 MG tablet, Take 1 tablet by mouth 4 (four) times daily as needed for diarrhea or loose stools., Disp: 40 tablet, Rfl: 0   fluticasone (FLONASE) 50 MCG/ACT nasal spray, Place 1 spray into both nostrils daily. Begin by using 2 sprays in each nare daily for 3 to 5 days, then decrease to 1 spray in each nare daily., Disp: 15.8 mL, Rfl: 2   guaifenesin (HUMIBID E) 400 MG TABS tablet, Take 1 tablet 3 times daily as needed for chest congestion and cough, Disp: 21 tablet, Rfl: 0   Homeopathic Products (ZICAM ALLERGY RELIEF) GEL, Place 1 application  into the nose every other day. At night  (Zicam Cold Remedy Nasal Swabs), Disp: , Rfl:    ibuprofen (ADVIL) 400 MG tablet, Take 1 tablet (400 mg total) by mouth every 8 (eight) hours as needed for up to 30 doses., Disp: 30 tablet, Rfl: 0   ketorolac (TORADOL) 10 MG tablet, Take 1 tablet (10 mg total) by mouth every 6 (six) hours as needed., Disp: 20 tablet, Rfl: 0   levothyroxine (SYNTHROID) 88 MCG tablet, Take 1 tablet (88 mcg total) by mouth daily., Disp: 90 tablet, Rfl: 3   LYSINE PO, Take 1 Scoop by mouth daily., Disp: , Rfl:    MAGNESIUM CITRATE PO, Take 500 mg by mouth daily. 250 mg/tablet, Disp: , Rfl:    MELATONIN PO, Take 6 mg by mouth at bedtime as needed (sleep)., Disp: , Rfl:    methocarbamol (ROBAXIN) 500 MG tablet, Take 1 tablet (500 mg total) by mouth 2 (two) times daily., Disp: 20 tablet, Rfl: 0   Multiple Vitamins-Minerals (ADULT ONE DAILY GUMMIES PO), Take 2 tablets by mouth daily. Vitafusion MultiVites, Disp: , Rfl:    OVER THE COUNTER MEDICATION, Pt taking miralax every day, Disp: , Rfl:    Polyethyl Glycol-Propyl Glycol (SYSTANE) 0.4-0.3 % SOLN, Place 1-2 drops into both eyes 3 (three) times daily as needed (dry/irritated eyes.)., Disp: , Rfl:    psyllium (METAMUCIL SMOOTH TEXTURE) 58.6 % powder, Take 1 packet by mouth 3 (three) times daily., Disp: 283 g, Rfl: 12   TURMERIC-GINGER PO, Take 2 tablets by mouth daily.  Gummies, Disp: , Rfl:    Allergies  Allergen Reactions   Codeine Anaphylaxis, Hives and Itching   Oxycodone-Acetaminophen Hives, Shortness Of Breath, Itching and Swelling   Soybean-Containing Drug Products Hives, Nausea And Vomiting and Swelling   Tramadol Hives, Nausea And Vomiting and Swelling   Wheat Anaphylaxis, Hives, Diarrhea and Swelling   Almond (Diagnostic) Hives, Diarrhea and Nausea And Vomiting   Bismuth-Containing Compounds Other (See Comments)    Ulcer (pt to avoid due to md instruction)   Buspar [Buspirone] Hives and Itching    Bleeding in scalp   Carafate [Sucralfate] Hives, Itching  and Nausea And Vomiting    Bleeding in scalp, headaches   Corn-Containing Products Diarrhea   Egg-Derived Products Hives, Diarrhea and Nausea And Vomiting    Tolerated propofol on multiple occasions with no difficulty   Gluten Meal     Other reaction(s): Unknown   Lactose Intolerance (Gi) Hives, Diarrhea and Nausea And Vomiting   Latex Itching and Other (See Comments)    Skin irritation/ reddness   Lentil Diarrhea and Swelling    Bowel irritation    Omeprazole Hives and Other (See Comments)    Multiple intolerances -- decrease in energy, headaches, neuropathy in hands, throat irritation, changes in appetite, muscle aches, itchiness accompanied by widespread tiny blisters over her body and in her mouth   Peanuts [Peanut Oil] Hives, Diarrhea and Nausea And Vomiting   Pepcid [Famotidine] Other (See Comments)    Unknown     Past Medical History:  Diagnosis Date   Arthritis    degenerative cerv. spine , beginning signs of carpal tunnel syndrome- L hand     Complication of anesthesia    Cystocele    Environmental and seasonal allergies    Family history of anesthesia complication    N&V- mother & sister   GERD (gastroesophageal reflux disease)    DIET CONTROLLED   Headache(784.0)    migraines    History of hiatal hernia    History of kidney stones    Hypothyroidism    IBS (irritable bowel syndrome)    IC (interstitial cystitis)    Mild intermittent asthma    Pelvic relaxation    Pneumonia    PONV (postoperative nausea and vomiting)    Wears glasses      Past Surgical History:  Procedure Laterality Date   ABDOMINAL HYSTERECTOMY     partial hysterectomy   ANTERIOR AND POSTERIOR REPAIR  09/23/2014   Procedure: ANTERIOR (CYSTOCELE) AND POSTERIOR REPAIR (RECTOCELE);  Surgeon: Richardean Chimera, MD;  Location: [facility];  Service: Gynecology;;   ANTERIOR CERVICAL DECOMP/DISCECTOMY FUSION N/A 05/08/2012   Procedure: ACDF C5-7  ANTERIOR CERVICAL  DECOMPRESSION/DISCECTOMY FUSION 2 LEVELS;  Surgeon: Venita Lick, MD;  Location: [facility] OR;  Service: Orthopedics;  Laterality: N/A;   ANTERIOR CERVICAL DECOMP/DISCECTOMY FUSION N/A 02/09/2019   Procedure: ANTERIOR CERVICAL DECOMPRESSION FUSION CERVICAL 3-4, CERVICAL 4-5 WITH INSTRUMENTATION AND ALLOGRAFT;  Surgeon: Estill Bamberg, MD;  Location: [facility] OR;  Service: Orthopedics;  Laterality: N/A;   BIOPSY  10/17/2021   Procedure: BIOPSY;  Surgeon: Benancio Deeds, MD;  Location: [facility];  Service: Gastroenterology;;   COLONOSCOPY  10/19/2005   COLONOSCOPY WITH PROPOFOL N/A 10/17/2021   Procedure: COLONOSCOPY WITH PROPOFOL;  Surgeon: Benancio Deeds, MD;  Location: [facility];  Service: Gastroenterology;  Laterality: N/A;   D & C HYSTEROSCOPE/ RESECTION POLYP/  POSTERIOR AND ENTEROOCELE REPAIRS/  CARDINAL UTEROSACRAL COLPOSUSPENSION /  PERINEAL BODY REPAIR  04/22/2003   ESOPHAGOGASTRODUODENOSCOPY (  EGD) WITH PROPOFOL N/A 10/17/2021   Procedure: ESOPHAGOGASTRODUODENOSCOPY (EGD) WITH PROPOFOL;  Surgeon: Benancio Deeds, MD;  Location: [facility];  Service: Gastroenterology;  Laterality: N/A;   FRACTURE SURGERY Right 2005   Right shoulder with Dr. Thomasena Edis at [facility]   LAPAROSCOPIC ASSISTED VAGINAL HYSTERECTOMY  11/02/2004   w/  Release posterior vaginal scar   LAPAROSCOPIC CHOLECYSTECTOMY  10/11/2001   POLYPECTOMY  10/17/2021   Procedure: POLYPECTOMY;  Surgeon: Benancio Deeds, MD;  Location: [facility];  Service: Gastroenterology;;   Gaspar Bidding DILATION N/A 10/17/2021   Procedure: Gaspar Bidding DILATION;  Surgeon: Benancio Deeds, MD;  Location: [facility];  Service: Gastroenterology;  Laterality: N/A;   TOOTH EXTRACTION     #31   TUBAL LIGATION  1988    Family History  Problem Relation Age of Onset   Cancer Mother        breast   Arthritis/Rheumatoid Mother    Diabetes Mother    Cancer Sister        breast   Cancer Maternal Grandmother        breast    Diabetes Paternal Grandfather    Colon cancer Neg Hx    Stomach cancer Neg Hx    Pancreatic cancer Neg Hx    Esophageal cancer Neg Hx     Social History   Tobacco Use   Smoking status: Never   Smokeless tobacco: Never  Vaping Use   Vaping status: Never Used  Substance Use Topics   Alcohol use: Not Currently   Drug use: No    ROS   Objective:   Vitals: BP 138/80 (BP Location: Right Arm)   Pulse 86   Temp 99.2 F (37.3 C) (Oral)   Resp 16   SpO2 96%   Physical Exam Constitutional:      General: She is not in acute distress.    Appearance: Normal appearance. She is well-developed. She is not ill-appearing, toxic-appearing or diaphoretic.  HENT:     Head: Normocephalic and atraumatic.     Nose: Nose normal.     Mouth/Throat:     Mouth: Mucous membranes are moist.  Eyes:     General: No scleral icterus.       Right eye: No discharge.        Left eye: No discharge.     Extraocular Movements: Extraocular movements intact.  Cardiovascular:     Rate and Rhythm: Normal rate and regular rhythm.     Heart sounds: Normal heart sounds. No murmur heard.    No friction rub. No gallop.  Pulmonary:     Effort: Pulmonary effort is normal. No respiratory distress.     Breath sounds: No stridor. No wheezing, rhonchi or rales.  Chest:     Chest wall: Tenderness present.  Skin:    General: Skin is warm and dry.       Neurological:     General: No focal deficit present.     Mental Status: She is alert and oriented to person, place, and time.  Psychiatric:        Mood and Affect: Mood normal.        Behavior: Behavior normal.     Assessment and Plan :   PDMP not reviewed this encounter.  1. Herpes zoster without complication    Start valacyclovir, gabapentin for neuropathic pain.  Patient requested Zofran for nausea.  Counseled patient on potential for adverse effects with medications prescribed/recommended today, ER and return-to-clinic precautions discussed, patient  verbalized  understanding.    Wallis Bamberg, [place] 03/23/23 [phone]

## 2023-03-23 NOTE — ED Triage Notes (Signed)
Pt with ?spider bite to left breast area-first noticed 1/23 while staying in a hotel-states is painful radiating to left axilla and left upper back-states she was seen at ED to r/o any heart issues-NAD-steady gait

## 2023-03-27 ENCOUNTER — Ambulatory Visit: Payer: Self-pay | Admitting: Family Medicine

## 2023-03-28 ENCOUNTER — Other Ambulatory Visit: Payer: Self-pay | Admitting: Orthopedic Surgery

## 2023-04-01 ENCOUNTER — Telehealth: Payer: Self-pay | Admitting: Physician Assistant

## 2023-04-01 ENCOUNTER — Telehealth: Payer: Self-pay | Admitting: *Deleted

## 2023-04-01 MED ORDER — GABAPENTIN 100 MG PO CAPS: 100.0000 mg | ORAL_CAPSULE | Freq: Three times a day (TID) | ORAL | 0 refills | Status: DC

## 2023-04-01 NOTE — Telephone Encounter (Signed)
Deborah Callahan, please see message. 

## 2023-04-01 NOTE — Telephone Encounter (Signed)
 Please call pt and let her know we can not prescribe the medications she wanted they were prescribed by another provider. Pt will need an appointment.

## 2023-04-01 NOTE — Telephone Encounter (Signed)
 Copied from CRM [id number]. Topic: Clinical - Medication Question >> Apr 01, 2023  1:44 PM Barney Boozer wrote: Reason for CRM: Request For Medication >> Apr 01, 2023  1:45 PM Barney Boozer wrote: Patient is requesting to have some pain medications called in for her, JUST FOR TONIGHT as she has an appointment in the morning but needing something to hold her over until then. Patient is requesting a call back at [phone]

## 2023-04-01 NOTE — Addendum Note (Signed)
 Addended by: Marili Vader J on: 04/01/2023 04:15 PM   Modules accepted: Orders

## 2023-04-01 NOTE — Telephone Encounter (Signed)
 Spoke to pt told her Samantha sent in a 30 day supply of Gabapentin  for you, you can take one 3 times a day. Pt verbalized understanding. Told pt if all she needed was refill [name] said okay to cancel since you are self pay and just seen in the ED and UC. Pt verbalized understanding and said yes just needed medication. She asked if she needed the other meds that were prescribed? Told her no, as long as you finished the Valtrex  as prescribed and you can take Tylenol  for pain if you need something else,but the Gabapentin  should help. Pt verbalized understanding. Told her I will cancel appt for tomorrow. Pt verbalized understanding.

## 2023-04-01 NOTE — Telephone Encounter (Signed)
Please see message and advise. Pt is scheduled to see you tomorrow.

## 2023-04-01 NOTE — Telephone Encounter (Signed)
 Pt is scheduled for tomorrow. Dealing with current Shingles outbreak. Is there something she can take for pain until tomorrow? Please advise

## 2023-04-01 NOTE — Telephone Encounter (Signed)
 Pt sent this msg to admin pool;  Urgent request for 1 gabapentin , capsule, refill. [name], I hope this message finds you well. I am experiencing severe nerve pain from the shingles virus I've had for 8 days. I was prescribed medications from ER dr for only 7 days to help rid my pain. I am in desperate need of a single gabapentin  capsule to help me manage the pain tonight until my apt. with you at 9:40am Tuesday. If there is any way to authorize a 1-time one time refill to manage my pain tonight, I would greatly appreciate it. I spent last night without any medication and it was so miserable. Thank you for your time and consideration. I look forward to seeing you in the morning. Deborah Callahan

## 2023-04-01 NOTE — Telephone Encounter (Signed)
 Copied from CRM [id number]. Topic: Clinical - Medication Refill >> Apr 01, 2023  8:45 AM Annelle Kiel wrote: Most Recent Primary Care Visit:  Provider: Alexander Iba  Department: [facility]  Visit Type: PHYSICAL  Date: 02/27/2023  Medication: valacyclovir  hcl 1,000   ketorolac  tromethamine  gabapentin  100mg      methocarbamol    Has the patient contacted their pharmacy? No (Agent: If no, request that the patient contact the pharmacy for the refill. If patient does not wish to contact the pharmacy document the reason why and proceed with request.) (Agent: If yes, when and what did the pharmacy advise?)  Is this the correct pharmacy for this prescription? Yes If no, delete pharmacy and type the correct one.  This is the patient's preferred pharmacy:  [facility] [address], [place] - [address] [place] [address] Phone: [phone] Fax: [phone]   Has the prescription been filled recently? No  Is the patient out of the medication? Yes  Has the patient been seen for an appointment in the last year OR does the patient have an upcoming appointment? Yes  Can we respond through MyChart? No  Agent: Please be advised that Rx refills may take up to 3 business days. We ask that you follow-up with your pharmacy.

## 2023-04-02 ENCOUNTER — Ambulatory Visit: Payer: Self-pay | Admitting: Physician Assistant

## 2023-04-04 ENCOUNTER — Ambulatory Visit: Payer: Self-pay | Admitting: Physician Assistant

## 2023-04-04 ENCOUNTER — Other Ambulatory Visit: Payer: Self-pay | Admitting: Physician Assistant

## 2023-04-04 MED ORDER — KETOROLAC TROMETHAMINE 10 MG PO TABS: 10.0000 mg | ORAL_TABLET | Freq: Four times a day (QID) | ORAL | 0 refills | Status: DC | PRN

## 2023-04-04 MED ORDER — METHOCARBAMOL 500 MG PO TABS: 500.0000 mg | ORAL_TABLET | Freq: Two times a day (BID) | ORAL | 0 refills | Status: DC

## 2023-04-04 NOTE — Telephone Encounter (Signed)
Please see message below and advise.

## 2023-04-04 NOTE — Telephone Encounter (Signed)
  Chief Complaint: shingles to L shoulder and breast area Symptoms: pain, itching Frequency: jan 22 Pertinent Negatives: Patient denies fever, SOB Disposition: [] ED /[] Urgent Care (no appt availability in office) / [] Appointment(In office/virtual)/ []  [facility]/ [] Home Care/ [] Refused Recommended Disposition /[] [facility]/ [x]  Follow-up with PCP Additional Notes: Pt states that she was recently seen in the ED and UC, they dx and rx meds for shingles. Pt states that she is out of the Toradol and the robaxin, pt is requesting refills of both medications.  Copied from CRM [id number]. Topic: Clinical - Medication Refill >> Apr 04, 2023  9:31 AM Aletta Edouard wrote: Most Recent Primary Care Visit:  Provider: Jarold Motto  Department: [facility]  Visit Type: PHYSICAL  Date: 02/27/2023  Medication: ketorolac (TORADOL) 10 MG tablet  rovaxin patient is out of meds and really needs this medications she is in pan she stated   Has the patient contacted their pharmacy? No (Agent: If no, request that the patient contact the pharmacy for the refill. If patient does not wish to contact the pharmacy document the reason why and proceed with request.) (Agent: If yes, when and what did the pharmacy advise?)  Is this the correct pharmacy for this prescription? Yes If no, delete pharmacy and type the correct one.  This is the patient's preferred pharmacy:  [facility] [address], [place] - [address] [place] [address] Phone: [phone] Fax: [phone]   Has the prescription been filled recently? No  Is the patient out of the medication? Yes  Has the patient been seen for an appointment in the last year OR does the patient have an upcoming appointment? Yes  Can we respond through MyChart? No  Agent: Please be advised that Rx refills may take up to 3 business days. We ask that you follow-up with your  pharmacy. Reason for Disposition  [1] Shingles rash already diagnosed AND [2] weak immune system (e.g., HIV positive, cancer chemotherapy, chronic steroid treatment, splenectomy) AND [3] taking antiviral medication  Answer Assessment - Initial Assessment Questions 1. APPEARANCE of RASH: "Describe the rash."      Looks like shingles 2. LOCATION: "Where is the rash located?"      L shoulder and breast  3. ONSET: "When did the rash start?"      Jan 22, 25 4. ITCHING: "Does the rash itch?" If Yes, ask: "How bad is the itch?"  (Scale 1-10; or mild, moderate, severe)     Severe, cannot sleep 5. PAIN: "Does the rash hurt?" If Yes, ask: "How bad is the pain?"  (Scale 0-10; or none, mild, moderate, severe)    - NONE (0): no pain    - MILD (1-3): doesn't interfere with normal activities     - MODERATE (4-7): interferes with normal activities or awakens from sleep     - SEVERE (8-10): excruciating pain, unable to do any normal activities     8 6. OTHER SYMPTOMS: "Do you have any other symptoms?" (e.g., fever)     denies  Protocols used: Shingles (Zoster)-A-AH

## 2023-04-15 ENCOUNTER — Ambulatory Visit: Payer: Self-pay | Admitting: Physician Assistant

## 2023-04-15 ENCOUNTER — Other Ambulatory Visit: Payer: Self-pay | Admitting: Physician Assistant

## 2023-04-15 NOTE — Telephone Encounter (Signed)
 Copied from CRM [id number]. Topic: Clinical - Red Word Triage >> Apr 15, 2023  4:31 PM Fredrich Romans wrote: Red Word that prompted transfer to Nurse Triage:  Severe pain from shingles   Patient states that she was diagnosed with shingles and has been taking Gabapentin, but is now out and is still experiencing pain. She states that she doubled the dose of the Gabapentin after speaking with her son who is a doctor. Patient states she is still experiencing significant shingles pain. Patient advised to take her medication as prescribed, which she verbalized understanding. Patient states she will go to urgent care tonight to help with the pain and an appointment was scheduled on Wednesday in the office.    Reason for Disposition  [1] Prescription refill request for NON-ESSENTIAL medicine (i.e., no harm to patient if med not taken) AND [2] triager unable to refill per department policy    Made appointment to discuss with PCP  Answer Assessment - Initial Assessment Questions 1. DRUG NAME: "What medicine do you need to have refilled?"     Gabapentin  2. REFILLS REMAINING: "How many refills are remaining?" (Note: The label on the medicine or pill bottle will show how many refills are remaining. If there are no refills remaining, then a renewal may be needed.)     0 4. PRESCRIBING HCP: "Who prescribed it?" Reason: If prescribed by specialist, call should be referred to that group.     Jarold Motto, PA 5. SYMPTOMS: "Do you have any symptoms?"     Shingles pain  Protocols used: Medication Refill and Renewal Call-A-AH

## 2023-04-15 NOTE — Telephone Encounter (Signed)
 Last Fill: 04/01/23 90 tabs/0 refills  Last OV: 02/27/23 Next OV: None Scheduled  Routing to provider for review/authorization.

## 2023-04-15 NOTE — Telephone Encounter (Signed)
 Copied from CRM [id number]. Topic: Clinical - Medication Refill >> Apr 15, 2023  9:37 AM Pascal Lux wrote: Most Recent Primary Care Visit:  Provider: Jarold Motto  Department: [facility]  Visit Type: PHYSICAL  Date: 02/27/2023  Medication: gabapentin (NEURONTIN) 100 MG capsule [[id number]]  Has the patient contacted their pharmacy? Yes (Agent: If no, request that the patient contact the pharmacy for the refill. If patient does not wish to contact the pharmacy document the reason why and proceed with request.) (Agent: If yes, when and what did the pharmacy advise?) no more refills.  Is this the correct pharmacy for this prescription? Yes If no, delete pharmacy and type the correct one.  This is the patient's preferred pharmacy:  [facility] [address], [place] - [address] [place] [address] Phone: [phone] Fax: [phone]   Has the prescription been filled recently? Yes  Is the patient out of the medication? Yes  Has the patient been seen for an appointment in the last year OR does the patient have an upcoming appointment? Yes  Can we respond through MyChart? Yes  Agent: Please be advised that Rx refills may take up to 3 business days. We ask that you follow-up with your pharmacy.

## 2023-04-17 ENCOUNTER — Encounter: Payer: Self-pay | Admitting: Physician Assistant

## 2023-04-17 ENCOUNTER — Ambulatory Visit (INDEPENDENT_AMBULATORY_CARE_PROVIDER_SITE_OTHER): Payer: Self-pay | Admitting: Physician Assistant

## 2023-04-17 ENCOUNTER — Telehealth: Payer: Self-pay | Admitting: *Deleted

## 2023-04-17 VITALS — BP 120/66 | HR 66 | Temp 97.3°F | Ht 66.0 in | Wt 159.8 lb

## 2023-04-17 DIAGNOSIS — F411 Generalized anxiety disorder: Secondary | ICD-10-CM

## 2023-04-17 DIAGNOSIS — B0229 Other postherpetic nervous system involvement: Secondary | ICD-10-CM

## 2023-04-17 MED ORDER — MELOXICAM 7.5 MG PO TABS: 7.5000 mg | ORAL_TABLET | Freq: Every day | ORAL | 1 refills | Status: DC

## 2023-04-17 MED ORDER — GABAPENTIN 100 MG PO CAPS: ORAL_CAPSULE | ORAL | 3 refills | Status: DC

## 2023-04-17 NOTE — Telephone Encounter (Signed)
 Copied from CRM [id number]. Topic: Clinical - Prescription Issue >> Apr 17, 2023  2:36 PM Fredrich Romans wrote: Reason for CRM: [facility] pharmacy called in regarding medication  gabapentin (NEURONTIN) 100 MG capsule And would like to know if 300 mg capsules can be sent in instead of 100mg  and just keep the same directions to make it easier on the patient?

## 2023-04-17 NOTE — Patient Instructions (Signed)
 It was great to see you!  Gabapentin TODAY: 3 x 100 mg tonight TOMORROW: 3 x 100 mg tomorrow at lunch and 3 x 100 mg tomorrow night Friday and beyond: 3 x 100 mg 3 times daily (breakfast, lunch, dinner)  *Titrate OFF gabapentin as you titrated up, if and when you are ready to do so  Meloxicam 7.5 mg daily with food Do not take additional NSAIDs while on this medication  This will replace toradol    Take care,  Jarold Motto PA-C

## 2023-04-17 NOTE — Progress Notes (Signed)
 Deborah Callahan is a 63 y.o. female here for a follow up of a pre-existing problem.  History of Present Illness:   Chief Complaint  Patient presents with   nerve pain    Patient states going on with nerve pain for about 6 weeks now due to shingles.    HPI  Shingles/Nerve Pain Patient complains of nerve pain that has persisted for the past 6 weeks. She attributes her pain to shingles which she was diagnosed with earlier this year. Pain is located around her left breast, left shoulder, and axilla. At times, it radiates down her arm and causes numbness.   Associated symptoms include skin peeling and numbness in fingers.  Reports taking capsaicin cream and Lidocaine 0.4 cream which provide a cooling sensation but doesn't necessarily relief the pain.  Toradol and gabapentin seems to provide most relief of her symptoms and allowing her to sleep throughout the night. She's currently not on any medications as she has ran out. Requesting refills today.  She was also using a tincture made of eucalyptus and peppermint and other ingredients per her son's recommendation who is a doctor. This ointment seems to provide relief of her pain.    Anxiety  She reports undergoing immense stress due to her personal health and her mother's health and recent strokes.  She was placed on Prozac 10 mg daily per her oby as she had a breakdown during her most recent follow up. Feels as if Prozac is managing her anxiety well.   Denies suicidal ideation/hi   Past Medical History:  Diagnosis Date   Arthritis    degenerative cerv. spine , beginning signs of carpal tunnel syndrome- L hand     Complication of anesthesia    Cystocele    Environmental and seasonal allergies    Family history of anesthesia complication    N&V- mother & sister   GERD (gastroesophageal reflux disease)    DIET CONTROLLED   Headache(784.0)    migraines    History of hiatal hernia    History of kidney stones     Hypothyroidism    IBS (irritable bowel syndrome)    IC (interstitial cystitis)    Mild intermittent asthma    Pelvic relaxation    Pneumonia    PONV (postoperative nausea and vomiting)    Wears glasses      Social History   Tobacco Use   Smoking status: Never   Smokeless tobacco: Never  Vaping Use   Vaping status: Never Used  Substance Use Topics   Alcohol use: Not Currently   Drug use: No    Past Surgical History:  Procedure Laterality Date   ABDOMINAL HYSTERECTOMY     partial hysterectomy   ANTERIOR AND POSTERIOR REPAIR  09/23/2014   Procedure: ANTERIOR (CYSTOCELE) AND POSTERIOR REPAIR (RECTOCELE);  Surgeon: Richardean Chimera, MD;  Location: [facility];  Service: Gynecology;;   ANTERIOR CERVICAL DECOMP/DISCECTOMY FUSION N/A 05/08/2012   Procedure: ACDF C5-7  ANTERIOR CERVICAL DECOMPRESSION/DISCECTOMY FUSION 2 LEVELS;  Surgeon: Venita Lick, MD;  Location: [facility];  Service: Orthopedics;  Laterality: N/A;   ANTERIOR CERVICAL DECOMP/DISCECTOMY FUSION N/A 02/09/2019   Procedure: ANTERIOR CERVICAL DECOMPRESSION FUSION CERVICAL 3-4, CERVICAL 4-5 WITH INSTRUMENTATION AND ALLOGRAFT;  Surgeon: Estill Bamberg, MD;  Location: [facility];  Service: Orthopedics;  Laterality: N/A;   BIOPSY  10/17/2021   Procedure: BIOPSY;  Surgeon: Benancio Deeds, MD;  Location: [facility];  Service: Gastroenterology;;   COLONOSCOPY  10/19/2005   COLONOSCOPY  WITH PROPOFOL N/A 10/17/2021   Procedure: COLONOSCOPY WITH PROPOFOL;  Surgeon: Benancio Deeds, MD;  Location: [facility];  Service: Gastroenterology;  Laterality: N/A;   D & C HYSTEROSCOPE/ RESECTION POLYP/  POSTERIOR AND ENTEROOCELE REPAIRS/  CARDINAL UTEROSACRAL COLPOSUSPENSION /  PERINEAL BODY REPAIR  04/22/2003   ESOPHAGOGASTRODUODENOSCOPY (EGD) WITH PROPOFOL N/A 10/17/2021   Procedure: ESOPHAGOGASTRODUODENOSCOPY (EGD) WITH PROPOFOL;  Surgeon: Benancio Deeds, MD;  Location: [facility];  Service: Gastroenterology;   Laterality: N/A;   FRACTURE SURGERY Right 2005   Right shoulder with Dr. Thomasena Edis at [facility]   LAPAROSCOPIC ASSISTED VAGINAL HYSTERECTOMY  11/02/2004   w/  Release posterior vaginal scar   LAPAROSCOPIC CHOLECYSTECTOMY  10/11/2001   POLYPECTOMY  10/17/2021   Procedure: POLYPECTOMY;  Surgeon: Benancio Deeds, MD;  Location: [facility];  Service: Gastroenterology;;   Gaspar Bidding DILATION N/A 10/17/2021   Procedure: Gaspar Bidding DILATION;  Surgeon: Benancio Deeds, MD;  Location: [facility];  Service: Gastroenterology;  Laterality: N/A;   TOOTH EXTRACTION     #31   TUBAL LIGATION  1988    Family History  Problem Relation Age of Onset   Cancer Mother        breast   Arthritis/Rheumatoid Mother    Diabetes Mother    Cancer Sister        breast   Cancer Maternal Grandmother        breast   Diabetes Paternal Grandfather    Colon cancer Neg Hx    Stomach cancer Neg Hx    Pancreatic cancer Neg Hx    Esophageal cancer Neg Hx     Allergies  Allergen Reactions   Codeine Anaphylaxis, Hives and Itching   Oxycodone-Acetaminophen Hives, Shortness Of Breath, Itching and Swelling   Soybean-Containing Drug Products Hives, Nausea And Vomiting and Swelling   Tramadol Hives, Nausea And Vomiting and Swelling   Wheat Anaphylaxis, Hives, Diarrhea and Swelling   Almond (Diagnostic) Hives, Diarrhea and Nausea And Vomiting   Bismuth-Containing Compounds Other (See Comments)    Ulcer (pt to avoid due to md instruction)   Buspar [Buspirone] Hives and Itching    Bleeding in scalp   Carafate [Sucralfate] Hives, Itching and Nausea And Vomiting    Bleeding in scalp, headaches   Corn-Containing Products Diarrhea   Egg-Derived Products Hives, Diarrhea and Nausea And Vomiting    Tolerated propofol on multiple occasions with no difficulty   Gluten Meal     Other reaction(s): Unknown   Lactose Intolerance (Gi) Hives, Diarrhea and Nausea And Vomiting   Latex Itching and Other (See Comments)     Skin irritation/ reddness   Lentil Diarrhea and Swelling    Bowel irritation    Omeprazole Hives and Other (See Comments)    Multiple intolerances -- decrease in energy, headaches, neuropathy in hands, throat irritation, changes in appetite, muscle aches, itchiness accompanied by widespread tiny blisters over her body and in her mouth   Peanuts [Peanut Oil] Hives, Diarrhea and Nausea And Vomiting   Pepcid [Famotidine] Other (See Comments)    Unknown     Current Medications:   Current Outpatient Medications:    albuterol (VENTOLIN HFA) 108 (90 Base) MCG/ACT inhaler, Inhale 2 puffs into the lungs every 6 (six) hours as needed for wheezing or shortness of breath (Cough)., Disp: 18 g, Rfl: 5   alendronate (FOSAMAX) 70 MG tablet, TAKE 1 TABLET BY MOUTH ONCE A WEEK IN THE MORNING, Disp: 12 tablet, Rfl: 1   Cyanocobalamin (VITAMIN B-12) 5000 MCG  SUBL, Take 5,000 mcg by mouth in the morning., Disp: , Rfl:    dicyclomine (BENTYL) 20 MG tablet, TAKE 1 TABLET BY MOUTH EVERY 6 HOURS AS NEEDED (ABDOMINAL CRAMPING PAIN)., Disp: 30 tablet, Rfl: 0   FLUoxetine (PROZAC) 10 MG capsule, Take 10 mg by mouth daily., Disp: , Rfl:    fluticasone (FLONASE) 50 MCG/ACT nasal spray, Place 1 spray into both nostrils daily. Begin by using 2 sprays in each nare daily for 3 to 5 days, then decrease to 1 spray in each nare daily., Disp: 15.8 mL, Rfl: 2   guaifenesin (HUMIBID E) 400 MG TABS tablet, Take 1 tablet 3 times daily as needed for chest congestion and cough, Disp: 21 tablet, Rfl: 0   Homeopathic Products (ZICAM ALLERGY RELIEF) GEL, Place 1 application  into the nose every other day. At night (Zicam Cold Remedy Nasal Swabs), Disp: , Rfl:    ibuprofen (ADVIL) 400 MG tablet, Take 1 tablet (400 mg total) by mouth every 8 (eight) hours as needed for up to 30 doses., Disp: 30 tablet, Rfl: 0   levothyroxine (SYNTHROID) 88 MCG tablet, Take 1 tablet (88 mcg total) by mouth daily., Disp: 90 tablet, Rfl: 3   LYSINE PO,  Take 1 Scoop by mouth daily., Disp: , Rfl:    MAGNESIUM CITRATE PO, Take 500 mg by mouth daily. 250 mg/tablet, Disp: , Rfl:    MELATONIN PO, Take 6 mg by mouth at bedtime as needed (sleep)., Disp: , Rfl:    meloxicam (MOBIC) 7.5 MG tablet, Take 1 tablet (7.5 mg total) by mouth daily., Disp: 30 tablet, Rfl: 1   methocarbamol (ROBAXIN) 500 MG tablet, Take 1 tablet (500 mg total) by mouth 2 (two) times daily., Disp: 20 tablet, Rfl: 0   Multiple Vitamins-Minerals (ADULT ONE DAILY GUMMIES PO), Take 2 tablets by mouth daily. Vitafusion MultiVites, Disp: , Rfl:    ondansetron (ZOFRAN-ODT) 4 MG disintegrating tablet, Take 1 tablet (4 mg total) by mouth every 8 (eight) hours as needed for nausea or vomiting., Disp: 20 tablet, Rfl: 0   OVER THE COUNTER MEDICATION, Pt taking miralax every day, Disp: , Rfl:    Polyethyl Glycol-Propyl Glycol (SYSTANE) 0.4-0.3 % SOLN, Place 1-2 drops into both eyes 3 (three) times daily as needed (dry/irritated eyes.)., Disp: , Rfl:    psyllium (METAMUCIL SMOOTH TEXTURE) 58.6 % powder, Take 1 packet by mouth 3 (three) times daily., Disp: 283 g, Rfl: 12   TURMERIC-GINGER PO, Take 2 tablets by mouth daily. Gummies, Disp: , Rfl:    cetirizine (ZYRTEC ALLERGY) 10 MG tablet, Take 1 tablet (10 mg total) by mouth at bedtime. (Patient not taking: Reported on 10/15/2022), Disp: 90 tablet, Rfl: 1   diphenoxylate-atropine (LOMOTIL) 2.5-0.025 MG tablet, Take 1 tablet by mouth 4 (four) times daily as needed for diarrhea or loose stools. (Patient not taking: Reported on 04/17/2023), Disp: 40 tablet, Rfl: 0   gabapentin (NEURONTIN) 100 MG capsule, 300 mg once on day one, 300 mg twice daily on day 2, and 300 mg 3 times daily on day 3; continue 300 mg three times daily for duration of symptom(s), Disp: 270 capsule, Rfl: 3   valACYclovir (VALTREX) 1000 MG tablet, Take 1 tablet (1,000 mg total) by mouth 3 (three) times daily. (Patient not taking: Reported on 04/17/2023), Disp: 21 tablet, Rfl: 0    Review of Systems:   ROS Negative unless otherwise specified per HPI.  Vitals:   Vitals:   04/17/23 1124  BP: 120/66  Pulse: 66  Temp: (!) 97.3 F (36.3 C)  TempSrc: Temporal  SpO2: 98%  Weight: 159 lb 12.8 oz (72.5 kg)  Height: 5\' 6"  (1.676 m)     Body mass index is 25.79 kg/m.  Physical Exam:   Physical Exam Vitals and nursing note reviewed.  Constitutional:      General: She is not in acute distress.    Appearance: She is well-developed. She is not ill-appearing or toxic-appearing.  Cardiovascular:     Rate and Rhythm: Normal rate and regular rhythm.     Pulses: Normal pulses.     Heart sounds: Normal heart sounds, S1 normal and S2 normal.  Pulmonary:     Effort: Pulmonary effort is normal.     Breath sounds: Normal breath sounds.  Skin:    General: Skin is warm and dry.     Comments: Erythematous patches around T3-T4 dermatome of left side of body - most pronounced on back  Neurological:     Mental Status: She is alert.     GCS: GCS eye subscore is 4. GCS verbal subscore is 5. GCS motor subscore is 6.  Psychiatric:        Mood and Affect: Mood is anxious.        Speech: Speech normal.        Behavior: Behavior normal. Behavior is cooperative.     Assessment and Plan:   Postherpetic neuralgia Uncontrolled Advised as follows:  Gabapentin TODAY: 3 x 100 mg tonight TOMORROW: 3 x 100 mg tomorrow at lunch and 3 x 100 mg tomorrow night Friday and beyond: 3 x 100 mg 3 times daily (breakfast, lunch, dinner) *Titrate OFF gabapentin as you titrated up, if and when you are ready to do so  Meloxicam 7.5 mg daily with food Do not take additional NSAIDs while on this medication  This will replace toradol   GAD (generalized anxiety disorder) Overall stable per patient Continue Prozac 10 mg daily per gynecology  Follow-up with [place] as needed  Denies suicidal ideation/hi     Jarold Motto, PA-C  I,Safa M Kadhim,acting as a scribe for [employer],  PA.,have documented all relevant documentation on the behalf of Jarold Motto, PA,as directed by  Jarold Motto, PA while in the presence of Jarold Motto, [place].   I, Jarold Motto, [place], have reviewed all documentation for this visit. The documentation on 04/17/23 for the exam, diagnosis, procedures, and orders are all accurate and complete.

## 2023-04-17 NOTE — Telephone Encounter (Signed)
 [facility] pharmacy and spoke Fayrene Fearing told him patient requested 100 mg capsule due to they are smaller can not swallow larger capsules. Fayrene Fearing verbalized understanding and will fill Rx as requested.

## 2023-04-18 ENCOUNTER — Telehealth: Payer: Self-pay | Admitting: *Deleted

## 2023-04-18 MED ORDER — VALACYCLOVIR HCL 1 G PO TABS: 1000.0000 mg | ORAL_TABLET | Freq: Three times a day (TID) | ORAL | 0 refills | Status: DC

## 2023-04-18 NOTE — Addendum Note (Signed)
 Addended by: Jimmye Norman on: 04/18/2023 12:09 PM   Modules accepted: Orders

## 2023-04-18 NOTE — Telephone Encounter (Signed)
 Copied from CRM [id number]. Topic: Clinical - Medical Advice >> Apr 18, 2023  9:43 AM Corin V wrote: Reason for CRM: Patient is wanting to know if the Valtrex for her virus can prevent additional blisters from popping up. Her husband found another blister on her neck last night. She would like to have a refill sent in if it can help prevent additional blisters from popping up. Please let her know if a refill can be sent in to [facility] on [facility].

## 2023-04-18 NOTE — Telephone Encounter (Signed)
 Please see message and advise

## 2023-04-18 NOTE — Telephone Encounter (Signed)
 Spoke to pt told her I sent Rx for Valtrex to the pharmacy. Pt verbalized understanding.

## 2023-04-18 NOTE — Telephone Encounter (Signed)
 Discussed with Lelon Mast that patient had a new blister popped up on her neck. Lelon Mast said okay send in Valtrex 1000 mg TID x 7 days.

## 2023-05-10 ENCOUNTER — Ambulatory Visit: Payer: Self-pay | Admitting: Cardiovascular Disease

## 2023-06-20 ENCOUNTER — Encounter (HOSPITAL_BASED_OUTPATIENT_CLINIC_OR_DEPARTMENT_OTHER): Payer: Self-pay

## 2023-06-20 ENCOUNTER — Ambulatory Visit (HOSPITAL_BASED_OUTPATIENT_CLINIC_OR_DEPARTMENT_OTHER): Admit: 2023-06-20 | Payer: Self-pay | Admitting: Orthopedic Surgery

## 2023-06-20 SURGERY — EXCISION METACARPAL MASS
Anesthesia: Choice | Site: Thumb | Laterality: Right

## 2023-10-12 ENCOUNTER — Other Ambulatory Visit: Payer: Self-pay

## 2023-10-12 ENCOUNTER — Emergency Department (HOSPITAL_BASED_OUTPATIENT_CLINIC_OR_DEPARTMENT_OTHER): Payer: Self-pay

## 2023-10-12 ENCOUNTER — Emergency Department (HOSPITAL_BASED_OUTPATIENT_CLINIC_OR_DEPARTMENT_OTHER)
Admission: EM | Admit: 2023-10-12 | Discharge: 2023-10-13 | Disposition: A | Discharge status: Home / Self Care | Payer: Self-pay | Attending: Emergency Medicine | Admitting: Emergency Medicine

## 2023-10-12 ENCOUNTER — Encounter (HOSPITAL_BASED_OUTPATIENT_CLINIC_OR_DEPARTMENT_OTHER): Payer: Self-pay

## 2023-10-12 DIAGNOSIS — Z91040 Latex allergy status: Secondary | ICD-10-CM | POA: Insufficient documentation

## 2023-10-12 DIAGNOSIS — E039 Hypothyroidism, unspecified: Secondary | ICD-10-CM | POA: Insufficient documentation

## 2023-10-12 DIAGNOSIS — Z91010 Allergy to peanuts: Secondary | ICD-10-CM | POA: Insufficient documentation

## 2023-10-12 DIAGNOSIS — N3289 Other specified disorders of bladder: Secondary | ICD-10-CM | POA: Insufficient documentation

## 2023-10-12 LAB — URINALYSIS, ROUTINE W REFLEX MICROSCOPIC
Bilirubin Urine: NEGATIVE
Glucose, UA: NEGATIVE mg/dL
Hgb urine dipstick: NEGATIVE
Ketones, ur: NEGATIVE mg/dL
Leukocytes,Ua: NEGATIVE
Nitrite: POSITIVE — AB
Protein, ur: NEGATIVE mg/dL
Specific Gravity, Urine: 1.009 (ref 1.005–1.030)
pH: 6 (ref 5.0–8.0)

## 2023-10-12 LAB — CBC
HCT: 44.3 % (ref 36.0–46.0)
Hemoglobin: 15.2 g/dL — ABNORMAL HIGH (ref 12.0–15.0)
MCH: 31.3 pg (ref 26.0–34.0)
MCHC: 34.3 g/dL (ref 30.0–36.0)
MCV: 91.3 fL (ref 80.0–100.0)
Platelets: 220 K/uL (ref 150–400)
RBC: 4.85 MIL/uL (ref 3.87–5.11)
RDW: 12.2 % (ref 11.5–15.5)
WBC: 4.8 K/uL (ref 4.0–10.5)
nRBC: 0 % (ref 0.0–0.2)

## 2023-10-12 LAB — COMPREHENSIVE METABOLIC PANEL WITH GFR
ALT: 31 U/L (ref 0–44)
AST: 29 U/L (ref 15–41)
Albumin: 4.9 g/dL (ref 3.5–5.0)
Alkaline Phosphatase: 51 U/L (ref 38–126)
Anion gap: 15 (ref 5–15)
BUN: 13 mg/dL (ref 8–23)
CO2: 22 mmol/L (ref 22–32)
Calcium: 10.1 mg/dL (ref 8.9–10.3)
Chloride: 103 mmol/L (ref 98–111)
Creatinine, Ser: 0.71 mg/dL (ref 0.44–1.00)
GFR, Estimated: >60 mL/min (ref >60)
Glucose, Bld: 111 mg/dL — ABNORMAL HIGH (ref 70–99)
Potassium: 4.1 mmol/L (ref 3.5–5.1)
Sodium: 139 mmol/L (ref 135–145)
Total Bilirubin: 0.4 mg/dL (ref 0.0–1.2)
Total Protein: 7.9 g/dL (ref 6.5–8.1)

## 2023-10-12 LAB — LIPASE, BLOOD: Lipase: 29 U/L (ref 11–51)

## 2023-10-12 MED ORDER — LACTATED RINGERS IV BOLUS
1000.0000 mL | Freq: Once | INTRAVENOUS | Status: AC
  Administered 2023-10-12: 1000 mL via INTRAVENOUS

## 2023-10-12 MED ORDER — FENTANYL CITRATE PF 50 MCG/ML IJ SOSY
50.0000 ug | PREFILLED_SYRINGE | Freq: Once | INTRAMUSCULAR | Status: AC
  Administered 2023-10-12: 50 ug via INTRAVENOUS
  Filled 2023-10-12: qty 1

## 2023-10-12 MED ORDER — ONDANSETRON HCL 4 MG/2ML IJ SOLN
4.0000 mg | Freq: Once | INTRAMUSCULAR | Status: AC
  Administered 2023-10-12: 4 mg via INTRAVENOUS
  Filled 2023-10-12: qty 2

## 2023-10-12 MED ORDER — IOHEXOL 300 MG/ML  SOLN
100.0000 mL | Freq: Once | INTRAMUSCULAR | Status: AC | PRN
  Administered 2023-10-12: 80 mL via INTRAVENOUS

## 2023-10-12 NOTE — ED Provider Notes (Signed)
 [place] EMERGENCY DEPARTMENT AT [facility] Provider Note   CSN: [id number] Arrival date & time: 10/12/23  2144     Patient presents with: Abdominal Pain   Deborah Callahan is a 63 y.o. female.  {Add pertinent medical, surgical, social history, OB history to HPI:32947} HPI       63 year old female with a history of interstitial cystitis, hypothyroidism, nephrolithiasis, who presents with concern for right lower quadrant abdominal pain.  Reports that symptoms began around 130 today and slowly worsened and now the pain is severe and unbearable.  It is worse with movement.  She had nausea and 2 episodes of vomiting, but feels better after receiving other medication here.  She has had some diarrhea today as well with about 10 episodes.  Denies fever.  Reports she has had some urinary symptoms.    Past Medical History:  Diagnosis Date   Arthritis    degenerative cerv. spine , beginning signs of carpal tunnel syndrome- L hand     Complication of anesthesia    Cystocele    Environmental and seasonal allergies    Family history of anesthesia complication    N&V- mother & sister   GERD (gastroesophageal reflux disease)    DIET CONTROLLED   Headache(784.0)    migraines    History of hiatal hernia    History of kidney stones    Hypothyroidism    IBS (irritable bowel syndrome)    IC (interstitial cystitis)    Mild intermittent asthma    Pelvic relaxation    Pneumonia    PONV (postoperative nausea and vomiting)    Wears glasses      Prior to Admission medications   Medication Sig Start Date End Date Taking? Authorizing Provider  albuterol  (VENTOLIN  HFA) 108 (90 Base) MCG/ACT inhaler Inhale 2 puffs into the lungs every 6 (six) hours as needed for wheezing or shortness of breath (Cough). 02/16/22   Joesph Shaver Scales, PA-C  alendronate  (FOSAMAX ) 70 MG tablet TAKE 1 TABLET BY MOUTH ONCE A WEEK IN THE MORNING 07/04/22   Plotnikov, Karlynn GAILS, MD  cetirizine   (ZYRTEC  ALLERGY) 10 MG tablet Take 1 tablet (10 mg total) by mouth at bedtime. Patient not taking: Reported on 10/15/2022 06/22/22 12/19/22  Job Lukes, PA  Cyanocobalamin (VITAMIN B-12) 5000 MCG SUBL Take 5,000 mcg by mouth in the morning.    [provider]  dicyclomine  (BENTYL ) 20 MG tablet TAKE 1 TABLET BY MOUTH EVERY 6 HOURS AS NEEDED (ABDOMINAL CRAMPING PAIN). 06/04/22   Armbruster, Elspeth SQUIBB, MD  diphenoxylate -atropine  (LOMOTIL ) 2.5-0.025 MG tablet Take 1 tablet by mouth 4 (four) times daily as needed for diarrhea or loose stools. Patient not taking: Reported on 04/17/2023 07/04/22   Plotnikov, Aleksei V, MD  FLUoxetine (PROZAC) 10 MG capsule Take 10 mg by mouth daily.    [provider]  fluticasone  (FLONASE ) 50 MCG/ACT nasal spray Place 1 spray into both nostrils daily. Begin by using 2 sprays in each nare daily for 3 to 5 days, then decrease to 1 spray in each nare daily. 02/16/22   Joesph Shaver Scales, PA-C  gabapentin  (NEURONTIN ) 100 MG capsule 300 mg once on day one, 300 mg twice daily on day 2, and 300 mg 3 times daily on day 3; continue 300 mg three times daily for duration of symptom(s) 04/17/23   Job Lukes, PA  guaifenesin  (HUMIBID E) 400 MG TABS tablet Take 1 tablet 3 times daily as needed for chest congestion and cough 02/16/22  Joesph Shaver Scales, PA-C  Homeopathic Products [facility] ALLERGY RELIEF) GEL Place 1 application  into the nose every other day. At night (Zicam Cold Remedy Nasal Swabs)    [provider]  ibuprofen  (ADVIL ) 400 MG tablet Take 1 tablet (400 mg total) by mouth every 8 (eight) hours as needed for up to 30 doses. 02/16/22   Joesph Shaver Scales, PA-C  levothyroxine  (SYNTHROID ) 88 MCG tablet Take 1 tablet (88 mcg total) by mouth daily. 11/27/22   Shamleffer, Ibtehal Jaralla, MD  LYSINE PO Take 1 Scoop by mouth daily.    [provider]  MAGNESIUM CITRATE PO Take 500 mg by mouth daily. 250 mg/tablet    [provider]  MELATONIN PO Take 6 mg by mouth at bedtime as needed (sleep).    [provider]  meloxicam  (MOBIC ) 7.5 MG tablet Take 1 tablet (7.5 mg total) by mouth daily. 04/17/23   Job Lukes, PA  methocarbamol  (ROBAXIN ) 500 MG tablet Take 1 tablet (500 mg total) by mouth 2 (two) times daily. 04/04/23   Job Lukes, PA  Multiple Vitamins-Minerals (ADULT ONE DAILY GUMMIES PO) Take 2 tablets by mouth daily. Vitafusion MultiVites    [provider]  ondansetron  (ZOFRAN -ODT) 4 MG disintegrating tablet Take 1 tablet (4 mg total) by mouth every 8 (eight) hours as needed for nausea or vomiting. 03/23/23   Christopher Savannah, PA-C  OVER THE COUNTER MEDICATION Pt taking miralax  every day    [provider]  Polyethyl Glycol-Propyl Glycol (SYSTANE) 0.4-0.3 % SOLN Place 1-2 drops into both eyes 3 (three) times daily as needed (dry/irritated eyes.).    [provider]  psyllium (METAMUCIL SMOOTH TEXTURE) 58.6 % powder Take 1 packet by mouth 3 (three) times daily. 07/04/22   Plotnikov, Aleksei V, MD  TURMERIC-GINGER PO Take 2 tablets by mouth daily. Gummies    [provider]  valACYclovir  (VALTREX ) 1000 MG tablet Take 1 tablet (1,000 mg total) by mouth 3 (three) times daily. 04/18/23   Job Lukes, PA    Allergies: Codeine, Oxycodone -acetaminophen , Soybean-containing drug products, Tramadol, Wheat, Almond (diagnostic), Bismuth-containing compounds, Buspar  [buspirone ], Carafate  [sucralfate ], Corn-containing products, Egg-derived products, Gluten meal, Lactose intolerance (gi), Latex, Lentil, Omeprazole , Peanuts [peanut oil], and Pepcid  [famotidine ]    Review of Systems  Updated Vital Signs BP 121/66   Pulse 88   Temp 98.1 F (36.7 C) (Oral)   Resp (!) 22   SpO2 96%   Physical Exam  (all labs ordered are listed, but only abnormal results are displayed) Labs Reviewed  COMPREHENSIVE METABOLIC PANEL WITH GFR - Abnormal; Notable for the following  components:      Result Value   Glucose, Bld 111 (*)    All other components within normal limits  CBC - Abnormal; Notable for the following components:   Hemoglobin 15.2 (*)    All other components within normal limits  URINALYSIS, ROUTINE W REFLEX MICROSCOPIC - Abnormal; Notable for the following components:   Nitrite POSITIVE (*)    Bacteria, UA RARE (*)    All other components within normal limits  LIPASE, BLOOD    EKG: None  Radiology: No results found.  {Document cardiac monitor, telemetry assessment procedure when appropriate:32947} Procedures   Medications Ordered in the ED  fentaNYL  (SUBLIMAZE ) injection 50 mcg (50 mcg Intravenous Given 10/12/23 2203)  ondansetron  (ZOFRAN ) injection 4 mg (4 mg Intravenous Given 10/12/23 2203)  lactated ringers  bolus 1,000 mL (1,000 mLs Intravenous New Bag/Given 10/12/23 2207)  fentaNYL  (SUBLIMAZE ) injection 50  mcg (50 mcg Intravenous Given 10/12/23 2312)  iohexol  (OMNIPAQUE ) 300 MG/ML solution 100 mL (80 mLs Intravenous Contrast Given 10/12/23 2312)      {Click here for ABCD2, HEART and other calculators REFRESH Note before signing:1}                              Medical Decision Making Amount and/or Complexity of Data Reviewed Labs: ordered. Radiology: ordered.  Risk Prescription drug management.   ***  {Document critical care time when appropriate  Document review of labs and clinical decision tools ie CHADS2VASC2, etc  Document your independent review of radiology images and any outside records  Document your discussion with family members, caretakers and with consultants  Document social determinants of health affecting pt's care  Document your decision making why or why not admission, treatments were needed:32947:::1}   Final diagnoses:  None    ED Discharge Orders     None

## 2023-10-12 NOTE — ED Triage Notes (Addendum)
 Pt presents via POV c/o RLQ abd pain/right sided pelvic pain x7 hours. Reports pain is continuing to worsen. +N/V. Took Tylenol  and AZO at home without relieft or improvement in pain.

## 2023-10-12 NOTE — ED Notes (Signed)
 Patient transported to CT

## 2023-10-13 MED ORDER — HYOSCYAMINE SULFATE 0.125 MG SL SUBL: 0.1250 mg | SUBLINGUAL_TABLET | SUBLINGUAL | 0 refills | Status: DC | PRN

## 2023-10-13 MED ORDER — HYDROCODONE-ACETAMINOPHEN 5-325 MG PO TABS: 1.0000 | ORAL_TABLET | ORAL | 0 refills | Status: DC | PRN

## 2023-10-13 MED ORDER — HYDROMORPHONE HCL 1 MG/ML IJ SOLN
0.5000 mg | Freq: Once | INTRAMUSCULAR | Status: AC
  Administered 2023-10-13: 0.5 mg via INTRAVENOUS
  Filled 2023-10-13: qty 1

## 2023-10-13 MED ORDER — FOSFOMYCIN TROMETHAMINE 3 G PO PACK
3.0000 g | PACK | Freq: Once | ORAL | Status: AC
  Administered 2023-10-13: 3 g via ORAL
  Filled 2023-10-13: qty 3

## 2023-10-13 NOTE — ED Provider Notes (Signed)
 Patient signed out to me by Dr. Dreama.  Patient with right lower quadrant abdominal pain of unclear etiology.  CT scan pending at signout.  Patient CT has been performed and read by radiology.  No acute pathology noted.  Specifically appendix is normal.  Patient does have a history of interstitial cystitis.  Suspect that her pain is secondary to this.  Discussed pain management strategies with her.  Will give her Levsin  to use for possible spasms, Vicodin to use for pain (reports she has tolerated in past).  She continue Advil .  She did have positive nitrite on her urinalysis, will give a dose of fosfomycin here in the ED.  Follow-up with her bladder specialist.   Haze Lonni PARAS, MD 10/13/23 [phone]

## 2024-02-02 ENCOUNTER — Ambulatory Visit
Admission: EM | Admit: 2024-02-02 | Discharge: 2024-02-02 | Disposition: A | Discharge status: Home / Self Care | Payer: Self-pay | Attending: Family Medicine | Admitting: Family Medicine

## 2024-02-02 ENCOUNTER — Other Ambulatory Visit: Payer: Self-pay

## 2024-02-02 ENCOUNTER — Encounter: Payer: Self-pay | Admitting: Emergency Medicine

## 2024-02-02 DIAGNOSIS — B9689 Other specified bacterial agents as the cause of diseases classified elsewhere: Secondary | ICD-10-CM

## 2024-02-02 DIAGNOSIS — J019 Acute sinusitis, unspecified: Secondary | ICD-10-CM

## 2024-02-02 MED ORDER — AMOXICILLIN-POT CLAVULANATE 875-125 MG PO TABS: 1.0000 | ORAL_TABLET | Freq: Two times a day (BID) | ORAL | 0 refills | Status: AC

## 2024-02-02 MED ORDER — PREDNISONE 20 MG PO TABS: 40.0000 mg | ORAL_TABLET | Freq: Every day | ORAL | 0 refills | Status: AC

## 2024-02-02 NOTE — ED Triage Notes (Signed)
 Patient c/o possible bronchitis or a sinus infection that started a couple of days ago.  Productive cough, sinus pressure and pain, nasal drainage.  Patient has taken Mucinex  DM, Advil  Sinus, Nasacort  nasal spray.

## 2024-02-02 NOTE — Discharge Instructions (Signed)
 Make sure you are drinking lots of water Continue sinus rinses, as tolerated Take Augmentin  2 times a day for a week.  Take 2 doses today Start prednisone  once a day for 5 days See your doctor if not improving by next week

## 2024-02-02 NOTE — ED Provider Notes (Signed)
 [facility]    CSN: [id number] Arrival date & time: 02/02/24  0901      History   Chief Complaint Chief Complaint  Patient presents with   Bronchitis    HPI Deborah Callahan is a 63 y.o. female.   Patient states that she has respiratory symptoms with sinus drainage, bloody green mucus, coughing, chest congestion, ear pain.  Not improving with over-the-counter medications.  Feels that she has a sinus infection.  Husband is currently on antibiotics for a sinus infection.    Past Medical History:  Diagnosis Date   Arthritis    degenerative cerv. spine , beginning signs of carpal tunnel syndrome- L hand     Complication of anesthesia    Cystocele    Environmental and seasonal allergies    Family history of anesthesia complication    N&V- mother & sister   GERD (gastroesophageal reflux disease)    DIET CONTROLLED   Headache(784.0)    migraines    History of hiatal hernia    History of kidney stones    Hypothyroidism    IBS (irritable bowel syndrome)    IC (interstitial cystitis)    Mild intermittent asthma    Pelvic relaxation    Pneumonia    PONV (postoperative nausea and vomiting)    Wears glasses     Patient Active Problem List   Diagnosis Date Noted   Elevated BP without diagnosis of hypertension 07/04/2022   Chronic diarrhea    Benign neoplasm of colon    Rectal bleeding    Gastroesophageal reflux disease    Bloating    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   Anxiety 06/07/2020   Constipation 06/07/2020   Generalized anxiety disorder 06/07/2020   Hypothyroidism 06/07/2020   Leukocytopenia 06/07/2020   Sacroiliitis, not elsewhere classified 06/07/2020   Ulnar neuropathy 06/07/2020   Dysphagia 05/23/2020   Hoarseness 05/23/2020   Myelopathy (HCC) 02/09/2019   Chest pain of uncertain etiology 06/18/2018   Pelvic relaxation 09/23/2014    Class: Present on Admission   Acute cystitis without hematuria 04/01/2014   Cervical disc disorder  with radiculopathy of cervical region 04/29/2012   Chronic interstitial cystitis 05/16/2011   Dysuria 05/16/2011   Increased frequency of urination 05/16/2011   Urinary urgency 05/16/2011    Past Surgical History:  Procedure Laterality Date   ABDOMINAL HYSTERECTOMY     partial hysterectomy   ANTERIOR AND POSTERIOR REPAIR  09/23/2014   Procedure: ANTERIOR (CYSTOCELE) AND POSTERIOR REPAIR (RECTOCELE);  Surgeon: Norleen Skill, MD;  Location: [facility];  Service: Gynecology;;   ANTERIOR CERVICAL DECOMP/DISCECTOMY FUSION N/A 05/08/2012   Procedure: ACDF C5-7  ANTERIOR CERVICAL DECOMPRESSION/DISCECTOMY FUSION 2 LEVELS;  Surgeon: Donaciano Sprang, MD;  Location: [facility];  Service: Orthopedics;  Laterality: N/A;   ANTERIOR CERVICAL DECOMP/DISCECTOMY FUSION N/A 02/09/2019   Procedure: ANTERIOR CERVICAL DECOMPRESSION FUSION CERVICAL 3-4, CERVICAL 4-5 WITH INSTRUMENTATION AND ALLOGRAFT;  Surgeon: Beuford Anes, MD;  Location: [facility];  Service: Orthopedics;  Laterality: N/A;   BIOPSY  10/17/2021   Procedure: BIOPSY;  Surgeon: Leigh Elspeth SQUIBB, MD;  Location: [facility];  Service: Gastroenterology;;   COLONOSCOPY  10/19/2005   COLONOSCOPY WITH PROPOFOL  N/A 10/17/2021   Procedure: COLONOSCOPY WITH PROPOFOL ;  Surgeon: Leigh Elspeth SQUIBB, MD;  Location: [facility];  Service: Gastroenterology;  Laterality: N/A;   D & C HYSTEROSCOPE/ RESECTION POLYP/  POSTERIOR AND ENTEROOCELE REPAIRS/  CARDINAL UTEROSACRAL COLPOSUSPENSION /  PERINEAL BODY REPAIR  04/22/2003   ESOPHAGOGASTRODUODENOSCOPY (EGD) WITH PROPOFOL   N/A 10/17/2021   Procedure: ESOPHAGOGASTRODUODENOSCOPY (EGD) WITH PROPOFOL ;  Surgeon: Leigh Elspeth SQUIBB, MD;  Location: [facility];  Service: Gastroenterology;  Laterality: N/A;   FRACTURE SURGERY Right 2005   Right shoulder with Dr. Gerome at [facility]   LAPAROSCOPIC ASSISTED VAGINAL HYSTERECTOMY  11/02/2004   w/  Release posterior vaginal scar   LAPAROSCOPIC  CHOLECYSTECTOMY  10/11/2001   POLYPECTOMY  10/17/2021   Procedure: POLYPECTOMY;  Surgeon: Leigh Elspeth SQUIBB, MD;  Location: [facility];  Service: Gastroenterology;;   HARLEY DILATION N/A 10/17/2021   Procedure: HARLEY DILATION;  Surgeon: Leigh Elspeth SQUIBB, MD;  Location: [facility];  Service: Gastroenterology;  Laterality: N/A;   TOOTH EXTRACTION     #31   TUBAL LIGATION  1988    OB History   No obstetric history on file.      Home Medications    Prior to Admission medications  Medication Sig Start Date End Date Taking? Authorizing Provider  albuterol  (VENTOLIN  HFA) 108 (90 Base) MCG/ACT inhaler Inhale 2 puffs into the lungs every 6 (six) hours as needed for wheezing or shortness of breath (Cough). 02/16/22  Yes Joesph Shaver Scales, PA-C  alendronate  (FOSAMAX ) 70 MG tablet TAKE 1 TABLET BY MOUTH ONCE A WEEK IN THE MORNING 07/04/22  Yes Plotnikov, Aleksei V, MD  amoxicillin -clavulanate (AUGMENTIN ) 875-125 MG tablet Take 1 tablet by mouth every 12 (twelve) hours. 02/02/24  Yes Maranda Jamee Jacob, MD  Cyanocobalamin (VITAMIN B-12) 5000 MCG SUBL Take 5,000 mcg by mouth in the morning.   Yes [provider]  dicyclomine  (BENTYL ) 20 MG tablet TAKE 1 TABLET BY MOUTH EVERY 6 HOURS AS NEEDED (ABDOMINAL CRAMPING PAIN). 06/04/22  Yes Armbruster, Elspeth SQUIBB, MD  guaifenesin  (HUMIBID E) 400 MG TABS tablet Take 1 tablet 3 times daily as needed for chest congestion and cough 02/16/22  Yes Joesph Shaver Scales, PA-C  levothyroxine  (SYNTHROID ) 88 MCG tablet Take 1 tablet (88 mcg total) by mouth daily. 11/27/22  Yes Shamleffer, Ibtehal Jaralla, MD  LYSINE PO Take 1 Scoop by mouth daily.   Yes [provider]  MAGNESIUM CITRATE PO Take 500 mg by mouth daily. 250 mg/tablet   Yes [provider]  MELATONIN PO Take 6 mg by mouth at bedtime as needed (sleep).   Yes [provider]  Multiple Vitamins-Minerals (ADULT ONE DAILY GUMMIES PO) Take 2 tablets by mouth  daily. Vitafusion MultiVites   Yes [provider]  OVER THE COUNTER MEDICATION Pt taking miralax  every day   Yes [provider]  Polyethyl Glycol-Propyl Glycol (SYSTANE) 0.4-0.3 % SOLN Place 1-2 drops into both eyes 3 (three) times daily as needed (dry/irritated eyes.).   Yes [provider]  predniSONE  (DELTASONE ) 20 MG tablet Take 2 tablets (40 mg total) by mouth daily with breakfast. 02/02/24  Yes Maranda Jamee Jacob, MD  TURMERIC-GINGER PO Take 2 tablets by mouth daily. Gummies   Yes [provider]  cetirizine  (ZYRTEC  ALLERGY) 10 MG tablet Take 1 tablet (10 mg total) by mouth at bedtime. Patient not taking: Reported on 10/15/2022 06/22/22 12/19/22  Job Lukes, PA  diphenoxylate -atropine  (LOMOTIL ) 2.5-0.025 MG tablet Take 1 tablet by mouth 4 (four) times daily as needed for diarrhea or loose stools. Patient not taking: Reported on 04/17/2023 07/04/22   Plotnikov, Karlynn GAILS, MD  ibuprofen  (ADVIL ) 400 MG tablet Take 1 tablet (400 mg total) by mouth every 8 (eight) hours as needed for up to 30 doses. 02/16/22   Joesph Shaver Scales, PA-C  Family History Family History  Problem Relation Age of Onset   Cancer Mother        breast   Arthritis/Rheumatoid Mother    Diabetes Mother    Cancer Sister        breast   Cancer Maternal Grandmother        breast   Diabetes Paternal Grandfather    Colon cancer Neg Hx    Stomach cancer Neg Hx    Pancreatic cancer Neg Hx    Esophageal cancer Neg Hx     Social History Social History[1]   Allergies   Codeine, Oxycodone -acetaminophen , Soybean-containing drug products, Tramadol, Wheat, Almond (diagnostic), Bismuth-containing compounds, Buspar  [buspirone ], Carafate  [sucralfate ], Corn-containing products, Egg protein-containing drug products, Gluten meal, Lactose intolerance (gi), Latex, Lentil, Omeprazole , Peanuts [peanut oil], and Pepcid  [famotidine ]   Review of Systems Review of Systems See  HPI  Physical Exam Triage Vital Signs ED Triage Vitals  Encounter Vitals Group     BP 02/02/24 0939 122/66     Girls Systolic BP Percentile --      Girls Diastolic BP Percentile --      Boys Systolic BP Percentile --      Boys Diastolic BP Percentile --      Pulse Rate 02/02/24 0939 81     Resp 02/02/24 0939 20     Temp 02/02/24 0939 98.5 F (36.9 C)     Temp Source 02/02/24 0939 Oral     SpO2 02/02/24 0939 93 %     Weight 02/02/24 0936 151 lb (68.5 kg)     Height 02/02/24 0936 5' 6 (1.676 m)     Head Circumference --      Peak Flow --      Pain Score 02/02/24 0936 8     Pain Loc --      Pain Education --      Exclude from Growth Chart --    No data found.  Updated Vital Signs BP 122/66 (BP Location: Right Arm)   Pulse 81   Temp 98.5 F (36.9 C) (Oral)   Resp 20   Ht 5' 6 (1.676 m)   Wt 68.5 kg   SpO2 93%   BMI 24.37 kg/m       Physical Exam Constitutional:      General: She is not in acute distress.    Appearance: She is well-developed and normal weight.  HENT:     Head: Normocephalic and atraumatic.     Right Ear: Tympanic membrane normal.     Nose: Congestion and rhinorrhea present.     Comments: Facial sinuses are tender    Mouth/Throat:     Pharynx: Posterior oropharyngeal erythema present.  Eyes:     Conjunctiva/sclera: Conjunctivae normal.     Pupils: Pupils are equal, round, and reactive to light.  Cardiovascular:     Rate and Rhythm: Normal rate and regular rhythm.     Heart sounds: Normal heart sounds.  Pulmonary:     Effort: Pulmonary effort is normal. No respiratory distress.     Breath sounds: Normal breath sounds.  Musculoskeletal:        General: Normal range of motion.     Cervical back: Normal range of motion.  Lymphadenopathy:     Cervical: Cervical adenopathy present.  Skin:    General: Skin is warm and dry.  Neurological:     Mental Status: She is alert.      UC Treatments / Results  Labs (all labs ordered  are listed,  but only abnormal results are displayed) Labs Reviewed - No data to display  EKG   Radiology No results found.  Procedures Procedures (including critical care time)  Medications Ordered in UC Medications - No data to display  Initial Impression / Assessment and Plan / UC Course  I have reviewed the triage vital signs and the nursing notes.  Pertinent labs & imaging results that were available during my care of the patient were reviewed by me and considered in my medical decision making (see chart for details).     Discussed with patient that most sinus and upper respiratory infections are caused by viruses.  She likely proved with time, fluids, over-the-counter medications.  Patient believes because her husband is on antibiotics, and improving, that she would like a trial of amoxicillin  Final Clinical Impressions(s) / UC Diagnoses   Final diagnoses:  Acute bacterial sinusitis     Discharge Instructions      Make sure you are drinking lots of water Continue sinus rinses, as tolerated Take Augmentin  2 times a day for a week.  Take 2 doses today Start prednisone  once a day for 5 days See your doctor if not improving by next week     ED Prescriptions     Medication Sig Dispense Auth. Provider   amoxicillin -clavulanate (AUGMENTIN ) 875-125 MG tablet Take 1 tablet by mouth every 12 (twelve) hours. 14 tablet Maranda Jamee Jacob, MD   predniSONE  (DELTASONE ) 20 MG tablet Take 2 tablets (40 mg total) by mouth daily with breakfast. 10 tablet Maranda Jamee Jacob, MD      PDMP not reviewed this encounter.     [1]  Social History Tobacco Use   Smoking status: Never   Smokeless tobacco: Never  Vaping Use   Vaping status: Never Used  Substance Use Topics   Alcohol use: Not Currently   Drug use: No     Maranda Jamee Jacob, MD 02/02/24 1138

## 2024-02-06 ENCOUNTER — Emergency Department (HOSPITAL_BASED_OUTPATIENT_CLINIC_OR_DEPARTMENT_OTHER): Payer: Self-pay

## 2024-02-06 ENCOUNTER — Other Ambulatory Visit: Payer: Self-pay

## 2024-02-06 ENCOUNTER — Ambulatory Visit: Payer: Self-pay

## 2024-02-06 ENCOUNTER — Emergency Department (HOSPITAL_BASED_OUTPATIENT_CLINIC_OR_DEPARTMENT_OTHER)
Admission: EM | Admit: 2024-02-06 | Discharge: 2024-02-06 | Disposition: A | Discharge status: Home / Self Care | Payer: Self-pay | Source: Ambulatory Visit | Attending: Emergency Medicine | Admitting: Emergency Medicine

## 2024-02-06 ENCOUNTER — Encounter (HOSPITAL_BASED_OUTPATIENT_CLINIC_OR_DEPARTMENT_OTHER): Payer: Self-pay | Admitting: Emergency Medicine

## 2024-02-06 DIAGNOSIS — R1084 Generalized abdominal pain: Secondary | ICD-10-CM | POA: Insufficient documentation

## 2024-02-06 DIAGNOSIS — Z91010 Allergy to peanuts: Secondary | ICD-10-CM | POA: Insufficient documentation

## 2024-02-06 DIAGNOSIS — R059 Cough, unspecified: Secondary | ICD-10-CM | POA: Insufficient documentation

## 2024-02-06 DIAGNOSIS — Z91040 Latex allergy status: Secondary | ICD-10-CM | POA: Insufficient documentation

## 2024-02-06 DIAGNOSIS — R079 Chest pain, unspecified: Secondary | ICD-10-CM | POA: Insufficient documentation

## 2024-02-06 DIAGNOSIS — R197 Diarrhea, unspecified: Secondary | ICD-10-CM | POA: Insufficient documentation

## 2024-02-06 LAB — COMPREHENSIVE METABOLIC PANEL WITH GFR
ALT: 23 U/L (ref 0–44)
AST: 22 U/L (ref 15–41)
Albumin: 4.6 g/dL (ref 3.5–5.0)
Alkaline Phosphatase: 46 U/L (ref 38–126)
Anion gap: 10 (ref 5–15)
BUN: 14 mg/dL (ref 8–23)
CO2: 27 mmol/L (ref 22–32)
Calcium: 10.1 mg/dL (ref 8.9–10.3)
Chloride: 104 mmol/L (ref 98–111)
Creatinine, Ser: 0.66 mg/dL (ref 0.44–1.00)
GFR, Estimated: >60 mL/min (ref >60)
Glucose, Bld: 97 mg/dL (ref 70–99)
Potassium: 3.9 mmol/L (ref 3.5–5.1)
Sodium: 141 mmol/L (ref 135–145)
Total Bilirubin: 0.5 mg/dL (ref 0.0–1.2)
Total Protein: 7.7 g/dL (ref 6.5–8.1)

## 2024-02-06 LAB — URINALYSIS, ROUTINE W REFLEX MICROSCOPIC
Bilirubin Urine: NEGATIVE
Glucose, UA: NEGATIVE mg/dL
Hgb urine dipstick: NEGATIVE
Ketones, ur: NEGATIVE mg/dL
Leukocytes,Ua: NEGATIVE
Nitrite: NEGATIVE
Specific Gravity, Urine: 1.024 (ref 1.005–1.030)
pH: 6 (ref 5.0–8.0)

## 2024-02-06 LAB — LIPASE, BLOOD: Lipase: 21 U/L (ref 11–51)

## 2024-02-06 LAB — OCCULT BLOOD X 1 CARD TO LAB, STOOL: Fecal Occult Bld: NEGATIVE

## 2024-02-06 LAB — CBC
HCT: 42.6 % (ref 36.0–46.0)
Hemoglobin: 14.4 g/dL (ref 12.0–15.0)
MCH: 31.6 pg (ref 26.0–34.0)
MCHC: 33.8 g/dL (ref 30.0–36.0)
MCV: 93.6 fL (ref 80.0–100.0)
Platelets: 230 K/uL (ref 150–400)
RBC: 4.55 MIL/uL (ref 3.87–5.11)
RDW: 12.7 % (ref 11.5–15.5)
WBC: 5.9 K/uL (ref 4.0–10.5)
nRBC: 0 % (ref 0.0–0.2)

## 2024-02-06 MED ORDER — SODIUM CHLORIDE 0.9 % IV BOLUS
1000.0000 mL | Freq: Once | INTRAVENOUS | Status: AC
  Administered 2024-02-06: 15:00:00 1000 mL via INTRAVENOUS

## 2024-02-06 MED ORDER — AZITHROMYCIN 250 MG PO TABS: ORAL_TABLET | ORAL | 0 refills | Status: AC

## 2024-02-06 MED ORDER — ONDANSETRON HCL 4 MG/2ML IJ SOLN
4.0000 mg | Freq: Once | INTRAMUSCULAR | Status: DC
  Filled 2024-02-06: qty 2

## 2024-02-06 NOTE — Discharge Instructions (Signed)
 As we discussed, stop taking the prednisone  and augmentin . You can replace the antibiotic for the sinusitis/bronchitis. Please follow up with your doctor for close recheck and return to the ED if symptoms worsen.   If having more than 5 bowel movements daily, use Imodium. Do not treat if having less than 5.

## 2024-02-06 NOTE — ED Notes (Signed)
 Reviewed discharge instructions, medications, and home care with pt. Pt verbalized understanding and had no further questions. Pt exited ED without complications.

## 2024-02-06 NOTE — Telephone Encounter (Signed)
 FYI Only or Action Required?: FYI only for provider: ED advised.  Patient was last seen in primary care on 04/17/2023 by Job Lukes, PA.  Called Nurse Triage reporting Shortness of Breath and Abdominal Pain.  Symptoms began several weeks ago.  Interventions attempted: Prescription medications: augmentin , prednisone , inhaler.  Symptoms are: gradually worsening.  Triage Disposition: Go to ED Now (Notify PCP)  Patient/caregiver understands and will follow disposition?: Yes    Copied from CRM [id number]. Topic: Clinical - Prescription Issue >> Feb 06, 2024  8:54 AM Zy'onna H wrote: Reason for RMF:Ejupzwu was rushed to UC: 02/02/2024 Prescribed:  amoxicillin -clavulanate (AUGMENTIN ) 875-125 MG tablet Adelaida, MD)  Treated for: Severe Sinusitis/Bronchitis   Causing her (stomach lining issues) - Upper right pain in her ribs.  - Pain cramping/ soreness within her stomach - Patient does not have a gallbladder.  - Distention of the belly (like 9 months pregnant)    She is also on: predniSONE  (DELTASONE ) 20 MG tablet (Stool is whitish grey/greasy stool)    **Transf. To NT** Reason for Disposition  [1] MODERATE difficulty breathing (e.g., speaks in phrases, SOB even at rest, pulse 100-120) AND [2] NEW-onset or WORSE than normal  Answer Assessment - Initial Assessment Questions Pt's complaints were about meds she was given causing her GI problems. As patient is describing her symptoms Rn asked if the way she is taking is new for her or her normal. She states is new since she has the flu in November. She is stopping mid sentence to take a break and deep breath. Rn asked if she is having chest pain with this. Rn advised given symptoms, recommendations are for her to go to the ER not UC (she went this weekend) bc of the resources they have such as Cts. RN advised not to negate her other symptoms but her breathing is concerning and would like that addressed, since the meds given for her  bronchitis might be causing the GI issues. Pt stated understanding. Due to pt's breathing, no further triage questions asked at this time.   Gi symptoms- nausea, gray, yellow greasy stools after eating, excessive bloating, right upper abdominal pain. Pt is concerned it is her pancreas. She has had a couple friends with similar symptoms who ended up with pancreatic cancer. She does not have a gallbladder.     1. RESPIRATORY STATUS: Describe your breathing? (e.g., wheezing, shortness of breath, unable to speak, severe coughing)      Shortness of breath 2. ONSET: When did this breathing problem begin?      November 3. PATTERN Does the difficult breathing come and go, or has it been constant since it started?      Constant since then 4. SEVERITY: How bad is your breathing? (e.g., mild, moderate, severe)      moderate 5. RECURRENT SYMPTOM: Have you had difficulty breathing before? If Yes, ask: When was the last time? and What happened that time?       6. CARDIAC HISTORY: Do you have any history of heart disease? (e.g., heart attack, angina, bypass surgery, angioplasty)       7. LUNG HISTORY: Do you have any history of lung disease?  (e.g., pulmonary embolus, asthma, emphysema)      8. CAUSE: What do you think is causing the breathing problem?       9. OTHER SYMPTOMS: Do you have any other symptoms? (e.g., chest pain, cough, dizziness, fever, runny nose)     Chest pain, coughing up mucus and blood, dizziness,  gi symptoms 10. O2 SATURATION MONITOR:  Do you use an oxygen saturation monitor (pulse oximeter) at home? If Yes, ask: What is your reading (oxygen level) today? What is your usual oxygen saturation reading? (e.g., 95%)  Protocols used: Breathing Difficulty-A-AH

## 2024-02-06 NOTE — ED Triage Notes (Signed)
 Pt caox4 c/o light color diarrhea and BM with dark urine x3 days, currently on abx for sinusitis which developed after being dx with flu. Pt further reports pt's PCP told her to get eval for worsening productive cough and burning pain in chest to r/o pneumonia.

## 2024-02-06 NOTE — ED Notes (Signed)
 ED Provider at bedside.

## 2024-02-06 NOTE — ED Provider Notes (Signed)
 [place] EMERGENCY DEPARTMENT AT [facility] Provider Note   CSN: [id number] Arrival date & time: 02/06/24  1102     Patient presents with: No chief complaint on file.   Deborah Callahan is a 63 y.o. female.   Patient to ED for evaluation of copious diarrhea for the past 3 days she describes as discolored but nonbloody. She feels her symptoms begin with eating any sort of food, followed by nausea and abdominal bloating, followed by diarrhea. No fever. She has vomited one time only. She also reports chest burning type pain in the center of her chest associated with breathing and cough. She was seen by Dr. Neysa 12/14 and started on Augmentin  and prednisone  for bronchitis and bacterial sinusitis with symptoms of bloody nasal discharge and mucosal swelling.   The history is provided by the patient. No language interpreter was used.       Prior to Admission medications  Medication Sig Start Date End Date Taking? Authorizing Provider  azithromycin  (ZITHROMAX  Z-PAK) 250 MG tablet Take 2 tablets on day 1 Take 1 tablet on days 2 through 5 02/06/24  Yes Kelsea Mousel, Margit, PA-C  albuterol  (VENTOLIN  HFA) 108 (90 Base) MCG/ACT inhaler Inhale 2 puffs into the lungs every 6 (six) hours as needed for wheezing or shortness of breath (Cough). 02/16/22   Joesph Shaver Scales, PA-C  alendronate  (FOSAMAX ) 70 MG tablet TAKE 1 TABLET BY MOUTH ONCE A WEEK IN THE MORNING 07/04/22   Plotnikov, Aleksei V, MD  amoxicillin -clavulanate (AUGMENTIN ) 875-125 MG tablet Take 1 tablet by mouth every 12 (twelve) hours. 02/02/24   Maranda Jamee Jacob, MD  cetirizine  (ZYRTEC  ALLERGY) 10 MG tablet Take 1 tablet (10 mg total) by mouth at bedtime. Patient not taking: Reported on 10/15/2022 06/22/22 12/19/22  Job Lukes, PA  Cyanocobalamin (VITAMIN B-12) 5000 MCG SUBL Take 5,000 mcg by mouth in the morning.    [provider]  dicyclomine  (BENTYL ) 20 MG tablet TAKE 1 TABLET BY MOUTH EVERY 6 HOURS AS  NEEDED (ABDOMINAL CRAMPING PAIN). 06/04/22   Armbruster, Elspeth SQUIBB, MD  diphenoxylate -atropine  (LOMOTIL ) 2.5-0.025 MG tablet Take 1 tablet by mouth 4 (four) times daily as needed for diarrhea or loose stools. Patient not taking: Reported on 04/17/2023 07/04/22   Plotnikov, Karlynn GAILS, MD  guaifenesin  (HUMIBID E) 400 MG TABS tablet Take 1 tablet 3 times daily as needed for chest congestion and cough 02/16/22   Joesph Shaver Scales, PA-C  ibuprofen  (ADVIL ) 400 MG tablet Take 1 tablet (400 mg total) by mouth every 8 (eight) hours as needed for up to 30 doses. 02/16/22   Joesph Shaver Scales, PA-C  levothyroxine  (SYNTHROID ) 88 MCG tablet Take 1 tablet (88 mcg total) by mouth daily. 11/27/22   Shamleffer, Ibtehal Jaralla, MD  LYSINE PO Take 1 Scoop by mouth daily.    [provider]  MAGNESIUM CITRATE PO Take 500 mg by mouth daily. 250 mg/tablet    [provider]  MELATONIN PO Take 6 mg by mouth at bedtime as needed (sleep).    [provider]  Multiple Vitamins-Minerals (ADULT ONE DAILY GUMMIES PO) Take 2 tablets by mouth daily. Vitafusion MultiVites    [provider]  OVER THE COUNTER MEDICATION Pt taking miralax  every day    [provider]  Polyethyl Glycol-Propyl Glycol (SYSTANE) 0.4-0.3 % SOLN Place 1-2 drops into both eyes 3 (three) times daily as needed (dry/irritated eyes.).    [provider]  predniSONE  (DELTASONE ) 20 MG tablet Take 2 tablets (  40 mg total) by mouth daily with breakfast. 02/02/24   Maranda Jamee Jacob, MD  TURMERIC-GINGER PO Take 2 tablets by mouth daily. Gummies    [provider]    Allergies: Codeine, Oxycodone -acetaminophen , Soybean-containing drug products, Tramadol, Wheat, Almond (diagnostic), Bismuth-containing compounds, Buspar  [buspirone ], Carafate  [sucralfate ], Corn-containing products, Egg protein-containing drug products, Gluten meal, Lactose intolerance (gi), Latex, Lentil, Omeprazole , Peanuts [peanut  oil], and Pepcid  [famotidine ]    Review of Systems  Updated Vital Signs BP 132/75   Pulse 78   Temp 98.3 F (36.8 C)   Resp 18   SpO2 99%   Physical Exam Vitals and nursing note reviewed.  Constitutional:      Appearance: She is well-developed.  HENT:     Head: Normocephalic.  Cardiovascular:     Rate and Rhythm: Normal rate and regular rhythm.     Heart sounds: No murmur heard. Pulmonary:     Effort: Pulmonary effort is normal.     Breath sounds: Normal breath sounds. No wheezing, rhonchi or rales.  Abdominal:     General: Bowel sounds are increased. There is distension (slightly).     Palpations: Abdomen is soft.     Tenderness: There is generalized abdominal tenderness. There is no right CVA tenderness, left CVA tenderness, guarding or rebound.  Musculoskeletal:        General: Normal range of motion.     Cervical back: Normal range of motion and neck supple.  Skin:    General: Skin is warm and dry.  Neurological:     General: No focal deficit present.     Mental Status: She is alert and oriented to person, place, and time.     (all labs ordered are listed, but only abnormal results are displayed) Labs Reviewed  URINALYSIS, ROUTINE W REFLEX MICROSCOPIC - Abnormal; Notable for the following components:      Result Value   Protein, ur TRACE (*)    All other components within normal limits  GASTROINTESTINAL PANEL BY PCR, STOOL (REPLACES STOOL CULTURE)  LIPASE, BLOOD  COMPREHENSIVE METABOLIC PANEL WITH GFR  CBC  OCCULT BLOOD X 1 CARD TO LAB, STOOL    EKG: None  Radiology: DG Abdomen Acute W/Chest Result Date: 02/06/2024 CLINICAL DATA:  Cough and abdominal pain. EXAM: DG ABDOMEN ACUTE WITH 1 VIEW CHEST COMPARISON:  Chest x-ray 03/22/2023 and CT abdomen 10/12/2023 FINDINGS: Lungs are adequately inflated and otherwise clear. Cardiomediastinal silhouette and remainder of the chest is unchanged. Abdominopelvic images demonstrate surgical clips over the right upper  quadrant compatible prior cholecystectomy. Bowel gas pattern is nonobstructive. No free peritoneal air. Mild degenerate change of the spine. Two adjacent tubular metallic densities over the pelvis likely artifact. IMPRESSION: 1. No acute cardiopulmonary disease. 2. Nonobstructive bowel gas pattern. Electronically Signed   By: Toribio Agreste M.D.   On: 02/06/2024 13:30     Procedures   Medications Ordered in the ED  ondansetron  (ZOFRAN ) injection 4 mg (4 mg Intravenous Patient Refused/Not Given 02/06/24 1408)  sodium chloride  0.9 % bolus 1,000 mL (0 mLs Intravenous Stopped 02/06/24 1551)    Clinical Course as of 02/06/24 1555  Thu Feb 06, 2024  1553 Patient to ED with c/o copious diarrhea for the past 3 days. Nausea without vomiting. No fever. Sxs worse with solid oral intake.   She is very well appearing. Her labs are reviewed by me and are WNL. Urine is concentrated without other signs of dehydration. IV fluids provided. GI panel pending.   She started  Augmentin  and prednisone  one day prior to symptoms of diarrhea. Feel this is potentially a contributing factor. She states she has had zithromax  in the past without unwanted side effects. Will stop Augmentin  and start Z-Pack. Encouraged close PCP recheck and cautions to return to ED. [SU]    Clinical Course User Index [SU] Odell Balls, PA-C                                 Medical Decision Making Amount and/or Complexity of Data Reviewed Labs: ordered. Radiology: ordered.  Risk Prescription drug management.        Final diagnoses:  Diarrhea, unspecified type    ED Discharge Orders          Ordered    azithromycin  (ZITHROMAX  Z-PAK) 250 MG tablet        02/06/24 1549               Odell Balls, PA-C 02/06/24 1555

## 2024-02-06 NOTE — ED Notes (Signed)
 Pt declined zofran  at this time.  She would like to wait to speak with the provider and try eating something before taking anti-nausea medicine in case I'm just nauseous because I'm hungry.

## 2024-02-07 ENCOUNTER — Ambulatory Visit: Payer: Self-pay

## 2024-02-07 LAB — GASTROINTESTINAL PANEL BY PCR, STOOL (REPLACES STOOL CULTURE)

## 2024-02-07 NOTE — Telephone Encounter (Signed)
 FYI Only or Action Required?: FYI only for provider: ED advised.  Patient was last seen in primary care on 04/17/2023 by Job Lukes, PA.  Called Nurse Triage reporting Abdominal Pain.  Symptoms began several days ago.  Interventions attempted: Rest, hydration, or home remedies.  Symptoms are: gradually worsening.  Triage Disposition: Go to ED Now (Notify PCP)  Patient/caregiver understands and will follow disposition?: Yes   Copied from CRM #[id number]. Topic: Clinical - Red Word Triage >> Feb 07, 2024  1:58 PM Deborah Callahan wrote: Red Word that prompted transfer to Nurse Triage: Patient called in to speak with [facility] for clarification. Prescribed azithromycin  (ZITHROMAX  Z-PAK) 250 MG but now is causing Severe pain in LRQ & also causing nausea Reason for Disposition  [1] SEVERE pain (e.g., excruciating) AND [2] present > 1 hour  Answer Assessment - Initial Assessment Questions Patient reports severe abdominal pain with abdominal distention. Patient recommended to the ED again for further evaluation.   1. LOCATION: Where does it hurt?      Upper abdominal pain and lower right quadrant 2. RADIATION: Does the pain shoot anywhere else? (e.g., chest, back)     No radiation 3. ONSET: When did the pain begin? (e.g., minutes, hours or days ago)      Started on Sunday 4. SUDDEN: Gradual or sudden onset?     sudden 5. PATTERN Does the pain come and go, or is it constant?     constant 6. SEVERITY: How bad is the pain?  (e.g., Scale 1-10; mild, moderate, or severe)     10  7. RECURRENT SYMPTOM: Have you ever had this type of stomach pain before? If Yes, ask: When was the last time? and What happened that time?      yes 8. AGGRAVATING FACTORS: Does anything seem to cause this pain? (e.g., foods, stress, alcohol)     unsure 9. CARDIAC SYMPTOMS: Do you have any of the following symptoms: chest pain, difficulty breathing, sweating, nausea?     nausea 10. OTHER  SYMPTOMS: Do you have any other symptoms? (e.g., back pain, diarrhea, fever, urination pain, vomiting)       no  Protocols used: Abdominal Pain - Upper-A-AH

## 2024-02-10 NOTE — Telephone Encounter (Signed)
 Pt seen in ED on 02/06/24

## 2024-03-04 ENCOUNTER — Encounter: Payer: Self-pay | Admitting: Family Medicine

## 2024-03-04 ENCOUNTER — Ambulatory Visit (INDEPENDENT_AMBULATORY_CARE_PROVIDER_SITE_OTHER): Payer: Self-pay | Admitting: Family Medicine

## 2024-03-04 ENCOUNTER — Ambulatory Visit: Payer: Self-pay

## 2024-03-04 VITALS — BP 112/60 | HR 79 | Temp 98.4°F | Wt 157.6 lb

## 2024-03-04 DIAGNOSIS — J069 Acute upper respiratory infection, unspecified: Secondary | ICD-10-CM

## 2024-03-04 NOTE — Progress Notes (Signed)
 "  Established Patient Office Visit  Subjective   Patient ID: Deborah Callahan, female    DOB: Nov 23, 1960  Age: 64 y.o. MRN: [id number]  Chief Complaint  Patient presents with   Cough    HPI   Lawrnce is seen today with upper respiratory symptoms which started about 3 days ago.  She states she had influenza several weeks ago but seem to be over that.  She has had multiple symptoms including sore throat, cough, nasal congestion and left greater than right earache.  Symptoms may be just slightly better today.  She has had some soreness especially right anterior neck.  Has not documented any fevers and no major chills.  No known sick contacts.  Past Medical History:  Diagnosis Date   Arthritis    degenerative cerv. spine , beginning signs of carpal tunnel syndrome- L hand     Complication of anesthesia    Cystocele    Environmental and seasonal allergies    Family history of anesthesia complication    N&V- mother & sister   GERD (gastroesophageal reflux disease)    DIET CONTROLLED   Headache(784.0)    migraines    History of hiatal hernia    History of kidney stones    Hypothyroidism    IBS (irritable bowel syndrome)    IC (interstitial cystitis)    Mild intermittent asthma    Pelvic relaxation    Pneumonia    PONV (postoperative nausea and vomiting)    Wears glasses    Past Surgical History:  Procedure Laterality Date   ABDOMINAL HYSTERECTOMY     partial hysterectomy   ANTERIOR AND POSTERIOR REPAIR  09/23/2014   Procedure: ANTERIOR (CYSTOCELE) AND POSTERIOR REPAIR (RECTOCELE);  Surgeon: Norleen Skill, MD;  Location: [facility];  Service: Gynecology;;   ANTERIOR CERVICAL DECOMP/DISCECTOMY FUSION N/A 05/08/2012   Procedure: ACDF C5-7  ANTERIOR CERVICAL DECOMPRESSION/DISCECTOMY FUSION 2 LEVELS;  Surgeon: Donaciano Sprang, MD;  Location: [facility];  Service: Orthopedics;  Laterality: N/A;   ANTERIOR CERVICAL DECOMP/DISCECTOMY FUSION N/A 02/09/2019    Procedure: ANTERIOR CERVICAL DECOMPRESSION FUSION CERVICAL 3-4, CERVICAL 4-5 WITH INSTRUMENTATION AND ALLOGRAFT;  Surgeon: Beuford Anes, MD;  Location: [facility] OR;  Service: Orthopedics;  Laterality: N/A;   BIOPSY  10/17/2021   Procedure: BIOPSY;  Surgeon: Leigh Elspeth SQUIBB, MD;  Location: [facility];  Service: Gastroenterology;;   COLONOSCOPY  10/19/2005   COLONOSCOPY WITH PROPOFOL  N/A 10/17/2021   Procedure: COLONOSCOPY WITH PROPOFOL ;  Surgeon: Leigh Elspeth SQUIBB, MD;  Location: [facility];  Service: Gastroenterology;  Laterality: N/A;   D & C HYSTEROSCOPE/ RESECTION POLYP/  POSTERIOR AND ENTEROOCELE REPAIRS/  CARDINAL UTEROSACRAL COLPOSUSPENSION /  PERINEAL BODY REPAIR  04/22/2003   ESOPHAGOGASTRODUODENOSCOPY (EGD) WITH PROPOFOL  N/A 10/17/2021   Procedure: ESOPHAGOGASTRODUODENOSCOPY (EGD) WITH PROPOFOL ;  Surgeon: Leigh Elspeth SQUIBB, MD;  Location: [facility];  Service: Gastroenterology;  Laterality: N/A;   FRACTURE SURGERY Right 2005   Right shoulder with Dr. Gerome at [facility]   LAPAROSCOPIC ASSISTED VAGINAL HYSTERECTOMY  11/02/2004   w/  Release posterior vaginal scar   LAPAROSCOPIC CHOLECYSTECTOMY  10/11/2001   POLYPECTOMY  10/17/2021   Procedure: POLYPECTOMY;  Surgeon: Leigh Elspeth SQUIBB, MD;  Location: [facility];  Service: Gastroenterology;;   HARLEY DILATION N/A 10/17/2021   Procedure: HARLEY DILATION;  Surgeon: Leigh Elspeth SQUIBB, MD;  Location: [facility];  Service: Gastroenterology;  Laterality: N/A;   TOOTH EXTRACTION     #31   TUBAL LIGATION  1988  reports that she has never smoked. She has never used smokeless tobacco. She reports that she does not currently use alcohol. She reports that she does not use drugs. family history includes Arthritis/Rheumatoid in her mother; Cancer in her maternal grandmother, mother, and sister; Diabetes in her mother and paternal grandfather. Allergies[1]  Review of Systems  Constitutional:  Negative for chills and  fever.  HENT:  Positive for congestion and sore throat.   Respiratory:  Positive for cough.   Cardiovascular:  Negative for chest pain.      Objective:     BP 112/60   Pulse 79   Temp 98.4 F (36.9 C) (Oral)   Wt 157 lb 9.6 oz (71.5 kg)   SpO2 96%   BMI 25.44 kg/m  BP Readings from Last 3 Encounters:  03/04/24 112/60  02/06/24 132/75  02/02/24 122/66   Wt Readings from Last 3 Encounters:  03/04/24 157 lb 9.6 oz (71.5 kg)  02/02/24 151 lb (68.5 kg)  04/17/23 159 lb 12.8 oz (72.5 kg)      Physical Exam Vitals reviewed.  Constitutional:      General: She is not in acute distress.    Appearance: She is not ill-appearing.  HENT:     Left Ear: Tympanic membrane normal.     Ears:     Comments: She has some cerumen which partially obscures right eardrum.    Mouth/Throat:     Mouth: Mucous membranes are moist.     Pharynx: Oropharynx is clear. No oropharyngeal exudate or posterior oropharyngeal erythema.  Cardiovascular:     Rate and Rhythm: Normal rate and regular rhythm.  Pulmonary:     Effort: Pulmonary effort is normal.     Breath sounds: Normal breath sounds. No wheezing or rales.  Musculoskeletal:     Cervical back: Neck supple.  Lymphadenopathy:     Cervical: No cervical adenopathy.  Neurological:     Mental Status: She is alert.      No results found for any visits on 03/04/24.    The 10-year ASCVD risk score (Arnett DK, et al., 2019) is: 3.3%    Assessment & Plan:   Probable viral URI with cough.  Nonfocal exam.  Patient nontoxic in appearance.  Recommend plenty of fluids and rest and observe for now.  Follow-up promptly for any fever or worsening symptoms.  Wolm Scarlet, MD     [1]  Allergies Allergen Reactions   Codeine Anaphylaxis, Hives and Itching   Oxycodone -Acetaminophen  Hives, Shortness Of Breath, Itching and Swelling   Soybean-Containing Drug Products Hives, Nausea And Vomiting and Swelling   Tramadol Hives, Nausea And Vomiting  and Swelling   Wheat Anaphylaxis, Hives, Diarrhea and Swelling   Almond (Diagnostic) Hives, Diarrhea and Nausea And Vomiting   Bismuth-Containing Compounds Other (See Comments)    Ulcer (pt to avoid due to md instruction)   Buspar  [Buspirone ] Hives and Itching    Bleeding in scalp   Carafate  [Sucralfate ] Hives, Itching and Nausea And Vomiting    Bleeding in scalp, headaches   Corn-Containing Products Diarrhea   Egg Protein-Containing Drug Products Hives, Diarrhea and Nausea And Vomiting    Tolerated propofol  on multiple occasions with no difficulty   Gluten Meal     Other reaction(s): Unknown   Lactose Intolerance (Gi) Hives, Diarrhea and Nausea And Vomiting   Latex Itching and Other (See Comments)    Skin irritation/ reddness   Lentil Diarrhea and Swelling    Bowel irritation    Omeprazole   Hives and Other (See Comments)    Multiple intolerances -- decrease in energy, headaches, neuropathy in hands, throat irritation, changes in appetite, muscle aches, itchiness accompanied by widespread tiny blisters over her body and in her mouth   Peanuts [Peanut Oil] Hives, Diarrhea and Nausea And Vomiting   Pepcid  [Famotidine ] Other (See Comments)    Unknown    "

## 2024-03-04 NOTE — Telephone Encounter (Signed)
 Noted, appt today.

## 2024-03-04 NOTE — Telephone Encounter (Signed)
 FYI Only or Action Required?: FYI only for provider: appointment scheduled on 03/04/24.  Patient was last seen in primary care on 04/17/2023 by Job Lukes, PA.  Called Nurse Triage reporting Shortness of Breath, Cough, Sore Throat, Chest Pain, and Lymphadenopathy.  Symptoms began several days ago.  Interventions attempted: Other: Netipot, guaifenesin , Tylenol , fluids, Advil  .  Symptoms are: gradually worsening.  Triage Disposition: See HCP Within 4 Hours (Or PCP Triage)  Patient/caregiver understands and will follow disposition?: No            Copied from CRM #[id number]. Topic: Clinical - Red Word Triage >> Mar 04, 2024  8:30 AM Treva T wrote: [facility] that prompted transfer to Nurse Triage: Pt calling states, she is having right side throat pain, feels like a knot is in her throat, with productive cough, with colored mucous, chest congestion, post nasal drainage.  Pt reports she has been taking guaifenesin , but not helping.   Pt is requesting appt for evaluation. Reason for Disposition  [1] MILD difficulty breathing (e.g., minimal/no SOB at rest, SOB with walking, pulse < 100) AND [2] NEW-onset or WORSE than normal  Answer Assessment - Initial Assessment Questions 1. RESPIRATORY STATUS: Describe your breathing? (e.g., wheezing, shortness of breath, unable to speak, severe coughing)      SOB, productive cough, chest tightness.  2. ONSET: When did this breathing problem begin?      Monday.  3. PATTERN Does the difficult breathing come and go, or has it been constant since it started?      SOB with activity, not at rest or while talking.   4. SEVERITY: How bad is your breathing? (e.g., mild, moderate, severe)      Mild.  5. RECURRENT SYMPTOM: Have you had difficulty breathing before? If Yes, ask: When was the last time? and What happened that time?      Yes. She states this happens anytime she gets respiratory infections. Recently sick in December and  seen in ED treated with Azithromycin  5 days.  6. CARDIAC HISTORY: Do you have any history of heart disease? (e.g., heart attack, angina, bypass surgery, angioplasty)      No per chart review.  7. LUNG HISTORY: Do you have any history of lung disease?  (e.g., pulmonary embolus, asthma, emphysema)     Lung scarring, pneumonia.  8. CAUSE: What do you think is causing the breathing problem?      Unsure.  9. OTHER SYMPTOMS: Do you have any other symptoms? (e.g., chest pain, cough, dizziness, fever, runny nose)     Vomiting, felt feverish, chills Sunday thru Tuesday morning (resolved now). Tolerating PO fluids. Chest tightness (similar to respiratory infections), SOB with activity not when talking, sinus bloody- green discharge, post nasal drip, productive cough, right sided throat pain, swollen lymph node on side of neck and painful. Right sided rib pain when taking a deep breath.   10. O2 SATURATION MONITOR:  Do you use an oxygen saturation monitor (pulse oximeter) at home? If Yes, ask: What is your reading (oxygen level) today? What is your usual oxygen saturation reading? (e.g., 95%)       No.  11. PREGNANCY: Is there any chance you are pregnant? When was your last menstrual period?       N/A.  12. TRAVEL: Have you traveled out of the country in the last month? (e.g., travel history, exposures)       No travel. Exposure: visited family over the weekend. In laws were sick with sneezing/coughing/nasal  congestion. Mother and brother had stomach bug.  Protocols used: Breathing Difficulty-A-AH

## 2024-03-19 ENCOUNTER — Other Ambulatory Visit: Payer: Self-pay | Admitting: Obstetrics and Gynecology

## 2024-03-19 DIAGNOSIS — N644 Mastodynia: Secondary | ICD-10-CM
